# Patient Record
Sex: Female | Born: 1940 | Race: Black or African American | Hispanic: No | Marital: Married | Smoking: Former smoker
Health system: Southern US, Community
[De-identification: ages and names within clinical notes are randomized; demographics above are authoritative.]

## PROBLEM LIST (undated history)

## (undated) DIAGNOSIS — I471 Supraventricular tachycardia, unspecified: Secondary | ICD-10-CM

## (undated) DIAGNOSIS — T783XXA Angioneurotic edema, initial encounter: Secondary | ICD-10-CM

## (undated) DIAGNOSIS — I890 Lymphedema, not elsewhere classified: Secondary | ICD-10-CM

## (undated) DIAGNOSIS — R000 Tachycardia, unspecified: Secondary | ICD-10-CM

## (undated) DIAGNOSIS — J069 Acute upper respiratory infection, unspecified: Secondary | ICD-10-CM

## (undated) DIAGNOSIS — Z9889 Other specified postprocedural states: Secondary | ICD-10-CM

## (undated) DIAGNOSIS — J988 Other specified respiratory disorders: Secondary | ICD-10-CM

## (undated) DIAGNOSIS — N183 Chronic kidney disease, stage 3 unspecified: Secondary | ICD-10-CM

## (undated) DIAGNOSIS — M1990 Unspecified osteoarthritis, unspecified site: Secondary | ICD-10-CM

## (undated) DIAGNOSIS — M810 Age-related osteoporosis without current pathological fracture: Secondary | ICD-10-CM

## (undated) DIAGNOSIS — E559 Vitamin D deficiency, unspecified: Secondary | ICD-10-CM

## (undated) DIAGNOSIS — R42 Dizziness and giddiness: Secondary | ICD-10-CM

## (undated) DIAGNOSIS — E119 Type 2 diabetes mellitus without complications: Secondary | ICD-10-CM

## (undated) DIAGNOSIS — T4145XA Adverse effect of unspecified anesthetic, initial encounter: Secondary | ICD-10-CM

## (undated) DIAGNOSIS — M8580 Other specified disorders of bone density and structure, unspecified site: Secondary | ICD-10-CM

## (undated) DIAGNOSIS — L309 Dermatitis, unspecified: Secondary | ICD-10-CM

## (undated) DIAGNOSIS — K219 Gastro-esophageal reflux disease without esophagitis: Secondary | ICD-10-CM

## (undated) DIAGNOSIS — G629 Polyneuropathy, unspecified: Secondary | ICD-10-CM

## (undated) DIAGNOSIS — R202 Paresthesia of skin: Secondary | ICD-10-CM

## (undated) DIAGNOSIS — R200 Anesthesia of skin: Secondary | ICD-10-CM

## (undated) DIAGNOSIS — E7800 Pure hypercholesterolemia, unspecified: Secondary | ICD-10-CM

## (undated) DIAGNOSIS — I10 Essential (primary) hypertension: Secondary | ICD-10-CM

## (undated) DIAGNOSIS — R112 Nausea with vomiting, unspecified: Secondary | ICD-10-CM

## (undated) HISTORY — DX: Type 2 diabetes mellitus without complications: E11.9

## (undated) HISTORY — DX: Supraventricular tachycardia: I47.1

## (undated) HISTORY — DX: Age-related osteoporosis without current pathological fracture: M81.0

## (undated) HISTORY — DX: Chronic kidney disease, stage 3 (moderate): N18.3

## (undated) HISTORY — DX: Unspecified osteoarthritis, unspecified site: M19.90

## (undated) HISTORY — DX: Tachycardia, unspecified: R00.0

## (undated) HISTORY — DX: Polyneuropathy, unspecified: G62.9

## (undated) HISTORY — DX: Pure hypercholesterolemia, unspecified: E78.00

## (undated) HISTORY — DX: Acute upper respiratory infection, unspecified: J06.9

## (undated) HISTORY — DX: Lymphedema, not elsewhere classified: I89.0

## (undated) HISTORY — DX: Essential (primary) hypertension: I10

## (undated) HISTORY — DX: Chronic kidney disease, stage 3 unspecified: N18.30

## (undated) HISTORY — DX: Supraventricular tachycardia, unspecified: I47.10

## (undated) HISTORY — DX: Vitamin D deficiency, unspecified: E55.9

## (undated) HISTORY — DX: Other specified disorders of bone density and structure, unspecified site: M85.80

## (undated) HISTORY — DX: Angioneurotic edema, initial encounter: T78.3XXA

## (undated) HISTORY — PX: NASAL SINUS SURGERY: SHX719

---

## 2003-07-21 ENCOUNTER — Encounter: Admission: RE | Admit: 2003-07-21 | Discharge: 2003-10-19 | Payer: Self-pay | Admitting: Family Medicine

## 2003-10-27 ENCOUNTER — Encounter: Admission: RE | Admit: 2003-10-27 | Discharge: 2004-01-25 | Payer: Self-pay | Admitting: Family Medicine

## 2005-05-12 ENCOUNTER — Other Ambulatory Visit: Admission: RE | Admit: 2005-05-12 | Discharge: 2005-05-12 | Payer: Self-pay | Admitting: Family Medicine

## 2006-07-11 ENCOUNTER — Other Ambulatory Visit: Admission: RE | Admit: 2006-07-11 | Discharge: 2006-07-11 | Payer: Self-pay | Admitting: Family Medicine

## 2007-03-16 ENCOUNTER — Emergency Department (HOSPITAL_COMMUNITY): Admission: EM | Admit: 2007-03-16 | Discharge: 2007-03-16 | Payer: Self-pay | Admitting: Emergency Medicine

## 2007-08-11 ENCOUNTER — Emergency Department (HOSPITAL_COMMUNITY): Admission: EM | Admit: 2007-08-11 | Discharge: 2007-08-11 | Payer: Self-pay | Admitting: Emergency Medicine

## 2008-09-17 ENCOUNTER — Other Ambulatory Visit: Admission: RE | Admit: 2008-09-17 | Discharge: 2008-09-17 | Payer: Self-pay | Admitting: Family Medicine

## 2008-12-04 DIAGNOSIS — J988 Other specified respiratory disorders: Secondary | ICD-10-CM

## 2008-12-04 DIAGNOSIS — T8859XA Other complications of anesthesia, initial encounter: Secondary | ICD-10-CM

## 2008-12-04 HISTORY — DX: Other specified respiratory disorders: J98.8

## 2008-12-04 HISTORY — PX: JOINT REPLACEMENT: SHX530

## 2008-12-04 HISTORY — DX: Other complications of anesthesia, initial encounter: T88.59XA

## 2009-04-14 ENCOUNTER — Encounter: Admission: RE | Admit: 2009-04-14 | Discharge: 2009-04-14 | Payer: Self-pay | Admitting: Interventional Cardiology

## 2009-04-20 ENCOUNTER — Inpatient Hospital Stay (HOSPITAL_BASED_OUTPATIENT_CLINIC_OR_DEPARTMENT_OTHER): Admission: RE | Admit: 2009-04-20 | Discharge: 2009-04-20 | Payer: Self-pay | Admitting: Interventional Cardiology

## 2010-02-07 ENCOUNTER — Inpatient Hospital Stay (HOSPITAL_COMMUNITY): Admission: RE | Admit: 2010-02-07 | Discharge: 2010-02-12 | Payer: Self-pay | Admitting: Orthopedic Surgery

## 2010-12-04 HISTORY — PX: OTHER SURGICAL HISTORY: SHX169

## 2010-12-04 HISTORY — PX: CARDIAC CATHETERIZATION: SHX172

## 2011-02-27 LAB — CBC
HCT: 30 % — ABNORMAL LOW (ref 36.0–46.0)
HCT: 33.9 % — ABNORMAL LOW (ref 36.0–46.0)
HCT: 35 % — ABNORMAL LOW (ref 36.0–46.0)
HCT: 39.8 % (ref 36.0–46.0)
Hemoglobin: 11.6 g/dL — ABNORMAL LOW (ref 12.0–15.0)
Hemoglobin: 12 g/dL (ref 12.0–15.0)
MCHC: 33.7 g/dL (ref 30.0–36.0)
MCHC: 33.7 g/dL (ref 30.0–36.0)
MCHC: 34.3 g/dL (ref 30.0–36.0)
MCV: 84.8 fL (ref 78.0–100.0)
Platelets: 119 10*3/uL — ABNORMAL LOW (ref 150–400)
Platelets: 141 10*3/uL — ABNORMAL LOW (ref 150–400)
Platelets: 152 10*3/uL (ref 150–400)
RBC: 4 MIL/uL (ref 3.87–5.11)
RBC: 4.69 MIL/uL (ref 3.87–5.11)
RDW: 13.1 % (ref 11.5–15.5)
RDW: 13.2 % (ref 11.5–15.5)
RDW: 13.5 % (ref 11.5–15.5)
WBC: 15.3 10*3/uL — ABNORMAL HIGH (ref 4.0–10.5)

## 2011-02-27 LAB — URINALYSIS, MICROSCOPIC ONLY
Bilirubin Urine: NEGATIVE
Nitrite: NEGATIVE
Specific Gravity, Urine: 1.009 (ref 1.005–1.030)
Urobilinogen, UA: 0.2 mg/dL (ref 0.0–1.0)
pH: 5 (ref 5.0–8.0)

## 2011-02-27 LAB — BASIC METABOLIC PANEL
BUN: 21 mg/dL (ref 6–23)
BUN: 22 mg/dL (ref 6–23)
BUN: 24 mg/dL — ABNORMAL HIGH (ref 6–23)
BUN: 33 mg/dL — ABNORMAL HIGH (ref 6–23)
CO2: 25 mEq/L (ref 19–32)
CO2: 25 mEq/L (ref 19–32)
CO2: 26 mEq/L (ref 19–32)
CO2: 26 mEq/L (ref 19–32)
CO2: 26 mEq/L (ref 19–32)
Calcium: 8.4 mg/dL (ref 8.4–10.5)
Calcium: 8.6 mg/dL (ref 8.4–10.5)
Chloride: 101 mEq/L (ref 96–112)
Chloride: 105 mEq/L (ref 96–112)
Creatinine, Ser: 1.69 mg/dL — ABNORMAL HIGH (ref 0.4–1.2)
GFR calc Af Amer: 25 mL/min — ABNORMAL LOW (ref >60)
GFR calc Af Amer: 33 mL/min — ABNORMAL LOW (ref >60)
GFR calc Af Amer: 44 mL/min — ABNORMAL LOW (ref >60)
GFR calc Af Amer: 50 mL/min — ABNORMAL LOW (ref >60)
GFR calc non Af Amer: 17 mL/min — ABNORMAL LOW (ref >60)
GFR calc non Af Amer: 30 mL/min — ABNORMAL LOW (ref >60)
Glucose, Bld: 104 mg/dL — ABNORMAL HIGH (ref 70–99)
Glucose, Bld: 114 mg/dL — ABNORMAL HIGH (ref 70–99)
Glucose, Bld: 118 mg/dL — ABNORMAL HIGH (ref 70–99)
Glucose, Bld: 139 mg/dL — ABNORMAL HIGH (ref 70–99)
Potassium: 3.6 mEq/L (ref 3.5–5.1)
Potassium: 3.8 mEq/L (ref 3.5–5.1)
Potassium: 3.9 mEq/L (ref 3.5–5.1)
Sodium: 133 mEq/L — ABNORMAL LOW (ref 135–145)
Sodium: 137 mEq/L (ref 135–145)
Sodium: 138 mEq/L (ref 135–145)
Sodium: 140 mEq/L (ref 135–145)

## 2011-02-27 LAB — GLUCOSE, CAPILLARY
Glucose-Capillary: 102 mg/dL — ABNORMAL HIGH (ref 70–99)
Glucose-Capillary: 106 mg/dL — ABNORMAL HIGH (ref 70–99)
Glucose-Capillary: 107 mg/dL — ABNORMAL HIGH (ref 70–99)
Glucose-Capillary: 110 mg/dL — ABNORMAL HIGH (ref 70–99)
Glucose-Capillary: 119 mg/dL — ABNORMAL HIGH (ref 70–99)
Glucose-Capillary: 120 mg/dL — ABNORMAL HIGH (ref 70–99)
Glucose-Capillary: 121 mg/dL — ABNORMAL HIGH (ref 70–99)
Glucose-Capillary: 124 mg/dL — ABNORMAL HIGH (ref 70–99)
Glucose-Capillary: 125 mg/dL — ABNORMAL HIGH (ref 70–99)
Glucose-Capillary: 128 mg/dL — ABNORMAL HIGH (ref 70–99)
Glucose-Capillary: 130 mg/dL — ABNORMAL HIGH (ref 70–99)
Glucose-Capillary: 155 mg/dL — ABNORMAL HIGH (ref 70–99)
Glucose-Capillary: 161 mg/dL — ABNORMAL HIGH (ref 70–99)
Glucose-Capillary: 167 mg/dL — ABNORMAL HIGH (ref 70–99)
Glucose-Capillary: 197 mg/dL — ABNORMAL HIGH (ref 70–99)

## 2011-02-27 LAB — PROTIME-INR
INR: 1.56 — ABNORMAL HIGH (ref 0.00–1.49)
INR: 1.74 — ABNORMAL HIGH (ref 0.00–1.49)
Prothrombin Time: 13.5 seconds (ref 11.6–15.2)
Prothrombin Time: 14.2 seconds (ref 11.6–15.2)

## 2011-02-27 LAB — TYPE AND SCREEN
ABO/RH(D): B POS
Antibody Screen: NEGATIVE

## 2011-02-27 LAB — URINE MICROSCOPIC-ADD ON

## 2011-02-27 LAB — URINALYSIS, ROUTINE W REFLEX MICROSCOPIC: Glucose, UA: NEGATIVE mg/dL

## 2011-02-27 LAB — APTT: aPTT: 31 seconds (ref 24–37)

## 2011-02-27 LAB — DIFFERENTIAL
Eosinophils Absolute: 0.8 10*3/uL — ABNORMAL HIGH (ref 0.0–0.7)
Lymphocytes Relative: 1 % — ABNORMAL LOW (ref 12–46)
Monocytes Relative: 10 % (ref 3–12)

## 2011-03-14 LAB — POCT I-STAT GLUCOSE: Glucose, Bld: 99 mg/dL (ref 70–99)

## 2011-04-18 NOTE — Cardiovascular Report (Signed)
NAME:  Holly Fields, Holly Fields NO.:  [id number]   MEDICAL RECORD NO.:  [id number]          PATIENT TYPE:  OIB   LOCATION:  1962                         FACILITY:  [facility]   PHYSICIAN:  Lyn Records, M.D.   DATE OF BIRTH:  12-10-1940   DATE OF PROCEDURE:  04/20/2009  DATE OF DISCHARGE:  04/20/2009                            CARDIAC CATHETERIZATION   INDICATION FOR THE STUDY:  A 70 year old female, African American, with  diabetes, hypertension and hyperlipidemia who has had recent change in  exertional tolerance with easily inducible exertional dyspnea.  She has  a history of a stress Cardiolite that demonstrated probable anteroapical  ischemia.  We could not totally exclude the possibility of breast  attenuation.   PROCEDURES PERFORMED:  1. Left heart catheterization.  2. Selective coronary angiography.  3. Left ventriculography.   DESCRIPTION:  After informed consent, 1% Xylocaine was used for local  anesthesia.  A 4-French sheath was placed in the right femoral artery  using a modified Seldinger technique.  A 4-French A2 multipurpose  catheter was used for hemodynamic recordings, left ventriculography by  hand injections, and attempted coronary angiography but without success.  We then used a #4 4-French left Judkins and a #4 4-French right Judkins  catheter for coronary angiography respectively.  The patient tolerated  the procedure without complications.   RESULTS:  1. Hemodynamic data:      a.     Aortic pressure 111/60 mmHg.      b.     Left ventricular pressure 115/9 mmHg.  2. Left ventriculography:  The left ventricle is normal in size.      There is normal contractility.  The ejection fraction is 65%.  3. Coronary angiography.      a.     Left main coronary:  Normal.      b.     Left anterior descending coronary:  Normal.      c.     Circumflex coronary artery:  Large giving three obtuse       marginal branches, and normal.      d.     Right coronary:   Normal.   CONCLUSIONS:  1. Normal coronary arteries.  2. Normal left ventricular systolic function.  3. Normal left ventricular end-diastolic pressures.  4. No evidence of diastolic heart failure based upon LV hemodynamics.   RECOMMENDATIONS:  No further cardiac evaluation.  The patient's dyspnea  is probably deconditioning related.  There is no evidence of obstructive  coronary disease.  The abnormal Cardiolite study was likely due to soft  tissue artifact from breast attenuation.  Further evaluation to  determine the cause of dyspnea could include a pulmonary consultation.  No further cardiac evaluation is indicated.      Lyn Records, M.D.  Electronically Signed     HWS/MEDQ  D:  04/20/2009  T:  04/20/2009  Job:  [id number]   cc:   [name], M.D.

## 2011-08-11 ENCOUNTER — Ambulatory Visit (HOSPITAL_BASED_OUTPATIENT_CLINIC_OR_DEPARTMENT_OTHER)
Admission: RE | Admit: 2011-08-11 | Discharge: 2011-08-11 | Disposition: A | Discharge status: Home / Self Care | Payer: Medicare Other | Source: Ambulatory Visit | Attending: Orthopedic Surgery | Admitting: Orthopedic Surgery

## 2011-08-11 DIAGNOSIS — I10 Essential (primary) hypertension: Secondary | ICD-10-CM | POA: Insufficient documentation

## 2011-08-11 DIAGNOSIS — M23329 Other meniscus derangements, posterior horn of medial meniscus, unspecified knee: Secondary | ICD-10-CM | POA: Insufficient documentation

## 2011-08-11 DIAGNOSIS — K219 Gastro-esophageal reflux disease without esophagitis: Secondary | ICD-10-CM | POA: Insufficient documentation

## 2011-08-11 DIAGNOSIS — E119 Type 2 diabetes mellitus without complications: Secondary | ICD-10-CM | POA: Insufficient documentation

## 2011-08-11 DIAGNOSIS — Z96659 Presence of unspecified artificial knee joint: Secondary | ICD-10-CM | POA: Insufficient documentation

## 2011-08-11 DIAGNOSIS — M9420 Chondromalacia, unspecified site: Secondary | ICD-10-CM | POA: Insufficient documentation

## 2011-08-11 DIAGNOSIS — I890 Lymphedema, not elsewhere classified: Secondary | ICD-10-CM | POA: Insufficient documentation

## 2011-08-11 DIAGNOSIS — Z01812 Encounter for preprocedural laboratory examination: Secondary | ICD-10-CM | POA: Insufficient documentation

## 2011-08-11 DIAGNOSIS — M24669 Ankylosis, unspecified knee: Secondary | ICD-10-CM | POA: Insufficient documentation

## 2011-08-11 DIAGNOSIS — M2390 Unspecified internal derangement of unspecified knee: Secondary | ICD-10-CM | POA: Insufficient documentation

## 2011-08-11 DIAGNOSIS — M2469 Ankylosis, other specified joint: Secondary | ICD-10-CM | POA: Insufficient documentation

## 2011-08-11 LAB — POCT I-STAT, CHEM 8
BUN: 18 mg/dL (ref 6–23)
Chloride: 103 mEq/L (ref 96–112)
Creatinine, Ser: 1.3 mg/dL — ABNORMAL HIGH (ref 0.50–1.10)
Sodium: 141 mEq/L (ref 135–145)

## 2011-08-13 NOTE — Op Note (Signed)
NAME:  NGUYET, MERCER NO.:  [id number]  MEDICAL RECORD NO.:  [id number]  LOCATION:                                 FACILITY:  PHYSICIAN:  Ollen Gross, M.D.         DATE OF BIRTH:  DATE OF PROCEDURE:  08/11/2011 DATE OF DISCHARGE:                              OPERATIVE REPORT   PREOPERATIVE DIAGNOSES: 1. Left knee medial meniscal tear. 2. Right knee arthrofibrosis.  POSTOPERATIVE DIAGNOSES: 1. Left knee medial meniscal tear, plus chondral defect. 2. Right knee arthrofibrosis.  PROCEDURES: 1. Left knee arthroscopy with meniscal debridement and chondroplasty. 2. Right knee closed manipulation.  SURGEON:  Ollen Gross, M.D.  ASSISTANT:  None.  ANESTHESIA:  General.  ESTIMATED BLOOD LOSS:  Minimal.  DRAINS:  None.  COMPLICATIONS:  None.  CONDITION:  Stable to Recovery.  CLINICAL NOTE:  Holly Fields is a 70 year old female who has two major issues of the knees.  First and foremost is the significant left knee pain and mechanical symptoms.  This has been going on for several months now.  She has had cortisone injection x2 without benefit.  Exam and history suggested medial meniscal tear.  MRI scan confirmed that she has a tear.  She presents now for arthroscopy and debridement.  Secondary issue is stiffness in the right knee.  She had her right knee replaced by Dr. Turner Daniels approximately a year and half ago.  She has not achieved more than 90 to 95 degrees of flexion and has functional issues due to this.  Still it is safe to consider closed manipulation to help achieve more motion and she presents today for that procedure also.  PROCEDURE IN DETAIL:  After successful administration of general anesthetic, while the tourniquet was placed being placed on the left thigh, I did exam under anesthesia on the right knee showing range of motion about 5-90.  I then placed my chest up against the proximal tibia and flexed the knee with audible lysis of  adhesions.  I was able to easily flex to 125 degrees and then fully extend it.  Patella mobility was good.  My flex regained and again achieved 125 degrees.  The left lower extremity was then prepped and draped in the usual sterile fashion.  Standard superomedial and inferolateral incisions were made and inflow cannula passed superomedial, camera passed inferolateral.  Arthroscopic visualization proceeds.  Undersurface of the patella and trochlea looked normal.  Medial and lateral gutters were visualized with no loose bodies.  Flexion, valgus force was  applied to the knee and the medial compartment was entered.  There was evidence of a chondral defect on the medial femoral condyle as well as a large tear in the body and posterior horn of the medial meniscus.  Spinal needle was used to localize the inferomedial portal, small incision made and dilator placed.  The meniscus was debrided back to a stable base with baskets, and a 4.2-mm shaver and then sealed off with the ArthroCare device.  The shaver was then used to debride the unstable cartilage on the surface of the medial femoral condyle.  It was about a 1 x 1 cm area.  It was not full thickness.  It was stabilized.  There was no exposed bone.  The rest of the medial compartment looked normal. Intercondylar notch was visualized.  The ACL was attenuated, but not torn.  Lateral compartment was entered and it looked fairly normal. There was some mild chondromalacia, but no full-thickness defects and no unstable cartilage.  The joint was again inspected.  No other tears, defects or loose bodies were noted.  The arthroscopic equipment was then removed from the inferior portal, which I closed with interrupted 4-0 nylon.  20 mL of 0.25% Marcaine with epi was injected through the inflow cannula and that was removed and that portal closed with nylon.  A bulky sterile dressing was then applied and she was awakened and transported to Recovery in  stable condition.     Ollen Gross, M.D.     FA/MEDQ  D:  08/11/2011  T:  08/11/2011  Job:  [id number]  Electronically Signed by Ollen Gross M.D. on 08/13/2011 12:17:45 PM

## 2011-08-14 LAB — GLUCOSE, CAPILLARY
Glucose-Capillary: 134 mg/dL — ABNORMAL HIGH (ref 70–99)
Glucose-Capillary: 119 mg/dL — ABNORMAL HIGH (ref 70–99)

## 2011-12-13 DIAGNOSIS — E559 Vitamin D deficiency, unspecified: Secondary | ICD-10-CM | POA: Diagnosis not present

## 2011-12-13 DIAGNOSIS — I890 Lymphedema, not elsewhere classified: Secondary | ICD-10-CM | POA: Diagnosis not present

## 2011-12-13 DIAGNOSIS — M949 Disorder of cartilage, unspecified: Secondary | ICD-10-CM | POA: Diagnosis not present

## 2011-12-13 DIAGNOSIS — E782 Mixed hyperlipidemia: Secondary | ICD-10-CM | POA: Diagnosis not present

## 2011-12-13 DIAGNOSIS — E119 Type 2 diabetes mellitus without complications: Secondary | ICD-10-CM | POA: Diagnosis not present

## 2011-12-13 DIAGNOSIS — Z23 Encounter for immunization: Secondary | ICD-10-CM | POA: Diagnosis not present

## 2011-12-13 DIAGNOSIS — Z79899 Other long term (current) drug therapy: Secondary | ICD-10-CM | POA: Diagnosis not present

## 2011-12-13 DIAGNOSIS — M899 Disorder of bone, unspecified: Secondary | ICD-10-CM | POA: Diagnosis not present

## 2011-12-13 DIAGNOSIS — I10 Essential (primary) hypertension: Secondary | ICD-10-CM | POA: Diagnosis not present

## 2012-01-10 DIAGNOSIS — B9789 Other viral agents as the cause of diseases classified elsewhere: Secondary | ICD-10-CM | POA: Diagnosis not present

## 2012-01-11 DIAGNOSIS — M1710 Unilateral primary osteoarthritis, unspecified knee: Secondary | ICD-10-CM | POA: Diagnosis not present

## 2012-01-11 DIAGNOSIS — IMO0002 Reserved for concepts with insufficient information to code with codable children: Secondary | ICD-10-CM | POA: Diagnosis not present

## 2012-01-22 DIAGNOSIS — Z1231 Encounter for screening mammogram for malignant neoplasm of breast: Secondary | ICD-10-CM | POA: Diagnosis not present

## 2012-01-22 DIAGNOSIS — M949 Disorder of cartilage, unspecified: Secondary | ICD-10-CM | POA: Diagnosis not present

## 2012-01-22 DIAGNOSIS — M899 Disorder of bone, unspecified: Secondary | ICD-10-CM | POA: Diagnosis not present

## 2012-01-26 DIAGNOSIS — E119 Type 2 diabetes mellitus without complications: Secondary | ICD-10-CM | POA: Diagnosis not present

## 2012-02-05 DIAGNOSIS — M949 Disorder of cartilage, unspecified: Secondary | ICD-10-CM | POA: Diagnosis not present

## 2012-02-05 DIAGNOSIS — E119 Type 2 diabetes mellitus without complications: Secondary | ICD-10-CM | POA: Diagnosis not present

## 2012-02-05 DIAGNOSIS — M899 Disorder of bone, unspecified: Secondary | ICD-10-CM | POA: Diagnosis not present

## 2012-02-21 DIAGNOSIS — E119 Type 2 diabetes mellitus without complications: Secondary | ICD-10-CM | POA: Diagnosis not present

## 2012-03-08 DIAGNOSIS — E785 Hyperlipidemia, unspecified: Secondary | ICD-10-CM | POA: Diagnosis not present

## 2012-03-08 DIAGNOSIS — E669 Obesity, unspecified: Secondary | ICD-10-CM | POA: Diagnosis not present

## 2012-03-08 DIAGNOSIS — E119 Type 2 diabetes mellitus without complications: Secondary | ICD-10-CM | POA: Diagnosis not present

## 2012-03-08 DIAGNOSIS — N183 Chronic kidney disease, stage 3 unspecified: Secondary | ICD-10-CM | POA: Diagnosis not present

## 2012-03-20 DIAGNOSIS — E119 Type 2 diabetes mellitus without complications: Secondary | ICD-10-CM | POA: Diagnosis not present

## 2012-03-21 DIAGNOSIS — E119 Type 2 diabetes mellitus without complications: Secondary | ICD-10-CM | POA: Diagnosis not present

## 2012-03-25 DIAGNOSIS — IMO0002 Reserved for concepts with insufficient information to code with codable children: Secondary | ICD-10-CM | POA: Diagnosis not present

## 2012-03-25 DIAGNOSIS — M1710 Unilateral primary osteoarthritis, unspecified knee: Secondary | ICD-10-CM | POA: Diagnosis not present

## 2012-03-27 DIAGNOSIS — H04129 Dry eye syndrome of unspecified lacrimal gland: Secondary | ICD-10-CM | POA: Diagnosis not present

## 2012-04-01 DIAGNOSIS — IMO0002 Reserved for concepts with insufficient information to code with codable children: Secondary | ICD-10-CM | POA: Diagnosis not present

## 2012-04-01 DIAGNOSIS — M1710 Unilateral primary osteoarthritis, unspecified knee: Secondary | ICD-10-CM | POA: Diagnosis not present

## 2012-04-05 DIAGNOSIS — H00019 Hordeolum externum unspecified eye, unspecified eyelid: Secondary | ICD-10-CM | POA: Diagnosis not present

## 2012-04-10 DIAGNOSIS — M1710 Unilateral primary osteoarthritis, unspecified knee: Secondary | ICD-10-CM | POA: Diagnosis not present

## 2012-04-10 DIAGNOSIS — IMO0002 Reserved for concepts with insufficient information to code with codable children: Secondary | ICD-10-CM | POA: Diagnosis not present

## 2012-04-22 DIAGNOSIS — E1139 Type 2 diabetes mellitus with other diabetic ophthalmic complication: Secondary | ICD-10-CM | POA: Diagnosis not present

## 2012-05-23 DIAGNOSIS — I890 Lymphedema, not elsewhere classified: Secondary | ICD-10-CM | POA: Diagnosis not present

## 2012-05-23 DIAGNOSIS — J309 Allergic rhinitis, unspecified: Secondary | ICD-10-CM | POA: Diagnosis not present

## 2012-05-23 DIAGNOSIS — I10 Essential (primary) hypertension: Secondary | ICD-10-CM | POA: Diagnosis not present

## 2012-05-28 DIAGNOSIS — M1710 Unilateral primary osteoarthritis, unspecified knee: Secondary | ICD-10-CM | POA: Diagnosis not present

## 2012-05-28 DIAGNOSIS — IMO0002 Reserved for concepts with insufficient information to code with codable children: Secondary | ICD-10-CM | POA: Diagnosis not present

## 2012-06-07 DIAGNOSIS — I890 Lymphedema, not elsewhere classified: Secondary | ICD-10-CM | POA: Diagnosis not present

## 2012-06-10 DIAGNOSIS — E119 Type 2 diabetes mellitus without complications: Secondary | ICD-10-CM | POA: Diagnosis not present

## 2012-06-10 DIAGNOSIS — E785 Hyperlipidemia, unspecified: Secondary | ICD-10-CM | POA: Diagnosis not present

## 2012-06-12 DIAGNOSIS — N183 Chronic kidney disease, stage 3 unspecified: Secondary | ICD-10-CM | POA: Diagnosis not present

## 2012-06-12 DIAGNOSIS — E669 Obesity, unspecified: Secondary | ICD-10-CM | POA: Diagnosis not present

## 2012-06-12 DIAGNOSIS — E119 Type 2 diabetes mellitus without complications: Secondary | ICD-10-CM | POA: Diagnosis not present

## 2012-06-12 DIAGNOSIS — E785 Hyperlipidemia, unspecified: Secondary | ICD-10-CM | POA: Diagnosis not present

## 2012-07-19 DIAGNOSIS — G44209 Tension-type headache, unspecified, not intractable: Secondary | ICD-10-CM | POA: Diagnosis not present

## 2012-07-19 DIAGNOSIS — J309 Allergic rhinitis, unspecified: Secondary | ICD-10-CM | POA: Diagnosis not present

## 2012-08-01 ENCOUNTER — Other Ambulatory Visit (HOSPITAL_COMMUNITY)
Admission: RE | Admit: 2012-08-01 | Discharge: 2012-08-01 | Disposition: A | Discharge status: Home / Self Care | Payer: Medicare Other | Source: Ambulatory Visit | Attending: Family Medicine | Admitting: Family Medicine

## 2012-08-01 DIAGNOSIS — E119 Type 2 diabetes mellitus without complications: Secondary | ICD-10-CM | POA: Diagnosis not present

## 2012-08-01 DIAGNOSIS — Z01419 Encounter for gynecological examination (general) (routine) without abnormal findings: Secondary | ICD-10-CM | POA: Diagnosis not present

## 2012-08-01 DIAGNOSIS — Z0000 Encounter for general adult medical examination without abnormal findings: Secondary | ICD-10-CM | POA: Diagnosis not present

## 2012-08-01 DIAGNOSIS — Z124 Encounter for screening for malignant neoplasm of cervix: Secondary | ICD-10-CM | POA: Insufficient documentation

## 2012-08-01 DIAGNOSIS — I890 Lymphedema, not elsewhere classified: Secondary | ICD-10-CM | POA: Diagnosis not present

## 2012-08-26 DIAGNOSIS — M5137 Other intervertebral disc degeneration, lumbosacral region: Secondary | ICD-10-CM | POA: Diagnosis not present

## 2012-08-26 DIAGNOSIS — M999 Biomechanical lesion, unspecified: Secondary | ICD-10-CM | POA: Diagnosis not present

## 2012-09-04 DIAGNOSIS — M5137 Other intervertebral disc degeneration, lumbosacral region: Secondary | ICD-10-CM | POA: Diagnosis not present

## 2012-09-04 DIAGNOSIS — M999 Biomechanical lesion, unspecified: Secondary | ICD-10-CM | POA: Diagnosis not present

## 2012-09-05 DIAGNOSIS — M5137 Other intervertebral disc degeneration, lumbosacral region: Secondary | ICD-10-CM | POA: Diagnosis not present

## 2012-09-05 DIAGNOSIS — M999 Biomechanical lesion, unspecified: Secondary | ICD-10-CM | POA: Diagnosis not present

## 2012-09-09 DIAGNOSIS — M999 Biomechanical lesion, unspecified: Secondary | ICD-10-CM | POA: Diagnosis not present

## 2012-09-09 DIAGNOSIS — M5137 Other intervertebral disc degeneration, lumbosacral region: Secondary | ICD-10-CM | POA: Diagnosis not present

## 2012-09-10 DIAGNOSIS — M999 Biomechanical lesion, unspecified: Secondary | ICD-10-CM | POA: Diagnosis not present

## 2012-09-10 DIAGNOSIS — M5137 Other intervertebral disc degeneration, lumbosacral region: Secondary | ICD-10-CM | POA: Diagnosis not present

## 2012-09-12 DIAGNOSIS — M5137 Other intervertebral disc degeneration, lumbosacral region: Secondary | ICD-10-CM | POA: Diagnosis not present

## 2012-09-12 DIAGNOSIS — Z23 Encounter for immunization: Secondary | ICD-10-CM | POA: Diagnosis not present

## 2012-09-12 DIAGNOSIS — M999 Biomechanical lesion, unspecified: Secondary | ICD-10-CM | POA: Diagnosis not present

## 2012-09-16 DIAGNOSIS — M999 Biomechanical lesion, unspecified: Secondary | ICD-10-CM | POA: Diagnosis not present

## 2012-09-16 DIAGNOSIS — M5137 Other intervertebral disc degeneration, lumbosacral region: Secondary | ICD-10-CM | POA: Diagnosis not present

## 2012-09-17 DIAGNOSIS — M999 Biomechanical lesion, unspecified: Secondary | ICD-10-CM | POA: Diagnosis not present

## 2012-09-17 DIAGNOSIS — M5137 Other intervertebral disc degeneration, lumbosacral region: Secondary | ICD-10-CM | POA: Diagnosis not present

## 2012-09-19 DIAGNOSIS — M999 Biomechanical lesion, unspecified: Secondary | ICD-10-CM | POA: Diagnosis not present

## 2012-09-19 DIAGNOSIS — M5137 Other intervertebral disc degeneration, lumbosacral region: Secondary | ICD-10-CM | POA: Diagnosis not present

## 2012-09-23 DIAGNOSIS — M999 Biomechanical lesion, unspecified: Secondary | ICD-10-CM | POA: Diagnosis not present

## 2012-09-23 DIAGNOSIS — M5137 Other intervertebral disc degeneration, lumbosacral region: Secondary | ICD-10-CM | POA: Diagnosis not present

## 2012-09-24 DIAGNOSIS — M999 Biomechanical lesion, unspecified: Secondary | ICD-10-CM | POA: Diagnosis not present

## 2012-09-24 DIAGNOSIS — M5137 Other intervertebral disc degeneration, lumbosacral region: Secondary | ICD-10-CM | POA: Diagnosis not present

## 2012-09-26 DIAGNOSIS — M5137 Other intervertebral disc degeneration, lumbosacral region: Secondary | ICD-10-CM | POA: Diagnosis not present

## 2012-09-26 DIAGNOSIS — M999 Biomechanical lesion, unspecified: Secondary | ICD-10-CM | POA: Diagnosis not present

## 2012-10-07 DIAGNOSIS — M999 Biomechanical lesion, unspecified: Secondary | ICD-10-CM | POA: Diagnosis not present

## 2012-10-07 DIAGNOSIS — M5137 Other intervertebral disc degeneration, lumbosacral region: Secondary | ICD-10-CM | POA: Diagnosis not present

## 2012-10-08 DIAGNOSIS — M5137 Other intervertebral disc degeneration, lumbosacral region: Secondary | ICD-10-CM | POA: Diagnosis not present

## 2012-10-08 DIAGNOSIS — M999 Biomechanical lesion, unspecified: Secondary | ICD-10-CM | POA: Diagnosis not present

## 2012-10-10 DIAGNOSIS — M5137 Other intervertebral disc degeneration, lumbosacral region: Secondary | ICD-10-CM | POA: Diagnosis not present

## 2012-10-10 DIAGNOSIS — M999 Biomechanical lesion, unspecified: Secondary | ICD-10-CM | POA: Diagnosis not present

## 2012-10-14 DIAGNOSIS — M999 Biomechanical lesion, unspecified: Secondary | ICD-10-CM | POA: Diagnosis not present

## 2012-10-14 DIAGNOSIS — M5137 Other intervertebral disc degeneration, lumbosacral region: Secondary | ICD-10-CM | POA: Diagnosis not present

## 2012-10-16 DIAGNOSIS — M999 Biomechanical lesion, unspecified: Secondary | ICD-10-CM | POA: Diagnosis not present

## 2012-10-16 DIAGNOSIS — M5137 Other intervertebral disc degeneration, lumbosacral region: Secondary | ICD-10-CM | POA: Diagnosis not present

## 2012-10-21 DIAGNOSIS — M5137 Other intervertebral disc degeneration, lumbosacral region: Secondary | ICD-10-CM | POA: Diagnosis not present

## 2012-10-21 DIAGNOSIS — M999 Biomechanical lesion, unspecified: Secondary | ICD-10-CM | POA: Diagnosis not present

## 2012-10-23 DIAGNOSIS — M999 Biomechanical lesion, unspecified: Secondary | ICD-10-CM | POA: Diagnosis not present

## 2012-10-23 DIAGNOSIS — M5137 Other intervertebral disc degeneration, lumbosacral region: Secondary | ICD-10-CM | POA: Diagnosis not present

## 2012-10-28 DIAGNOSIS — M5137 Other intervertebral disc degeneration, lumbosacral region: Secondary | ICD-10-CM | POA: Diagnosis not present

## 2012-10-28 DIAGNOSIS — M999 Biomechanical lesion, unspecified: Secondary | ICD-10-CM | POA: Diagnosis not present

## 2012-12-17 DIAGNOSIS — E785 Hyperlipidemia, unspecified: Secondary | ICD-10-CM | POA: Diagnosis not present

## 2012-12-17 DIAGNOSIS — E559 Vitamin D deficiency, unspecified: Secondary | ICD-10-CM | POA: Diagnosis not present

## 2012-12-17 DIAGNOSIS — N183 Chronic kidney disease, stage 3 unspecified: Secondary | ICD-10-CM | POA: Diagnosis not present

## 2012-12-17 DIAGNOSIS — E119 Type 2 diabetes mellitus without complications: Secondary | ICD-10-CM | POA: Diagnosis not present

## 2012-12-19 DIAGNOSIS — E119 Type 2 diabetes mellitus without complications: Secondary | ICD-10-CM | POA: Diagnosis not present

## 2012-12-19 DIAGNOSIS — E669 Obesity, unspecified: Secondary | ICD-10-CM | POA: Diagnosis not present

## 2012-12-19 DIAGNOSIS — E785 Hyperlipidemia, unspecified: Secondary | ICD-10-CM | POA: Diagnosis not present

## 2012-12-19 DIAGNOSIS — N183 Chronic kidney disease, stage 3 unspecified: Secondary | ICD-10-CM | POA: Diagnosis not present

## 2013-01-13 DIAGNOSIS — Z23 Encounter for immunization: Secondary | ICD-10-CM | POA: Diagnosis not present

## 2013-01-22 DIAGNOSIS — R42 Dizziness and giddiness: Secondary | ICD-10-CM | POA: Diagnosis not present

## 2013-01-22 DIAGNOSIS — I10 Essential (primary) hypertension: Secondary | ICD-10-CM | POA: Diagnosis not present

## 2013-01-23 DIAGNOSIS — Z1231 Encounter for screening mammogram for malignant neoplasm of breast: Secondary | ICD-10-CM | POA: Diagnosis not present

## 2013-01-30 DIAGNOSIS — H6120 Impacted cerumen, unspecified ear: Secondary | ICD-10-CM | POA: Diagnosis not present

## 2013-01-30 DIAGNOSIS — R42 Dizziness and giddiness: Secondary | ICD-10-CM | POA: Diagnosis not present

## 2013-01-30 DIAGNOSIS — J301 Allergic rhinitis due to pollen: Secondary | ICD-10-CM | POA: Diagnosis not present

## 2013-01-30 DIAGNOSIS — J329 Chronic sinusitis, unspecified: Secondary | ICD-10-CM | POA: Diagnosis not present

## 2013-01-30 DIAGNOSIS — H8110 Benign paroxysmal vertigo, unspecified ear: Secondary | ICD-10-CM | POA: Diagnosis not present

## 2013-01-30 DIAGNOSIS — J310 Chronic rhinitis: Secondary | ICD-10-CM | POA: Diagnosis not present

## 2013-04-30 DIAGNOSIS — E119 Type 2 diabetes mellitus without complications: Secondary | ICD-10-CM | POA: Diagnosis not present

## 2013-05-07 DIAGNOSIS — M1710 Unilateral primary osteoarthritis, unspecified knee: Secondary | ICD-10-CM | POA: Diagnosis not present

## 2013-05-07 DIAGNOSIS — M5137 Other intervertebral disc degeneration, lumbosacral region: Secondary | ICD-10-CM | POA: Diagnosis not present

## 2013-06-03 DIAGNOSIS — M48061 Spinal stenosis, lumbar region without neurogenic claudication: Secondary | ICD-10-CM | POA: Diagnosis not present

## 2013-06-07 DIAGNOSIS — M48061 Spinal stenosis, lumbar region without neurogenic claudication: Secondary | ICD-10-CM | POA: Diagnosis not present

## 2013-06-16 DIAGNOSIS — M48061 Spinal stenosis, lumbar region without neurogenic claudication: Secondary | ICD-10-CM | POA: Diagnosis not present

## 2013-06-18 DIAGNOSIS — IMO0002 Reserved for concepts with insufficient information to code with codable children: Secondary | ICD-10-CM | POA: Diagnosis not present

## 2013-06-18 DIAGNOSIS — M1710 Unilateral primary osteoarthritis, unspecified knee: Secondary | ICD-10-CM | POA: Diagnosis not present

## 2013-06-19 DIAGNOSIS — E119 Type 2 diabetes mellitus without complications: Secondary | ICD-10-CM | POA: Diagnosis not present

## 2013-06-19 DIAGNOSIS — E785 Hyperlipidemia, unspecified: Secondary | ICD-10-CM | POA: Diagnosis not present

## 2013-06-19 DIAGNOSIS — E669 Obesity, unspecified: Secondary | ICD-10-CM | POA: Diagnosis not present

## 2013-06-19 DIAGNOSIS — N183 Chronic kidney disease, stage 3 unspecified: Secondary | ICD-10-CM | POA: Diagnosis not present

## 2013-06-23 DIAGNOSIS — IMO0002 Reserved for concepts with insufficient information to code with codable children: Secondary | ICD-10-CM | POA: Diagnosis not present

## 2013-06-23 DIAGNOSIS — M1710 Unilateral primary osteoarthritis, unspecified knee: Secondary | ICD-10-CM | POA: Diagnosis not present

## 2013-06-24 DIAGNOSIS — M545 Low back pain, unspecified: Secondary | ICD-10-CM | POA: Diagnosis not present

## 2013-06-25 DIAGNOSIS — IMO0002 Reserved for concepts with insufficient information to code with codable children: Secondary | ICD-10-CM | POA: Diagnosis not present

## 2013-06-25 DIAGNOSIS — M1710 Unilateral primary osteoarthritis, unspecified knee: Secondary | ICD-10-CM | POA: Diagnosis not present

## 2013-06-26 DIAGNOSIS — M1710 Unilateral primary osteoarthritis, unspecified knee: Secondary | ICD-10-CM | POA: Diagnosis not present

## 2013-06-26 DIAGNOSIS — IMO0002 Reserved for concepts with insufficient information to code with codable children: Secondary | ICD-10-CM | POA: Diagnosis not present

## 2013-06-30 DIAGNOSIS — M48061 Spinal stenosis, lumbar region without neurogenic claudication: Secondary | ICD-10-CM | POA: Diagnosis not present

## 2013-07-01 DIAGNOSIS — IMO0002 Reserved for concepts with insufficient information to code with codable children: Secondary | ICD-10-CM | POA: Diagnosis not present

## 2013-07-01 DIAGNOSIS — M1710 Unilateral primary osteoarthritis, unspecified knee: Secondary | ICD-10-CM | POA: Diagnosis not present

## 2013-07-09 DIAGNOSIS — IMO0002 Reserved for concepts with insufficient information to code with codable children: Secondary | ICD-10-CM | POA: Diagnosis not present

## 2013-07-09 DIAGNOSIS — M1710 Unilateral primary osteoarthritis, unspecified knee: Secondary | ICD-10-CM | POA: Diagnosis not present

## 2013-07-10 DIAGNOSIS — M1710 Unilateral primary osteoarthritis, unspecified knee: Secondary | ICD-10-CM | POA: Diagnosis not present

## 2013-07-10 DIAGNOSIS — IMO0002 Reserved for concepts with insufficient information to code with codable children: Secondary | ICD-10-CM | POA: Diagnosis not present

## 2013-07-14 DIAGNOSIS — M1710 Unilateral primary osteoarthritis, unspecified knee: Secondary | ICD-10-CM | POA: Diagnosis not present

## 2013-07-14 DIAGNOSIS — IMO0002 Reserved for concepts with insufficient information to code with codable children: Secondary | ICD-10-CM | POA: Diagnosis not present

## 2013-07-21 DIAGNOSIS — M1710 Unilateral primary osteoarthritis, unspecified knee: Secondary | ICD-10-CM | POA: Diagnosis not present

## 2013-07-21 DIAGNOSIS — IMO0002 Reserved for concepts with insufficient information to code with codable children: Secondary | ICD-10-CM | POA: Diagnosis not present

## 2013-07-23 DIAGNOSIS — M1710 Unilateral primary osteoarthritis, unspecified knee: Secondary | ICD-10-CM | POA: Diagnosis not present

## 2013-07-23 DIAGNOSIS — IMO0002 Reserved for concepts with insufficient information to code with codable children: Secondary | ICD-10-CM | POA: Diagnosis not present

## 2013-07-28 DIAGNOSIS — IMO0002 Reserved for concepts with insufficient information to code with codable children: Secondary | ICD-10-CM | POA: Diagnosis not present

## 2013-07-28 DIAGNOSIS — M1710 Unilateral primary osteoarthritis, unspecified knee: Secondary | ICD-10-CM | POA: Diagnosis not present

## 2013-08-05 DIAGNOSIS — I890 Lymphedema, not elsewhere classified: Secondary | ICD-10-CM | POA: Diagnosis not present

## 2013-08-05 DIAGNOSIS — L989 Disorder of the skin and subcutaneous tissue, unspecified: Secondary | ICD-10-CM | POA: Diagnosis not present

## 2013-08-05 DIAGNOSIS — E782 Mixed hyperlipidemia: Secondary | ICD-10-CM | POA: Diagnosis not present

## 2013-08-05 DIAGNOSIS — Z23 Encounter for immunization: Secondary | ICD-10-CM | POA: Diagnosis not present

## 2013-08-05 DIAGNOSIS — Z79899 Other long term (current) drug therapy: Secondary | ICD-10-CM | POA: Diagnosis not present

## 2013-08-05 DIAGNOSIS — Z0000 Encounter for general adult medical examination without abnormal findings: Secondary | ICD-10-CM | POA: Diagnosis not present

## 2013-08-05 DIAGNOSIS — E559 Vitamin D deficiency, unspecified: Secondary | ICD-10-CM | POA: Diagnosis not present

## 2013-08-12 DIAGNOSIS — IMO0002 Reserved for concepts with insufficient information to code with codable children: Secondary | ICD-10-CM | POA: Diagnosis not present

## 2013-08-12 DIAGNOSIS — M1710 Unilateral primary osteoarthritis, unspecified knee: Secondary | ICD-10-CM | POA: Diagnosis not present

## 2013-08-20 DIAGNOSIS — M48061 Spinal stenosis, lumbar region without neurogenic claudication: Secondary | ICD-10-CM | POA: Diagnosis not present

## 2013-08-29 DIAGNOSIS — I10 Essential (primary) hypertension: Secondary | ICD-10-CM | POA: Diagnosis not present

## 2013-08-29 DIAGNOSIS — E782 Mixed hyperlipidemia: Secondary | ICD-10-CM | POA: Diagnosis not present

## 2013-08-29 DIAGNOSIS — Z01810 Encounter for preprocedural cardiovascular examination: Secondary | ICD-10-CM | POA: Diagnosis not present

## 2013-08-29 DIAGNOSIS — N183 Chronic kidney disease, stage 3 unspecified: Secondary | ICD-10-CM | POA: Diagnosis not present

## 2013-08-29 DIAGNOSIS — E119 Type 2 diabetes mellitus without complications: Secondary | ICD-10-CM | POA: Diagnosis not present

## 2013-09-05 DIAGNOSIS — L738 Other specified follicular disorders: Secondary | ICD-10-CM | POA: Diagnosis not present

## 2013-09-18 ENCOUNTER — Encounter: Payer: Self-pay | Admitting: *Deleted

## 2013-09-18 ENCOUNTER — Encounter: Payer: Self-pay | Admitting: Interventional Cardiology

## 2013-09-22 ENCOUNTER — Ambulatory Visit (INDEPENDENT_AMBULATORY_CARE_PROVIDER_SITE_OTHER): Payer: Medicare Other | Admitting: Interventional Cardiology

## 2013-09-22 ENCOUNTER — Encounter: Payer: Self-pay | Admitting: Interventional Cardiology

## 2013-09-22 VITALS — BP 120/74 | HR 68 | Ht 61.0 in | Wt 190.0 lb

## 2013-09-22 DIAGNOSIS — Z01810 Encounter for preprocedural cardiovascular examination: Secondary | ICD-10-CM | POA: Diagnosis not present

## 2013-09-22 DIAGNOSIS — I10 Essential (primary) hypertension: Secondary | ICD-10-CM | POA: Diagnosis not present

## 2013-09-22 DIAGNOSIS — R198 Other specified symptoms and signs involving the digestive system and abdomen: Secondary | ICD-10-CM | POA: Diagnosis not present

## 2013-09-22 DIAGNOSIS — IMO0001 Reserved for inherently not codable concepts without codable children: Secondary | ICD-10-CM | POA: Diagnosis not present

## 2013-09-22 DIAGNOSIS — E785 Hyperlipidemia, unspecified: Secondary | ICD-10-CM

## 2013-09-22 DIAGNOSIS — R141 Gas pain: Secondary | ICD-10-CM | POA: Diagnosis not present

## 2013-09-22 NOTE — Progress Notes (Signed)
Patient ID: Holly Fields, female   DOB: [birth year], 72 y.o.   MRN: [id number]    [address] [place], [place]  [address] Phone: [phone] Fax:  [phone]  Date:  09/22/2013   ID:  Holly Fields May 16, 1941, MRN [id number]  PCP:  No primary provider on file. Dr. Shelle Iron   ASSESSMENT:  1. Pre-operative CV evaluation  2. Hyperension  3. Lymphemdema  4. Chronic Kidney disease  5. Diabetes mellitus  PLAN:  1. Cleared for upcoming orthopedic/spine surgery with Dr. Shelle Iron  2. No cardiac evaluation is indicated   SUBJECTIVE: Holly Fields is a 72 y.o. female who is to undergo lumbar spine surgery by Dr. Shelle Iron. She is referred for cardiac clearance by Dr. Zachery Dauer, her primary care physician. [name] has no history of heart disease. She underwent coronary angiography in 2008. At that time she was having exertional dyspnea and a myocardial perfusion study demonstrated an anterior defect. Coronary angiography was performed revealing normal coronary arteries. The perfusion defect was felt to represent an attenuation artifact.  She has no current cardiac complaints. Exertional tolerance is quite limited due to bilateral knee and lumbar spine ailments. She specifically denies orthopnea, PND, chest discomfort, palpitations, syncope, lower extremity swelling, DVT, pulmonary emboli, and other significant cardiopulmonary problems.   Wt Readings from Last 3 Encounters:  09/22/13 190 lb (86.183 kg)     Past Medical History  Diagnosis Date  . Vitamin D deficiency   . Hypercholesteremia   . HTN (hypertension)   . PVD (peripheral vascular disease)   . Lymphedema of leg   . Osteopenia   . Edema   . Dyspnea   . Diabetes   . 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 Lowell Guitar)    Current Outpatient Prescriptions  Medication Sig Dispense Refill  . aspirin 81 MG tablet Take 81 mg by mouth daily.      [name] Kitchen atorvastatin (LIPITOR)  10 MG tablet Take 10 mg by mouth daily.      . Cholecalciferol (VITAMIN D3 PO) Take 4,000 Units by mouth 2 (two) times daily.      . fluticasone (FLONASE) 50 MCG/ACT nasal spray Place 2 sprays into the nose daily.      . hydrochlorothiazide (HYDRODIURIL) 25 MG tablet Take 25 mg by mouth daily.      [name] Kitchen JANUVIA 50 MG tablet       . KRILL OIL PO Take 500 mg by mouth daily.       . lansoprazole (PREVACID) 15 MG capsule Take 15 mg by mouth daily.      [name] Kitchen lisinopril (PRINIVIL,ZESTRIL) 5 MG tablet Take 5 mg by mouth daily.       No current facility-administered medications for this visit.    Allergies:    Allergies  Allergen Reactions  . Adenosine      bp plummets  . Augmentin [Amoxicillin-Pot Clavulanate]   . Codeine   . Doxycycline     rosacea  . Metformin And Related     diarrhea  . Tramadol     Social History:  The patient  reports that she has quit smoking. She does not have any smokeless tobacco history on file.   ROS:  Please see the history of present illness.   She denies transient neurological symptoms, wheezing, hemoptysis, hematemesis, weight loss, anorexia, thyroid disease, unexplained weight loss.   All other systems reviewed and negative.   OBJECTIVE:  VS:  BP 120/74  Pulse 68  Ht 5\' 1"  (1.549 m)  Wt 190 lb (86.183 kg)  BMI 35.92 kg/m2  SpO2 97% Well nourished, well developed, in no acute distress, obese. HEENT: normal Neck: JVD none. Carotid bruit none  Cardiac:  normal S1, S2; RRR; no murmur Lungs:  clear to auscultation bilaterally, no wheezing, rhonchi or rales Abd: soft, nontender, no hepatomegaly Ext: Edema bilateral 2-3+, related to lymphedema. Pulses bilateral trace pedal Skin: warm and dry Neuro:  CNs 2-12 intact, no focal abnormalities noted  EKG:  Normal       Signed, Darci Needle III, MD 09/22/2013 2:28 PM

## 2013-09-22 NOTE — Patient Instructions (Signed)
Your physician recommends that you continue on your current medications as directed. Please refer to the Current Medication list given to you today.  Your physician recommends that you follow-up as needed.  Patient is cleared for surgery from a Cardiac perspective.

## 2013-09-25 ENCOUNTER — Telehealth: Payer: Self-pay | Admitting: Interventional Cardiology

## 2013-09-25 NOTE — Telephone Encounter (Signed)
New Problem:  Pt states she needs a tangible document stating she is cleared for surgery faxed to [facility] with Dr. Rupert Stacks attention.   Fax # is below  [phone]

## 2013-09-25 NOTE — Telephone Encounter (Signed)
Routed Dr.Smith office note for 09/22/13 that documents that patient is cleared for surgery from a cardiac standpoint

## 2013-09-30 ENCOUNTER — Other Ambulatory Visit: Payer: Self-pay | Admitting: Orthopedic Surgery

## 2013-10-03 DIAGNOSIS — R198 Other specified symptoms and signs involving the digestive system and abdomen: Secondary | ICD-10-CM | POA: Diagnosis not present

## 2013-10-06 ENCOUNTER — Other Ambulatory Visit: Payer: Self-pay | Admitting: Orthopedic Surgery

## 2013-10-06 NOTE — H&P (Signed)
Holly Fields is an 71 y.o. female.   Chief Complaint: back and leg pain HPI: The patient is a 71 year old female being followed for their back symptoms, ongoing since May 2014. Symptoms reported today include: pain (bilateral legs especially at night). The patient states that they are doing poorly. Current treatment includes: activity modification and NSAIDs. The following medication has been used for pain control: antiinflammatory medication (advil or aleve). The patient reports their current pain level to be mild and 6-7 / 10 (right now but gets worse qhs). The patient has reported improvement of their symptoms with: activity modification, Cortisone injections (esi 06/24/2013, short term relief 90%) and physical therapy (goc).  She has pain with standing, better with sitting, although not completely relieved.  Past Medical History  Diagnosis Date  . Vitamin D deficiency   . Hypercholesteremia   . HTN (hypertension)   . PVD (peripheral vascular disease)   . Lymphedema of leg   . Osteopenia   . Edema   . Dyspnea   . Diabetes   . 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 Powell)    No past surgical history on file.  Family History  Problem Relation Age of Onset  . Arrhythmia Father   . Arrhythmia Mother   . Heart attack Father   . Heart failure Father   . Hyperlipidemia Mother   . Hypertension Mother    Social History:  reports that she has quit smoking. She does not have any smokeless tobacco history on file. Her alcohol and drug histories are not on file.  Allergies:  Allergies  Allergen Reactions  . Adenosine      bp plummets  . Augmentin [Amoxicillin-Pot Clavulanate]   . Codeine   . Doxycycline     rosacea  . Metformin And Related     diarrhea  . Tramadol      (Not in a hospital admission)  No results found for this or any previous visit (from the past 48 hour(s)). No results found.  Review of Systems   Constitutional: Negative.   HENT: Negative.   Eyes: Negative.   Respiratory: Negative.   Cardiovascular: Negative.   Gastrointestinal: Negative.   Genitourinary: Negative.   Musculoskeletal: Positive for back pain.  Skin: Negative.   Neurological: Negative.   Endo/Heme/Allergies: Negative.   Psychiatric/Behavioral: Negative.     There were no vitals taken for this visit. Physical Exam  Constitutional: She is oriented to person, place, and time. She appears well-developed and well-nourished.  HENT:  Head: Normocephalic and atraumatic.  Eyes: Conjunctivae and EOM are normal. Pupils are equal, round, and reactive to light.  Neck: Normal range of motion. Neck supple.  Cardiovascular: Normal rate and regular rhythm.   Respiratory: Effort normal and breath sounds normal.  GI: Soft. Bowel sounds are normal.  Musculoskeletal:  Straight leg raise produces buttock, thigh and calf pain. Femoral stretch is positive as well. Quadriceps weakness is negative bilaterally as well as hip flexor weakness.  Lumbar spine exam reveals no evidence of soft tissue swelling, no evidence of soft tissue swelling or deformity or skin ecchymosis. On palpation there is no tenderness of the lumbar spine. No flank pain with percussion. The abdomen is soft and nontender. Nontender over the trochanters. No cellulitis or lymphadenopathy.  Motor is 5/5 including EHL, tibialis anterior, plantarflexion, quadriceps and hamstrings. The patient is normoreflexic. There is no Babinski or clonus. Sensory exam is intact to light touch.   The patient has good distal pulses. No DVT. No pain and normal range of motion without instability of the hips, knees and ankles.  Neurological: She is alert and oriented to person, place, and time. She has normal reflexes.  Skin: Skin is warm and dry.  Psychiatric: She has a normal mood and affect.    MRI indicates severe stenosis at 3-4. They report a slight listhesis. Central disc  protrusion. Moderate stenosis on the right at 4-5, mild to moderate on the left. Foraminal cyst on the left. Degenerative changes at 2-3.  Three view radiographs, mild degenerative changes. No instability in flexion and extension.  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 The main problem is severe stenosis at L3-4, refractory.  Given that she is refractory to conservative treatment, neurogenic claudication and severe limitation of her ambulatory capacity and motor weakness we discussed lumbar decompression at L2-3 and L3-4. We discussed specifically the possibility of a CSF leak, ongoing symptoms related to her multi-level disk degeneration but the majority of her stenosis is noted. I do not feel there is any instability noted. We discussed the perioperative course in detail. She will undergo a preoperative clearance.  I had an extensive discussion of the risks and benefits of the lumbar decompression with the patient including bleeding, infection, damage to neurovascular structures, epidural fibrosis, CSF leak requiring repair. We also discussed increase in pain, adjacent segment disease, recurrent disc herniation, need for future surgery including repeat decompression and/or fusion. We also discussed risks of postoperative hematoma, paralysis, anesthetic complications including DVT, PE, death, cardiopulmonary dysfunction. In addition, the perioperative and postoperative courses were discussed in detail including the rehabilitative time and return to functional activity and work. I provided the patient with an illustrated handout and utilized the appropriate surgical models.  Plan microlumbar decompression L2-3, L3-4  Fields, Holly M. for Dr. Beane 10/06/2013, 8:32 AM    

## 2013-10-14 ENCOUNTER — Encounter (HOSPITAL_COMMUNITY): Payer: Self-pay | Admitting: Pharmacy Technician

## 2013-10-15 ENCOUNTER — Other Ambulatory Visit (HOSPITAL_COMMUNITY): Payer: Self-pay | Admitting: Specialist

## 2013-10-15 NOTE — Progress Notes (Addendum)
ekg 09-22-13 epic cardiac clearance note dr Katrinka Blazing 09-22-13 epic

## 2013-10-15 NOTE — Patient Instructions (Addendum)
[address] [place]HANTE CASSELL  10/15/2013   Your procedure is scheduled on: Wednesday November 19th  Report to [facility] at 630  AM.  Call this number if you have problems the morning of surgery [phone]   Remember:   Do not eat food or drink liquids :After Midnight.     Take these medicines the morning of surgery with A SIP OF WATER: atorvastatin, allegra if needed, lanspprazole                                SEE [facility] PREPARING FOR SURGERY SHEET             You may not have any metal on your body including hair pins and piercings  Do not wear jewelry, make-up.  Do not wear lotions, powders, or perfumes. You may wear deodorant.   Men may shave face and neck.  Do not bring valuables to the hospital. [facility] IS NOT RESPONSIBLE FOR VALUEABLES.  Contacts, dentures or bridgework may not be worn into surgery.  Leave suitcase in the car. After surgery it may be brought to your room.  For patients admitted to the hospital, checkout time is 11:00 AM the day of discharge.    Please read over the following fact sheets that you were given: [facility] information, incentive spirometer sheet.  Call Cain Sieve RN pre op nurse if needed [phone]    FAILURE TO FOLLOW THESE INSTRUCTIONS MAY RESULT IN THE CANCELLATION OF YOUR SURGERY.  PATIENT SIGNATURE___________________________________________  NURSE SIGNATURE_____________________________________________

## 2013-10-16 ENCOUNTER — Ambulatory Visit (HOSPITAL_COMMUNITY)
Admission: RE | Admit: 2013-10-16 | Discharge: 2013-10-16 | Disposition: A | Discharge status: Home / Self Care | Payer: Medicare Other | Source: Ambulatory Visit | Attending: Orthopedic Surgery | Admitting: Orthopedic Surgery

## 2013-10-16 ENCOUNTER — Ambulatory Visit (HOSPITAL_COMMUNITY)
Admission: RE | Admit: 2013-10-16 | Discharge: 2013-10-16 | Disposition: A | Discharge status: Home / Self Care | Payer: Medicare Other | Source: Ambulatory Visit | Attending: Specialist | Admitting: Specialist

## 2013-10-16 ENCOUNTER — Encounter (HOSPITAL_COMMUNITY): Payer: Self-pay

## 2013-10-16 ENCOUNTER — Encounter (HOSPITAL_COMMUNITY)
Admission: RE | Admit: 2013-10-16 | Discharge: 2013-10-16 | Disposition: A | Discharge status: Home / Self Care | Payer: Medicare Other | Source: Ambulatory Visit | Attending: Specialist | Admitting: Specialist

## 2013-10-16 DIAGNOSIS — Z01818 Encounter for other preprocedural examination: Secondary | ICD-10-CM | POA: Diagnosis not present

## 2013-10-16 DIAGNOSIS — M47817 Spondylosis without myelopathy or radiculopathy, lumbosacral region: Secondary | ICD-10-CM | POA: Insufficient documentation

## 2013-10-16 DIAGNOSIS — Z01812 Encounter for preprocedural laboratory examination: Secondary | ICD-10-CM | POA: Diagnosis not present

## 2013-10-16 HISTORY — DX: Other specified postprocedural states: Z98.890

## 2013-10-16 HISTORY — DX: Dizziness and giddiness: R42

## 2013-10-16 HISTORY — DX: Dermatitis, unspecified: L30.9

## 2013-10-16 HISTORY — DX: Nausea with vomiting, unspecified: R11.2

## 2013-10-16 HISTORY — DX: Adverse effect of unspecified anesthetic, initial encounter: T41.45XA

## 2013-10-16 LAB — CBC
HCT: 42.1 % (ref 36.0–46.0)
Hemoglobin: 13.7 g/dL (ref 12.0–15.0)
MCH: 27.1 pg (ref 26.0–34.0)
MCHC: 32.5 g/dL (ref 30.0–36.0)
MCV: 83.2 fL (ref 78.0–100.0)
RBC: 5.06 MIL/uL (ref 3.87–5.11)
RDW: 13.1 % (ref 11.5–15.5)
WBC: 7.8 10*3/uL (ref 4.0–10.5)

## 2013-10-16 LAB — BASIC METABOLIC PANEL
BUN: 20 mg/dL (ref 6–23)
CO2: 29 mEq/L (ref 19–32)
Chloride: 102 mEq/L (ref 96–112)
Creatinine, Ser: 1.14 mg/dL — ABNORMAL HIGH (ref 0.50–1.10)
GFR calc Af Amer: 55 mL/min — ABNORMAL LOW (ref >90)
GFR calc non Af Amer: 48 mL/min — ABNORMAL LOW (ref >90)
Glucose, Bld: 100 mg/dL — ABNORMAL HIGH (ref 70–99)

## 2013-10-16 NOTE — Progress Notes (Addendum)
02-07-2010 anesthesia record from dr Turner Daniels surgery placed on pt chart per patient request for anesthesia to review day of surgery. anesthesai and pacu record from 08-11-2011 surgery dr Lequita Halt placed on patient chart per patient request for anesthesia to review day of surgery.

## 2013-10-22 ENCOUNTER — Ambulatory Visit (HOSPITAL_COMMUNITY): Payer: Medicare Other | Admitting: Anesthesiology

## 2013-10-22 ENCOUNTER — Encounter (HOSPITAL_COMMUNITY): Payer: Medicare Other | Admitting: Anesthesiology

## 2013-10-22 ENCOUNTER — Inpatient Hospital Stay (HOSPITAL_COMMUNITY)
Admission: RE | Admit: 2013-10-22 | Discharge: 2013-10-25 | DRG: 520 | Disposition: A | Discharge status: Home / Self Care | Payer: Medicare Other | Source: Ambulatory Visit | Attending: Specialist | Admitting: Specialist

## 2013-10-22 ENCOUNTER — Ambulatory Visit (HOSPITAL_COMMUNITY): Payer: Medicare Other

## 2013-10-22 ENCOUNTER — Encounter (HOSPITAL_COMMUNITY)
Admission: RE | Disposition: A | Discharge status: Home / Self Care | Payer: Self-pay | Source: Ambulatory Visit | Attending: Specialist

## 2013-10-22 ENCOUNTER — Encounter (HOSPITAL_COMMUNITY): Payer: Self-pay

## 2013-10-22 DIAGNOSIS — I739 Peripheral vascular disease, unspecified: Secondary | ICD-10-CM | POA: Diagnosis present

## 2013-10-22 DIAGNOSIS — E559 Vitamin D deficiency, unspecified: Secondary | ICD-10-CM | POA: Diagnosis present

## 2013-10-22 DIAGNOSIS — M48062 Spinal stenosis, lumbar region with neurogenic claudication: Principal | ICD-10-CM | POA: Diagnosis present

## 2013-10-22 DIAGNOSIS — E119 Type 2 diabetes mellitus without complications: Secondary | ICD-10-CM | POA: Diagnosis present

## 2013-10-22 DIAGNOSIS — I129 Hypertensive chronic kidney disease with stage 1 through stage 4 chronic kidney disease, or unspecified chronic kidney disease: Secondary | ICD-10-CM | POA: Diagnosis present

## 2013-10-22 DIAGNOSIS — Z87891 Personal history of nicotine dependence: Secondary | ICD-10-CM | POA: Diagnosis not present

## 2013-10-22 DIAGNOSIS — N189 Chronic kidney disease, unspecified: Secondary | ICD-10-CM | POA: Diagnosis present

## 2013-10-22 DIAGNOSIS — IMO0002 Reserved for concepts with insufficient information to code with codable children: Secondary | ICD-10-CM | POA: Diagnosis present

## 2013-10-22 DIAGNOSIS — M5126 Other intervertebral disc displacement, lumbar region: Secondary | ICD-10-CM | POA: Diagnosis present

## 2013-10-22 DIAGNOSIS — E7800 Pure hypercholesterolemia, unspecified: Secondary | ICD-10-CM | POA: Diagnosis not present

## 2013-10-22 DIAGNOSIS — M519 Unspecified thoracic, thoracolumbar and lumbosacral intervertebral disc disorder: Secondary | ICD-10-CM | POA: Diagnosis not present

## 2013-10-22 DIAGNOSIS — M48061 Spinal stenosis, lumbar region without neurogenic claudication: Secondary | ICD-10-CM

## 2013-10-22 DIAGNOSIS — M79609 Pain in unspecified limb: Secondary | ICD-10-CM | POA: Diagnosis not present

## 2013-10-22 DIAGNOSIS — G589 Mononeuropathy, unspecified: Secondary | ICD-10-CM | POA: Diagnosis present

## 2013-10-22 DIAGNOSIS — M899 Disorder of bone, unspecified: Secondary | ICD-10-CM | POA: Diagnosis present

## 2013-10-22 HISTORY — PX: LUMBAR LAMINECTOMY/DECOMPRESSION MICRODISCECTOMY: SHX5026

## 2013-10-22 HISTORY — DX: Other specified respiratory disorders: J98.8

## 2013-10-22 LAB — GLUCOSE, CAPILLARY
Glucose-Capillary: 106 mg/dL — ABNORMAL HIGH (ref 70–99)
Glucose-Capillary: 146 mg/dL — ABNORMAL HIGH (ref 70–99)
Glucose-Capillary: 126 mg/dL — ABNORMAL HIGH (ref 70–99)
Glucose-Capillary: 141 mg/dL — ABNORMAL HIGH (ref 70–99)

## 2013-10-22 SURGERY — LUMBAR LAMINECTOMY/DECOMPRESSION MICRODISCECTOMY 2 LEVELS
Anesthesia: General | Site: Back | Wound class: Clean

## 2013-10-22 MED ORDER — DOCUSATE SODIUM 100 MG PO CAPS
100.0000 mg | ORAL_CAPSULE | Freq: Two times a day (BID) | ORAL | Status: DC
  Administered 2013-10-22 – 2013-10-25 (×6): 100 mg via ORAL

## 2013-10-22 MED ORDER — PROPOFOL 10 MG/ML IV BOLUS
INTRAVENOUS | Status: DC | PRN
  Administered 2013-10-22: 170 mg via INTRAVENOUS

## 2013-10-22 MED ORDER — INSULIN ASPART 100 UNIT/ML ~~LOC~~ SOLN: 0.0000 [IU] | Freq: Every day | SUBCUTANEOUS | Status: DC

## 2013-10-22 MED ORDER — GLYCOPYRROLATE 0.2 MG/ML IJ SOLN
INTRAMUSCULAR | Status: DC | PRN
  Administered 2013-10-22: 0.6 mg via INTRAVENOUS

## 2013-10-22 MED ORDER — SUCCINYLCHOLINE CHLORIDE 20 MG/ML IJ SOLN
INTRAMUSCULAR | Status: DC | PRN
  Administered 2013-10-22: 100 mg via INTRAVENOUS

## 2013-10-22 MED ORDER — SODIUM CHLORIDE 0.9 % IR SOLN
Status: DC | PRN
  Administered 2013-10-22: 09:00:00

## 2013-10-22 MED ORDER — CEFAZOLIN SODIUM-DEXTROSE 2-3 GM-% IV SOLR
INTRAVENOUS | Status: AC
  Filled 2013-10-22: qty 50

## 2013-10-22 MED ORDER — GLYCOPYRROLATE 0.2 MG/ML IJ SOLN
INTRAMUSCULAR | Status: AC
  Filled 2013-10-22: qty 2

## 2013-10-22 MED ORDER — HYDROCODONE-ACETAMINOPHEN 5-325 MG PO TABS: 1.0000 | ORAL_TABLET | Freq: Four times a day (QID) | ORAL | Status: DC | PRN

## 2013-10-22 MED ORDER — DEXAMETHASONE SODIUM PHOSPHATE 10 MG/ML IJ SOLN
INTRAMUSCULAR | Status: AC
  Filled 2013-10-22: qty 1

## 2013-10-22 MED ORDER — LISINOPRIL 5 MG PO TABS
5.0000 mg | ORAL_TABLET | Freq: Every morning | ORAL | Status: DC
  Administered 2013-10-23 – 2013-10-25 (×3): 5 mg via ORAL
  Filled 2013-10-22 (×3): qty 1

## 2013-10-22 MED ORDER — ONDANSETRON HCL 4 MG/2ML IJ SOLN
INTRAMUSCULAR | Status: DC | PRN
  Administered 2013-10-22: 4 mg via INTRAVENOUS

## 2013-10-22 MED ORDER — NEOSTIGMINE METHYLSULFATE 1 MG/ML IJ SOLN
INTRAMUSCULAR | Status: DC | PRN
  Administered 2013-10-22: 5 mg via INTRAVENOUS

## 2013-10-22 MED ORDER — HYDROMORPHONE HCL PF 1 MG/ML IJ SOLN: 0.2500 mg | INTRAMUSCULAR | Status: DC | PRN

## 2013-10-22 MED ORDER — HYDROCHLOROTHIAZIDE 25 MG PO TABS
25.0000 mg | ORAL_TABLET | Freq: Every morning | ORAL | Status: DC
  Administered 2013-10-23 – 2013-10-25 (×3): 25 mg via ORAL
  Filled 2013-10-22 (×3): qty 1

## 2013-10-22 MED ORDER — ONDANSETRON HCL 4 MG/2ML IJ SOLN
INTRAMUSCULAR | Status: AC
  Filled 2013-10-22: qty 2

## 2013-10-22 MED ORDER — HYDROCODONE-ACETAMINOPHEN 5-325 MG PO TABS
1.0000 | ORAL_TABLET | ORAL | Status: DC | PRN
  Administered 2013-10-22: 1 via ORAL
  Administered 2013-10-22 – 2013-10-23 (×3): 2 via ORAL
  Administered 2013-10-23 – 2013-10-24 (×2): 1 via ORAL
  Administered 2013-10-24 (×2): 2 via ORAL
  Administered 2013-10-25 (×2): 1 via ORAL
  Filled 2013-10-22: qty 2
  Filled 2013-10-22: qty 1
  Filled 2013-10-22 (×4): qty 2
  Filled 2013-10-22 (×2): qty 1
  Filled 2013-10-22 (×2): qty 2
  Filled 2013-10-22: qty 1

## 2013-10-22 MED ORDER — FLUTICASONE PROPIONATE 50 MCG/ACT NA SUSP
2.0000 | NASAL | Status: DC | PRN
  Filled 2013-10-22: qty 16

## 2013-10-22 MED ORDER — THROMBIN 5000 UNITS EX SOLR
OROMUCOSAL | Status: DC | PRN
  Administered 2013-10-22: 10:00:00 via TOPICAL

## 2013-10-22 MED ORDER — CEFAZOLIN SODIUM-DEXTROSE 2-3 GM-% IV SOLR
2.0000 g | Freq: Three times a day (TID) | INTRAVENOUS | Status: AC
  Administered 2013-10-22 (×2): 2 g via INTRAVENOUS
  Filled 2013-10-22 (×2): qty 50

## 2013-10-22 MED ORDER — METOCLOPRAMIDE HCL 5 MG/ML IJ SOLN
INTRAMUSCULAR | Status: AC
  Filled 2013-10-22: qty 2

## 2013-10-22 MED ORDER — MORPHINE SULFATE 2 MG/ML IJ SOLN: 1.0000 mg | INTRAMUSCULAR | Status: DC | PRN

## 2013-10-22 MED ORDER — ONDANSETRON HCL 4 MG/2ML IJ SOLN
4.0000 mg | INTRAMUSCULAR | Status: DC | PRN
  Administered 2013-10-24: 4 mg via INTRAVENOUS
  Filled 2013-10-22: qty 2

## 2013-10-22 MED ORDER — FENTANYL CITRATE 0.05 MG/ML IJ SOLN
INTRAMUSCULAR | Status: DC | PRN
  Administered 2013-10-22: 50 ug via INTRAVENOUS
  Administered 2013-10-22: 100 ug via INTRAVENOUS
  Administered 2013-10-22 (×4): 50 ug via INTRAVENOUS

## 2013-10-22 MED ORDER — BUPIVACAINE-EPINEPHRINE (PF) 0.5% -1:200000 IJ SOLN
INTRAMUSCULAR | Status: DC | PRN
  Administered 2013-10-22: 20 mL

## 2013-10-22 MED ORDER — GLYCOPYRROLATE 0.2 MG/ML IJ SOLN
INTRAMUSCULAR | Status: AC
  Filled 2013-10-22: qty 1

## 2013-10-22 MED ORDER — SODIUM CHLORIDE 0.45 % IV SOLN
INTRAVENOUS | Status: AC
  Administered 2013-10-22: 16:00:00 via INTRAVENOUS

## 2013-10-22 MED ORDER — ACETAMINOPHEN 325 MG PO TABS: 650.0000 mg | ORAL_TABLET | ORAL | Status: DC | PRN

## 2013-10-22 MED ORDER — INSULIN ASPART 100 UNIT/ML ~~LOC~~ SOLN
0.0000 [IU] | Freq: Three times a day (TID) | SUBCUTANEOUS | Status: DC
  Administered 2013-10-22 – 2013-10-23 (×3): 2 [IU] via SUBCUTANEOUS

## 2013-10-22 MED ORDER — BISACODYL 5 MG PO TBEC: 5.0000 mg | DELAYED_RELEASE_TABLET | Freq: Every day | ORAL | Status: DC | PRN

## 2013-10-22 MED ORDER — LACTATED RINGERS IV SOLN
INTRAVENOUS | Status: DC
  Administered 2013-10-22 (×2): via INTRAVENOUS

## 2013-10-22 MED ORDER — METOCLOPRAMIDE HCL 5 MG/ML IJ SOLN
INTRAMUSCULAR | Status: DC | PRN
  Administered 2013-10-22: 10 mg via INTRAVENOUS

## 2013-10-22 MED ORDER — DEXTROSE 5 % IV SOLN
500.0000 mg | Freq: Four times a day (QID) | INTRAVENOUS | Status: DC | PRN
  Administered 2013-10-22: 500 mg via INTRAVENOUS
  Filled 2013-10-22: qty 5

## 2013-10-22 MED ORDER — PANTOPRAZOLE SODIUM 20 MG PO TBEC
20.0000 mg | DELAYED_RELEASE_TABLET | Freq: Every day | ORAL | Status: DC
  Administered 2013-10-24 – 2013-10-25 (×2): 20 mg via ORAL
  Filled 2013-10-22 (×4): qty 1

## 2013-10-22 MED ORDER — METHOCARBAMOL 500 MG PO TABS
500.0000 mg | ORAL_TABLET | Freq: Four times a day (QID) | ORAL | Status: DC | PRN
  Administered 2013-10-22 – 2013-10-24 (×3): 500 mg via ORAL
  Filled 2013-10-22 (×3): qty 1

## 2013-10-22 MED ORDER — PHENOL 1.4 % MT LIQD: 1.0000 | OROMUCOSAL | Status: DC | PRN

## 2013-10-22 MED ORDER — MENTHOL 3 MG MT LOZG
1.0000 | LOZENGE | OROMUCOSAL | Status: DC | PRN
Start: 2013-10-22 — End: 2013-10-25
  Filled 2013-10-22: qty 9

## 2013-10-22 MED ORDER — THROMBIN 5000 UNITS EX SOLR
CUTANEOUS | Status: AC
  Filled 2013-10-22: qty 10000

## 2013-10-22 MED ORDER — BUPIVACAINE-EPINEPHRINE PF 0.5-1:200000 % IJ SOLN
INTRAMUSCULAR | Status: AC
  Filled 2013-10-22: qty 30

## 2013-10-22 MED ORDER — PROMETHAZINE HCL 25 MG/ML IJ SOLN
INTRAMUSCULAR | Status: AC
  Filled 2013-10-22: qty 1

## 2013-10-22 MED ORDER — LINAGLIPTIN 5 MG PO TABS
5.0000 mg | ORAL_TABLET | Freq: Every day | ORAL | Status: DC
  Administered 2013-10-23 – 2013-10-25 (×3): 5 mg via ORAL
  Filled 2013-10-22 (×3): qty 1

## 2013-10-22 MED ORDER — LORATADINE 10 MG PO TABS
10.0000 mg | ORAL_TABLET | Freq: Every day | ORAL | Status: DC
  Administered 2013-10-23 – 2013-10-25 (×3): 10 mg via ORAL
  Filled 2013-10-22 (×3): qty 1

## 2013-10-22 MED ORDER — SODIUM CHLORIDE 0.9 % IJ SOLN: 3.0000 mL | INTRAMUSCULAR | Status: DC | PRN

## 2013-10-22 MED ORDER — DEXAMETHASONE SODIUM PHOSPHATE 10 MG/ML IJ SOLN
INTRAMUSCULAR | Status: DC | PRN
  Administered 2013-10-22: 10 mg via INTRAVENOUS

## 2013-10-22 MED ORDER — SENNOSIDES-DOCUSATE SODIUM 8.6-50 MG PO TABS: 1.0000 | ORAL_TABLET | Freq: Every evening | ORAL | Status: DC | PRN

## 2013-10-22 MED ORDER — PROMETHAZINE HCL 25 MG/ML IJ SOLN
6.2500 mg | INTRAMUSCULAR | Status: DC | PRN
  Administered 2013-10-22: 6.25 mg via INTRAVENOUS

## 2013-10-22 MED ORDER — METHOCARBAMOL 500 MG PO TABS: 500.0000 mg | ORAL_TABLET | Freq: Four times a day (QID) | ORAL | Status: DC | PRN

## 2013-10-22 MED ORDER — LIDOCAINE HCL (CARDIAC) 20 MG/ML IV SOLN
INTRAVENOUS | Status: DC | PRN
  Administered 2013-10-22: 100 mg via INTRAVENOUS

## 2013-10-22 MED ORDER — CEFAZOLIN SODIUM-DEXTROSE 2-3 GM-% IV SOLR
2.0000 g | INTRAVENOUS | Status: DC
  Administered 2013-10-22: 2 g via INTRAVENOUS

## 2013-10-22 MED ORDER — SODIUM CHLORIDE 0.9 % IJ SOLN
3.0000 mL | Freq: Two times a day (BID) | INTRAMUSCULAR | Status: DC
  Administered 2013-10-23 – 2013-10-24 (×3): 3 mL via INTRAVENOUS

## 2013-10-22 MED ORDER — ROCURONIUM BROMIDE 100 MG/10ML IV SOLN
INTRAVENOUS | Status: DC | PRN
  Administered 2013-10-22: 10 mg via INTRAVENOUS
  Administered 2013-10-22: 40 mg via INTRAVENOUS

## 2013-10-22 MED ORDER — NEOSTIGMINE METHYLSULFATE 1 MG/ML IJ SOLN
INTRAMUSCULAR | Status: AC
  Filled 2013-10-22: qty 10

## 2013-10-22 MED ORDER — FENTANYL CITRATE 0.05 MG/ML IJ SOLN
INTRAMUSCULAR | Status: AC
  Filled 2013-10-22: qty 2

## 2013-10-22 MED ORDER — ZOLPIDEM TARTRATE 5 MG PO TABS: 5.0000 mg | ORAL_TABLET | Freq: Every evening | ORAL | Status: DC | PRN

## 2013-10-22 MED ORDER — VANCOMYCIN HCL IN DEXTROSE 1-5 GM/200ML-% IV SOLN: 1000.0000 mg | INTRAVENOUS | Status: DC

## 2013-10-22 MED ORDER — ALUM & MAG HYDROXIDE-SIMETH 200-200-20 MG/5ML PO SUSP: 30.0000 mL | Freq: Four times a day (QID) | ORAL | Status: DC | PRN

## 2013-10-22 MED ORDER — SODIUM CHLORIDE 0.9 % IV SOLN: 250.0000 mL | INTRAVENOUS | Status: DC

## 2013-10-22 MED ORDER — ACETAMINOPHEN 650 MG RE SUPP: 650.0000 mg | RECTAL | Status: DC | PRN

## 2013-10-22 SURGICAL SUPPLY — 47 items
BAG SPEC THK2 15X12 ZIP CLS (MISCELLANEOUS) ×1
BAG ZIPLOCK 12X15 (MISCELLANEOUS) ×2 IMPLANT
CLEANER TIP ELECTROSURG 2X2 (MISCELLANEOUS) ×2 IMPLANT
CLOTH 2% CHLOROHEXIDINE 3PK (PERSONAL CARE ITEMS) ×2 IMPLANT
DECANTER SPIKE VIAL GLASS SM (MISCELLANEOUS) ×2 IMPLANT
DRAPE MICROSCOPE LEICA (MISCELLANEOUS) ×2 IMPLANT
DRAPE POUCH INSTRU U-SHP 10X18 (DRAPES) ×2 IMPLANT
DRAPE SURG 17X11 SM STRL (DRAPES) ×2 IMPLANT
DRAPE UTILITY XL STRL (DRAPES) ×2 IMPLANT
DRSG AQUACEL AG ADV 3.5X 6 (GAUZE/BANDAGES/DRESSINGS) ×1 IMPLANT
DURAPREP 26ML APPLICATOR (WOUND CARE) ×2 IMPLANT
DURASEAL SPINE SEALANT 3ML (MISCELLANEOUS) ×1 IMPLANT
ELECT BLADE TIP CTD 4 INCH (ELECTRODE) ×1 IMPLANT
ELECT REM PT RETURN 9FT ADLT (ELECTROSURGICAL) ×2
ELECTRODE REM PT RTRN 9FT ADLT (ELECTROSURGICAL) ×1 IMPLANT
GLOVE BIOGEL PI IND STRL 7.5 (GLOVE) ×1 IMPLANT
GLOVE BIOGEL PI INDICATOR 7.5 (GLOVE) ×1
GLOVE SURG SS PI 7.5 STRL IVOR (GLOVE) ×2 IMPLANT
GLOVE SURG SS PI 8.0 STRL IVOR (GLOVE) ×4 IMPLANT
GOWN STRL REIN XL XLG (GOWN DISPOSABLE) ×4 IMPLANT
IV CATH 14GX2 1/4 (CATHETERS) ×1 IMPLANT
KIT BASIN OR (CUSTOM PROCEDURE TRAY) ×2 IMPLANT
KIT POSITIONING SURG ANDREWS (MISCELLANEOUS) ×2 IMPLANT
MANIFOLD NEPTUNE II (INSTRUMENTS) ×2 IMPLANT
NDL SPNL 18GX3.5 QUINCKE PK (NEEDLE) ×3 IMPLANT
NEEDLE SPNL 18GX3.5 QUINCKE PK (NEEDLE) ×4 IMPLANT
PATTIES SURGICAL .5 X.5 (GAUZE/BANDAGES/DRESSINGS) IMPLANT
PATTIES SURGICAL .75X.75 (GAUZE/BANDAGES/DRESSINGS) IMPLANT
PATTIES SURGICAL 1X1 (DISPOSABLE) IMPLANT
SPONGE LAP 4X18 X RAY DECT (DISPOSABLE) ×1 IMPLANT
SPONGE SURGIFOAM ABS GEL 100 (HEMOSTASIS) ×2 IMPLANT
STAPLER VISISTAT (STAPLE) ×1 IMPLANT
STRIP CLOSURE SKIN 1/2X4 (GAUZE/BANDAGES/DRESSINGS) ×2 IMPLANT
SUT NURALON 4 0 TR CR/8 (SUTURE) ×1 IMPLANT
SUT PROLENE 3 0 PS 2 (SUTURE) ×2 IMPLANT
SUT VIC AB 1 CT1 27 (SUTURE) ×4
SUT VIC AB 1 CT1 27XBRD ANTBC (SUTURE) IMPLANT
SUT VIC AB 1-0 CT2 27 (SUTURE) IMPLANT
SUT VIC AB 2-0 CT1 27 (SUTURE) ×2
SUT VIC AB 2-0 CT1 TAPERPNT 27 (SUTURE) IMPLANT
SUT VIC AB 2-0 CT2 27 (SUTURE) IMPLANT
SYRINGE 10CC LL (SYRINGE) ×1 IMPLANT
TOWEL OR 17X26 10 PK STRL BLUE (TOWEL DISPOSABLE) ×2 IMPLANT
TOWEL OR NON WOVEN STRL DISP B (DISPOSABLE) ×3 IMPLANT
TRAY FOLEY CATH 14FRSI W/METER (CATHETERS) ×1 IMPLANT
TRAY LAMINECTOMY (CUSTOM PROCEDURE TRAY) ×2 IMPLANT
YANKAUER SUCT BULB TIP NO VENT (SUCTIONS) ×2 IMPLANT

## 2013-10-22 NOTE — Anesthesia Preprocedure Evaluation (Addendum)
Anesthesia Evaluation  Patient identified by MRN, date of birth, ID band Patient awake    Reviewed: Allergy & Precautions, H&P , NPO status , Patient's Chart, lab work & pertinent test results  Airway Mallampati: II TM Distance: <3 FB Neck ROM: Full    Dental no notable dental hx.    Pulmonary neg pulmonary ROS, former smoker,  breath sounds clear to auscultation  Pulmonary exam normal       Cardiovascular hypertension, Pt. on medications Rhythm:Regular Rate:Normal     Neuro/Psych negative neurological ROS  negative psych ROS   GI/Hepatic negative GI ROS, Neg liver ROS,   Endo/Other  diabetes, Oral Hypoglycemic Agents  Renal/GU negative Renal ROS  negative genitourinary   Musculoskeletal negative musculoskeletal ROS (+)   Abdominal   Peds negative pediatric ROS (+)  Hematology negative hematology ROS (+)   Anesthesia Other Findings   Reproductive/Obstetrics negative OB ROS                          Anesthesia Physical Anesthesia Plan  ASA: II  Anesthesia Plan: General   Post-op Pain Management:    Induction: Intravenous  Airway Management Planned: Oral ETT  Additional Equipment:   Intra-op Plan:   Post-operative Plan: Extubation in OR  Informed Consent: I have reviewed the patients History and Physical, chart, labs and discussed the procedure including the risks, benefits and alternatives for the proposed anesthesia with the patient or authorized representative who has indicated his/her understanding and acceptance.   Dental advisory given  Plan Discussed with: CRNA and Surgeon  Anesthesia Plan Comments:         Anesthesia Quick Evaluation  

## 2013-10-22 NOTE — H&P (View-Only) (Signed)
Holly Fields is an 72 y.o. female.   Chief Complaint: back and leg pain HPI: The patient is a 72 year old female being followed for their back symptoms, ongoing since May 2014. Symptoms reported today include: pain (bilateral legs especially at night). The patient states that they are doing poorly. Current treatment includes: activity modification and NSAIDs. The following medication has been used for pain control: antiinflammatory medication (advil or aleve). The patient reports their current pain level to be mild and 6-7 / 10 (right now but gets worse qhs). The patient has reported improvement of their symptoms with: activity modification, Cortisone injections (esi 06/24/2013, short term relief 90%) and physical therapy (goc).  She has pain with standing, better with sitting, although not completely relieved.  Past Medical History  Diagnosis Date  . Vitamin D deficiency   . Hypercholesteremia   . HTN (hypertension)   . PVD (peripheral vascular disease)   . Lymphedema of leg   . Osteopenia   . Edema   . Dyspnea   . Diabetes   . 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 Lowell Guitar)    No past surgical history on file.  Family History  Problem Relation Age of Onset  . Arrhythmia Father   . Arrhythmia Mother   . Heart attack Father   . Heart failure Father   . Hyperlipidemia Mother   . Hypertension Mother    Social History:  reports that she has quit smoking. She does not have any smokeless tobacco history on file. Her alcohol and drug histories are not on file.  Allergies:  Allergies  Allergen Reactions  . Adenosine      bp plummets  . Augmentin [Amoxicillin-Pot Clavulanate]   . Codeine   . Doxycycline     rosacea  . Metformin And Related     diarrhea  . Tramadol      (Not in a hospital admission)  No results found for this or any previous visit (from the past 48 hour(s)). No results found.  Review of Systems   Constitutional: Negative.   HENT: Negative.   Eyes: Negative.   Respiratory: Negative.   Cardiovascular: Negative.   Gastrointestinal: Negative.   Genitourinary: Negative.   Musculoskeletal: Positive for back pain.  Skin: Negative.   Neurological: Negative.   Endo/Heme/Allergies: Negative.   Psychiatric/Behavioral: Negative.     There were no vitals taken for this visit. Physical Exam  Constitutional: She is oriented to person, place, and time. She appears well-developed and well-nourished.  HENT:  Head: Normocephalic and atraumatic.  Eyes: Conjunctivae and EOM are normal. Pupils are equal, round, and reactive to light.  Neck: Normal range of motion. Neck supple.  Cardiovascular: Normal rate and regular rhythm.   Respiratory: Effort normal and breath sounds normal.  GI: Soft. Bowel sounds are normal.  Musculoskeletal:  Straight leg raise produces buttock, thigh and calf pain. Femoral stretch is positive as well. Quadriceps weakness is negative bilaterally as well as hip flexor weakness.  Lumbar spine exam reveals no evidence of soft tissue swelling, no evidence of soft tissue swelling or deformity or skin ecchymosis. On palpation there is no tenderness of the lumbar spine. No flank pain with percussion. The abdomen is soft and nontender. Nontender over the trochanters. No cellulitis or lymphadenopathy.  Motor is 5/5 including EHL, tibialis anterior, plantarflexion, quadriceps and hamstrings. The patient is normoreflexic. There is no Babinski or clonus. Sensory exam is intact to light touch.  The patient has good distal pulses. No DVT. No pain and normal range of motion without instability of the hips, knees and ankles.  Neurological: She is alert and oriented to person, place, and time. She has normal reflexes.  Skin: Skin is warm and dry.  Psychiatric: She has a normal mood and affect.    MRI indicates severe stenosis at 3-4. They report a slight listhesis. Central disc  protrusion. Moderate stenosis on the right at 4-5, mild to moderate on the left. Foraminal cyst on the left. Degenerative changes at 2-3.  Three view radiographs, mild degenerative changes. No instability in flexion and extension.  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 The main problem is severe stenosis at L3-4, refractory.  Given that she is refractory to conservative treatment, neurogenic claudication and severe limitation of her ambulatory capacity and motor weakness we discussed lumbar decompression at L2-3 and L3-4. We discussed specifically the possibility of a CSF leak, ongoing symptoms related to her multi-level disk degeneration but the majority of her stenosis is noted. I do not feel there is any instability noted. We discussed the perioperative course in detail. She will undergo a preoperative clearance.  I had an extensive discussion of the risks and benefits of the lumbar decompression with the patient including bleeding, infection, damage to neurovascular structures, epidural fibrosis, CSF leak requiring repair. We also discussed increase in pain, adjacent segment disease, recurrent disc herniation, need for future surgery including repeat decompression and/or fusion. We also discussed risks of postoperative hematoma, paralysis, anesthetic complications including DVT, PE, death, cardiopulmonary dysfunction. In addition, the perioperative and postoperative courses were discussed in detail including the rehabilitative time and return to functional activity and work. I provided the patient with an illustrated handout and utilized the appropriate surgical models.  Plan microlumbar decompression L2-3, L3-4  Jakwon Gayton M. for Dr. Shelle Iron 10/06/2013, 8:32 AM

## 2013-10-22 NOTE — Brief Op Note (Signed)
10/22/2013  11:14 AM  PATIENT:  Holly Fields  72 y.o. female  PRE-OPERATIVE DIAGNOSIS:  SPINAL STENOSIS L2-3/L3-4  POST-OPERATIVE DIAGNOSIS:  SPINAL STENOSIS L2-3/L3-4  PROCEDURE:  Procedure(s): MICROLUMBAR DECOMPRESSION LUMBAR TWO TO THREE, LUMBAR THREE TO FOUR and faramenotomy two,threeand four ,five bilateral (N/A)  SURGEON:  Surgeon(s) and Role:    * Javier Docker, MD - Primary  PHYSICIAN ASSISTANT:   ASSISTANTS: Bissell   ANESTHESIA:   general  EBL:  Total I/O In: 1000 [I.V.:1000] Out: 550 [Urine:200; Blood:350]  BLOOD ADMINISTERED:none  DRAINS: none   LOCAL MEDICATIONS USED:  MARCAINE     SPECIMEN:  No Specimen  DISPOSITION OF SPECIMEN:  N/A  COUNTS:  YES  TOURNIQUET:  * No tourniquets in log *  DICTATION: .Other Dictation: Dictation Number [id number]  PLAN OF CARE: Admit for overnight observation  PATIENT DISPOSITION:  PACU - hemodynamically stable.   Delay start of Pharmacological VTE agent (>24hrs) due to surgical blood loss or risk of bleeding: yes

## 2013-10-22 NOTE — Interval H&P Note (Signed)
History and Physical Interval Note:  10/22/2013 8:29 AM  Holly Fields  has presented today for surgery, with the diagnosis of STENOSIS L2-3/L3-4  The various methods of treatment have been discussed with the patient and family. After consideration of risks, benefits and other options for treatment, the patient has consented to  Procedure(s): LUMBAR LAMINECTOMY/DECOMPRESSION MICRODISCECTOMY L2-3/L3-4 (N/A) as a surgical intervention .  The patient's history has been reviewed, patient examined, no change in status, stable for surgery.  I have reviewed the patient's chart and labs.  Questions were answered to the patient's satisfaction.     [name]

## 2013-10-22 NOTE — Plan of Care (Signed)
Problem: Consults Goal: Diagnosis - Spinal Surgery Lumbar Laminectomy (Complex)     

## 2013-10-22 NOTE — Progress Notes (Signed)
Utilization review completed.  

## 2013-10-22 NOTE — Anesthesia Postprocedure Evaluation (Signed)
  Anesthesia Post-op Note  Patient: Holly Fields  Procedure(s) Performed: Procedure(s) (LRB): MICROLUMBAR DECOMPRESSION LUMBAR TWO TO THREE, LUMBAR THREE TO FOUR and faramenotomy two,threeand four ,five bilateral (N/A)  Patient Location: PACU  Anesthesia Type: General  Level of Consciousness: awake and alert   Airway and Oxygen Therapy: Patient Spontanous Breathing  Post-op Pain: mild  Post-op Assessment: Post-op Vital signs reviewed, Patient's Cardiovascular Status Stable, Respiratory Function Stable, Patent Airway and No signs of Nausea or vomiting  Last Vitals:  Filed Vitals:   10/22/13 1300  BP: 137/58  Pulse:   Temp:   Resp:     Post-op Vital Signs: stable   Complications: No apparent anesthesia complications

## 2013-10-22 NOTE — Transfer of Care (Signed)
Immediate Anesthesia Transfer of Care Note  Patient: Holly Fields  Procedure(s) Performed: Procedure(s): MICROLUMBAR DECOMPRESSION LUMBAR TWO TO THREE, LUMBAR THREE TO FOUR and faramenotomy two,threeand four ,five bilateral (N/A)  Patient Location: PACU  Anesthesia Type:General  Level of Consciousness: sedated  Airway & Oxygen Therapy: Patient Spontanous Breathing and Patient connected to face mask oxygen  Post-op Assessment: Report given to PACU RN and Post -op Vital signs reviewed and stable  Post vital signs: Reviewed and stable  Complications: No apparent anesthesia complications

## 2013-10-23 ENCOUNTER — Encounter (HOSPITAL_COMMUNITY): Payer: Self-pay | Admitting: Specialist

## 2013-10-23 LAB — BASIC METABOLIC PANEL
BUN: 20 mg/dL (ref 6–23)
Calcium: 9.1 mg/dL (ref 8.4–10.5)
Chloride: 107 mEq/L (ref 96–112)
GFR calc Af Amer: 52 mL/min — ABNORMAL LOW (ref >90)
GFR calc non Af Amer: 45 mL/min — ABNORMAL LOW (ref >90)
Potassium: 4 mEq/L (ref 3.5–5.1)
Sodium: 139 mEq/L (ref 135–145)

## 2013-10-23 LAB — GLUCOSE, CAPILLARY
Glucose-Capillary: 124 mg/dL — ABNORMAL HIGH (ref 70–99)
Glucose-Capillary: 119 mg/dL — ABNORMAL HIGH (ref 70–99)

## 2013-10-23 LAB — CBC
MCH: 27.5 pg (ref 26.0–34.0)
MCHC: 33.5 g/dL (ref 30.0–36.0)
Platelets: 158 10*3/uL (ref 150–400)
RBC: 3.96 MIL/uL (ref 3.87–5.11)
RDW: 13 % (ref 11.5–15.5)
WBC: 19.4 10*3/uL — ABNORMAL HIGH (ref 4.0–10.5)

## 2013-10-23 NOTE — Progress Notes (Signed)
Subjective: 1 Day Post-Op Procedure(s) (LRB): MICROLUMBAR DECOMPRESSION LUMBAR TWO TO THREE, LUMBAR THREE TO FOUR and faramenotomy two,threeand four ,five bilateral (N/A) Patient reports pain as mild.  Reports midline lower back pain, incisional. Leg pain immediately improved post-op. Has been OOB to bathroom, voiding without difficulty. Awaiting PT when she was seen in AM rounds with Dr. Shelle Iron.  Objective: Vital signs in last 24 hours: Temp:  [97.9 F (36.6 C)-98.8 F (37.1 C)] 98.2 F (36.8 C) (11/20 1046) Pulse Rate:  [62-107] 70 (11/20 1046) Resp:  [16-20] 16 (11/20 1046) BP: (100-157)/(58-78) 108/69 mmHg (11/20 1046) SpO2:  [99 %-100 %] 99 % (11/20 1046) Weight:  [86.093 kg (189 lb 12.8 oz)] 86.093 kg (189 lb 12.8 oz) (11/19 1500)  Intake/Output from previous day: 11/19 0701 - 11/20 0700 In: 3466.7 [P.O.:480; I.V.:2986.7] Out: 2250 [Urine:1900; Blood:350] Intake/Output this shift: Total I/O In: 240 [P.O.:240] Out: -    Recent Labs  10/23/13 0410  HGB 10.9*    Recent Labs  10/23/13 0410  WBC 19.4*  RBC 3.96  HCT 32.5*  PLT 158    Recent Labs  10/23/13 0410  NA 139  K 4.0  CL 107  CO2 24  BUN 20  CREATININE 1.20*  GLUCOSE 128*  CALCIUM 9.1   No results found for this basename: LABPT, INR,  in the last 72 hours  Neurologically intact ABD soft Neurovascular intact Sensation intact distally Intact pulses distally Dorsiflexion/Plantar flexion intact Incision: dressing C/D/I and no drainage No cellulitis present Compartment soft no calf pain or sign of DVT  Assessment/Plan: 1 Day Post-Op Procedure(s) (LRB): MICROLUMBAR DECOMPRESSION LUMBAR TWO TO THREE, LUMBAR THREE TO FOUR and faramenotomy two,threeand four ,five bilateral (N/A) Advance diet Up with therapy D/C IV fluids Possible D/C later today vs tomorrow pending PT tolerance and pain level Discussed D/C instructions, Lspine precautions  Holly Fields M. 10/23/2013, 12:44 PM

## 2013-10-23 NOTE — Progress Notes (Signed)
Physical Therapy Treatment Patient Details Name: Holly Fields MRN: [id number] DOB: May 28, 1941 Today's Date: 10/23/2013 Time: 4098-1191 PT Time Calculation (min): 19 min  PT Assessment / Plan / Recommendation  History of Present Illness Pt was admitted for L2-3; L3-4 lamis due to HNP and perineural cyst.  Also, bil foraminotomies performed at those levels   PT Comments   Pt tolerated ambulation and steps. Pt will benefit from improving in mobility prior to DC. Plans to DC tomorrow.  Follow Up Recommendations        Does the patient have the potential to tolerate intense rehabilitation     Barriers to Discharge        Equipment Recommendations  None recommended by PT    Recommendations for Other Services    Frequency Min 5X/week   Progress towards PT Goals Progress towards PT goals: Progressing toward goals  Plan Current plan remains appropriate    Precautions / Restrictions Precautions Precautions: Back Precaution Booklet Issued: Yes (comment) Precaution Comments: reviewed back precautions   Pertinent Vitals/Pain 9- RN notified.Back    Mobilit Bed Mobility Bed Mobility:  Rolling Right: 4: Min guard Right Sidelying to Sit: 4: Min guard Sit to Sidelying Right: 4: Min assist Details for Bed Mobility Assistance: placed RW beside bed to act as a rail to push up with. cues for rolling and precautions Transfers Transfers: Stand to Sit Sit to Stand: 4: Min assist;From bed;With upper extremity assist;From toilet;4: Min guard Stand to Sit: To toilet;To chair/3-in-1;With upper extremity assist;4: Min assist Details for Transfer Assistance: cues for precautions and hand placement, more assist from lower toilet. Ambulation/Gait Ambulation/Gait Assistance: 4: Min assist Ambulation Distance (Feet): 100 Feet Assistive device: Rolling walker Ambulation/Gait Assistance Details: ambulates slowly Gait Pattern: Step-through pattern;Decreased stride length Gait velocity: decr Stairs:  Yes Stairs Assistance: 4: Min assist Stairs Assistance Details (indicate cue type and reason): spouse present to assist. Stair Management Technique: Two rails Number of Stairs: 2    Exercises     PT Diagnosis: Difficulty walking;Acute pain  PT Problem List: Decreased strength;Decreased activity tolerance;Decreased mobility;Decreased safety awareness;Decreased knowledge of use of DME;Pain PT Treatment Interventions: DME instruction;Gait training;Stair training;Functional mobility training;Therapeutic activities;Therapeutic exercise;Patient/family education   PT Goals (current goals can now be found in the care plan section) Acute Rehab PT Goals Patient Stated Goal: decreased pain; regain independence PT Goal Formulation: With patient Time For Goal Achievement: 10/25/13 Potential to Achieve Goals: Good  Visit Information  Last PT Received On: 10/23/13 Assistance Needed: +1 History of Present Illness: Pt was admitted for L2-3; L3-4 lamis due to HNP and perineural cyst.  Also, bil foraminotomies performed at those levels    Subjective Data  Patient Stated Goal: decreased pain; regain independence   Cognition  Cognition Arousal/Alertness: Awake/alert Behavior During Therapy: WFL for tasks assessed/performed Overall Cognitive Status: Within Functional Limits for tasks assessed    Balance     End of Session PT - End of Session Activity Tolerance: Patient tolerated treatment well Patient left: with call bell/phone within reach;in chair Nurse Communication: Mobility status   GP     Rada Hay 10/23/2013, 3:06 PM

## 2013-10-23 NOTE — Care Management Note (Signed)
    Page 1 of 1   10/23/2013     5:10:40 PM   CARE MANAGEMENT NOTE 10/23/2013  Patient:  Holly Fields, Holly Fields   Account Number:  [id number]  Date Initiated:  10/23/2013  Documentation initiated by:  Colleen Can  Subjective/Objective Assessment:   dx HNP, lumbar stenosis; microlumbar decompression L2-3, L3-4 with central laminectomies     Action/Plan:   CM spoke with patient. Plans are for pt to return to her home in [place] where spouse will be caregiver. She already has DME.  There are no HH needs assessed at this time.   Anticipated DC Date:  10/24/2013   Anticipated DC Plan:  HOME/SELF CARE  In-house referral  Clinical Social Worker      DC Planning Services  CM consult      Choice offered to / List presented to:             Status of service:  Completed, signed off Medicare Important Message given?  NA - LOS <3 / Initial given by admissions (If response is "NO", the following Medicare IM given date fields will be blank) Date Medicare IM given:   Date Additional Medicare IM given:    Discharge Disposition:    Per UR Regulation:    If discussed at Long Length of Stay Meetings, dates discussed:    Comments:

## 2013-10-23 NOTE — Evaluation (Addendum)
Physical Therapy Evaluation Patient Details Name: Holly Fields MRN: [id number] DOB: 07-28-1941 Today's Date: 10/23/2013 Time: 6962-9528 PT Time Calculation (min): 20 min  PT Assessment / Plan / Recommendation History of Present Illness  Pt was admitted for L2-3; L3-4 lamis due to HNP and perineural cyst.  Also, bil foraminotomies performed at those levels  Clinical Impression  Pt tolerated ambulation with RW. Will see in PM for gait and steps. Pt will benefit fromPT to address Problems.    PT Assessment  Patient needs continued PT services    Follow Up Recommendations       Does the patient have the potential to tolerate intense rehabilitation      Barriers to Discharge        Equipment Recommendations  None recommended by PT    Recommendations for Other Services     Frequency Min 5X/week    Precautions / Restrictions Precautions Precautions: Back Precaution Booklet Issued: Yes (comment) Precaution Comments: reviewed back precautions   Pertinent Vitals/Pain 4 back after meds.      Mobility  Bed Mobility Bed Mobility: Sit to Sidelying Right Rolling Right: 4: Min guard Right Sidelying to Sit: 4: Min guard  Details for Bed Mobility Assistance: placed RW beside bed to act as a rail to push up with. cues for rolling and precautions Transfers Transfers: Stand to Sit Sit to Stand: 4: Min assist;From bed;With upper extremity assist;From toilet;4: Min guard Stand to Sit: To toilet;To chair/3-in-1;With upper extremity assist;4: Min assist Details for Transfer Assistance: cues for precautions and hand placement, more assist from lower toilet. Ambulation/Gait Ambulation/Gait Assistance: 4: Min assist Ambulation Distance (Feet): 100 Feet Assistive device: Rolling walker Ambulation/Gait Assistance Details: ambulates slowly Gait Pattern: Step-through pattern;Decreased stride length Gait velocity: decr    Exercises     PT Diagnosis: Difficulty walking;Acute pain  PT  Problem List: Decreased strength;Decreased activity tolerance;Decreased mobility;Decreased safety awareness;Decreased knowledge of use of DME;Pain PT Treatment Interventions: DME instruction;Gait training;Stair training;Functional mobility training;Therapeutic activities;Therapeutic exercise;Patient/family education     PT Goals(Current goals can be found in the care plan section) Acute Rehab PT Goals Patient Stated Goal: decreased pain; regain independence PT Goal Formulation: With patient Time For Goal Achievement: 10/25/13 Potential to Achieve Goals: Good  Visit Information  Last PT Received On: 10/23/13 Assistance Needed: +1 History of Present Illness: Pt was admitted for L2-3; L3-4 lamis due to HNP and perineural cyst.  Also, bil foraminotomies performed at those levels       Prior Functioning  Home Living Family/patient expects to be discharged to:: Private residence Living Arrangements: Spouse/significant other Type of Home: House Home Access: Stairs to enter [employer] of Steps: 4-5 Entrance Stairs-Rails: Can reach both Home Layout: Two level;Able to live on main level with bedroom/bathroom Home Equipment: Dan Humphreys - 2 wheels;Cane - single point;Bedside commode Additional Comments: window next to toilet to push from    Cognition  Cognition Arousal/Alertness: Awake/alert Behavior During Therapy: [facility] for tasks assessed/performed Overall Cognitive Status: Within Functional Limits for tasks assessed    Extremity/Trunk Assessment Lower Extremity Assessment Lower Extremity Assessment: Overall WFL for tasks assessed   Balance    End of Session PT - End of Session Activity Tolerance: Patient tolerated treatment well Patient left: in bed;with call bell/phone within reach Nurse Communication: Mobility status  GP     Rada Hay 10/23/2013, 3:01 PM

## 2013-10-23 NOTE — Op Note (Signed)
NAMEMarland Fields  STEPHANNY, TSUTSUI NO.:  [id number]  MEDICAL RECORD NO.:  [id number]  LOCATION:  1603                         FACILITY:  [facility]  PHYSICIAN:  Jene Every, M.D.    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  PROCEDURE PERFORMED:  Microlumbar decompression at L2-3 and L3-4 with central laminectomies of L2-L3 and bilateral foraminotomies of L2, L3, and L4.  ANESTHESIA:  General.  ASSISTANT:  Lanna Poche, PA.  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  She had a central disk and perineural cysts off into the left L3-4.  Severe facet arthrosis, minimal listhesis, no instability on flexion and extension.  She had anterolateral flap pain with weakness in the quadriceps.  Hip flexures indicated for decompression.  Risk and benefits were discussed including bleeding, infection, damage to neurovascular structures, DVT, PE, anesthetic complications, etc.  TECHNIQUE:  With the patient in supine position, after induction of adequate general anesthesia, 2 g Kefzol, placed prone on the [place] frame.  All bony prominences were well padded.  Lumbar region was prepped and draped in usual sterile fashion.  Two 18-gauge spinal needle was utilized to localize the L2-3 and L3-4 interspace confirmed with x- ray.  Incision was made from spinous process above L2 to below L4. Subcutaneous tissue were dissected.  Electrocautery was utilized to achieve hemostasis.  Dorsolumbar fascia was identified and divided in line with skin  incision.  Paraspinous muscles were infiltrated with 0.25% Marcaine with epinephrine.  Confirmatory radiograph was obtained. Leksell rongeur was utilized to remove the spinous process of L2 and L3. Attention was turned first towards L2 centrally with a 2 mm Kerrison, and performed the central laminectomy removing the central neural arch. We used the operating microscope.  We began the decompression of the lateral recesses bilaterally from the either side of the operating room table to the medial border of the pedicle.  Severe stenosis was noted at L2-3 and at L3-4 bilaterally.  I performed foraminotomies of L2 and L3 bilaterally.  There was severe stenosis also noted at L3-4 after we removed the neural arch of 2 and removed the neural arch of 3 protecting the thecal sac with Neuro Patties and Woodson retractor at all times. We removed the central lamina of L3 and ligamentum flavum from L3-4 and decompressed the lateral recesses at L3-4 to the medial border of the pedicle.  Performed foraminotomies of L3 and 4 bilaterally.  Severe stenosis was noted bilaterally.  Performed particularly foraminotomy of L4 and L3 on the left.  At L3-4 particularly care was taken to evaluate the foramen of L3 as there was a noted perineural cyst.  Some hypertrophy of the nerve of the shoulder of the nerve root, however, no definitive cystic structure was  seen to the medial border of the pedicle.  I performed foraminotomies of L3 and L4 as this was really stenotic bilaterally.  Ligamentum flavum and facet hypertrophy.  We used bone wax and thrombin-soaked Gelfoam throughout the case to control bleeding.  We checked the disk bilaterally at L3-4 and L2-3, hardened disk bilaterally without a specific disk herniation.  Bipolar electrocautery was also utilized to achieve hemostasis.  We obtained a confirmatory radiograph with Woodson retractors in the foramen of L2 and L4 to confirm the operative level and checked  both foramen at L2, L3, and L4 bilaterally with a Woodson retractor, and they were widely patent.  Good restoration of the thecal sac was noted.  No tension on the nerve roots was noted either.  Again, we achieved strict hemostasis. Copiously irrigated the wounds.  Inspection revealed no CSF leaks or active bleeding.  Placed thrombin-soaked Gelfoam in laminotomy defect. Bone wax on the cancellous surfaces.  Removed the [facility] retractor. Copiously irrigated the paraspinous musculature.  No active bleeding. Therefore, repaired the dorsal lumbar fascia with #1 Vicryl interrupted figure-of-eight sutures, subcu with 2-0, skin were reapproximated with staples.  Wound was dressed sterilely.  Placed supine on hospital bed, extubated without difficulty, and transported to recovery in satisfactory condition.  The patient tolerated the procedure well.  No complications.  Estimated blood loss 350 mL.     Jene Every, M.D.     Cordelia Pen  D:  10/22/2013  T:  10/23/2013  Job:  [id number]

## 2013-10-23 NOTE — Evaluation (Signed)
Occupational Therapy Evaluation Patient Details Name: Holly Fields MRN: [id number] DOB: [birth year] Today's Date: 10/23/2013 Time: 4098-1191 OT Time Calculation (min): 40 min  OT Assessment / Plan / Recommendation History of present illness Pt was admitted for L2-3; L3-4 lamis due to HNP and perineural cyst.  Also, bil foraminotomies performed at those levels   Clinical Impression  Pt was admitted for the above back surgery.  She will benefit from skilled OT to continue to reinforce back precautions with adls and bathroom transfers.      OT Assessment  Patient needs continued OT Services    Follow Up Recommendations  No OT follow up    Barriers to Discharge      Equipment Recommendations  None recommended by OT    Recommendations for Other Services    Frequency  Min 2X/week    Precautions / Restrictions Precautions Precautions: Back Precaution Booklet Issued: Yes (comment) Precaution Comments: reviewed back precautions Restrictions Weight Bearing Restrictions: No   Pertinent Vitals/Pain 5/10 back.  Repositioned.  Was premedicated    ADL  Grooming: Min guard Where Assessed - Grooming: Supported standing Upper Body Bathing: Set up Where Assessed - Upper Body Bathing: Unsupported sitting Lower Body Bathing: Minimal assistance (with reacher) Where Assessed - Lower Body Bathing: Supported sit to stand Upper Body Dressing: Set up Where Assessed - Upper Body Dressing: Unsupported sitting Lower Body Dressing: Maximal assistance (reacher for pants) Where Assessed - Lower Body Dressing: Supported sit to Pharmacist, hospital: Minimal Dentist Method: Sit to Barista: Comfort height toilet;Grab bars (3:1 next to commode for second arm rail) Toileting - Clothing Manipulation and Hygiene: Minimal assistance Where Assessed - Engineer, mining and Hygiene: Sit to stand from 3-in-1 or toilet Equipment Used: Reacher;Rolling  walker Transfers/Ambulation Related to ADLs: ambulated to bathroom with min A; pt slightly unsteady but no LOB.  Has more difficulty with RLE--able to advance independently but didn't step up completely to L ADL Comments: husband will help with adls.  Pt wears compression stockings for lymphedema.  Educated on precautions for adls and alternative methods explained/demonstrated.  Discussed placing 3:1 over her high toilet as she only has window to push from (and may be too high).  Also discussed using this in shower    OT Diagnosis: Generalized weakness  OT Problem List: Decreased strength;Decreased activity tolerance;Decreased knowledge of use of DME or AE;Decreased knowledge of precautions;Pain OT Treatment Interventions: Self-care/ADL training;DME and/or AE instruction;Patient/family education   OT Goals(Current goals can be found in the care plan section) Acute Rehab OT Goals Patient Stated Goal: decreased pain; regain independence OT Goal Formulation: With patient Time For Goal Achievement: 10/30/13 Potential to Achieve Goals: Good ADL Goals Pt Will Perform Lower Body Dressing: with supervision;with adaptive equipment;sit to/from stand (pants only) Pt Will Transfer to Toilet: with supervision;ambulating;bedside commode Pt Will Perform Tub/Shower Transfer: with min assist;3 in 1;ambulating  Visit Information  Last OT Received On: 10/23/13 History of Present Illness: Pt was admitted for L2-3; L3-4 lamis due to HNP and perineural cyst.  Also, bil foraminotomies performed at those levels       Prior Functioning     Home Living Family/patient expects to be discharged to:: Private residence Living Arrangements: Spouse/significant other Available Help at Discharge:  (mother 65 lives with her; can run errands) Type of Home: House Home Access: Stairs to enter Secretary/administrator of Steps: 4-5 Entrance Stairs-Rails: Can reach both Home Layout: Two level;Able to live on main level  with  bedroom/bathroom Home Equipment: Walker - 2 wheels;Cane - single point;Bedside commode Additional Comments: window next to toilet to push from Prior Function Level of Independence: Independent with assistive device(s) (wears compression stockings for lymphedema) Communication Communication: No difficulties         Vision/Perception     Cognition  Cognition Arousal/Alertness: Awake/alert Behavior During Therapy: WFL for tasks assessed/performed Overall Cognitive Status: Within Functional Limits for tasks assessed    Extremity/Trunk Assessment Upper Extremity Assessment Upper Extremity Assessment: Overall WFL for tasks assessed     Mobility Bed Mobility Bed Mobility: Rolling Right;Right Sidelying to Sit Rolling Right: 4: Min assist;With rail Right Sidelying to Sit: 4: Min assist;HOB flat Details for Bed Mobility Assistance: cues for precautions/technique.  assist for trunk to roll and sit up Transfers Transfers: Stand to Sit;Sit to Stand Sit to Stand: 4: Min assist;From bed;From chair/3-in-1;With upper extremity assist Details for Transfer Assistance: cues for precautions and hand placement     Exercise     Balance     End of Session OT - End of Session Activity Tolerance: Patient tolerated treatment well Patient left: in chair;with call bell/phone within reach  GO     [facility] 10/23/2013, 9:44 AM Marica Otter, OTR/L [phone] 10/23/2013

## 2013-10-24 LAB — GLUCOSE, CAPILLARY
Glucose-Capillary: 81 mg/dL (ref 70–99)
Glucose-Capillary: 96 mg/dL (ref 70–99)
Glucose-Capillary: 109 mg/dL — ABNORMAL HIGH (ref 70–99)

## 2013-10-24 MED ORDER — ASPIRIN 81 MG PO TABS: 81.0000 mg | ORAL_TABLET | Freq: Every day | ORAL | Status: DC

## 2013-10-24 NOTE — Progress Notes (Signed)
Subjective: 2 Days Post-Op Procedure(s) (LRB): MICROLUMBAR DECOMPRESSION LUMBAR TWO TO THREE, LUMBAR THREE TO FOUR and faramenotomy two,threeand four ,five bilateral (N/A) Patient reports pain as mild.  C/o mild pain lower back, incisional, well controlled, improved since yesterday. No leg pain. Tolerated PT well yesterday. No other c/o. Feels ready for D/C home today.  Objective: Vital signs in last 24 hours: Temp:  [98.2 F (36.8 C)-98.6 F (37 C)] 98.6 F (37 C) (11/21 0515) Pulse Rate:  [62-86] 73 (11/21 0759) Resp:  [16-18] 18 (11/21 0759) BP: (105-121)/(65-73) 121/73 mmHg (11/21 0759) SpO2:  [97 %-100 %] 97 % (11/21 0759)  Intake/Output from previous day: 11/20 0701 - 11/21 0700 In: 480 [P.O.:480] Out: 1025 [Urine:1025] Intake/Output this shift:     Recent Labs  10/23/13 0410  HGB 10.9*    Recent Labs  10/23/13 0410  WBC 19.4*  RBC 3.96  HCT 32.5*  PLT 158    Recent Labs  10/23/13 0410  NA 139  K 4.0  CL 107  CO2 24  BUN 20  CREATININE 1.20*  GLUCOSE 128*  CALCIUM 9.1   No results found for this basename: LABPT, INR,  in the last 72 hours  Neurologically intact ABD soft Neurovascular intact Sensation intact distally Intact pulses distally Dorsiflexion/Plantar flexion intact Incision: dressing C/D/I and no drainage No cellulitis present Compartment soft no calf pain or sign of DVT  Assessment/Plan: 2 Days Post-Op Procedure(s) (LRB): MICROLUMBAR DECOMPRESSION LUMBAR TWO TO THREE, LUMBAR THREE TO FOUR and faramenotomy two,threeand four ,five bilateral (N/A) Advance diet Up with therapy Again discussed D/C instructions, Lspine precautions Plan for D/C home today after at least AM session of PT, possibly PM session if required Discussed with Dr. Shelle Iron  After rounding on the patient, I received a phone call at 0800 from nursing staff that she was found seated in her chair, pale, brief episode unresponsiveness, possible syncopal episode, just  after transferring from bed to chair. She had not received pain medication since 0100. Pt now responsive, AAOx3, with vitals WNL, no c/o. Not hypotensive. Discussed possible need for bolus, EKG, further workup if any ongoing complaints or abnormalities in vitals.  BISSELL, JACLYN M. 10/24/2013, 8:16 AM

## 2013-10-24 NOTE — Progress Notes (Signed)
Occupational Therapy Treatment Patient Details Name: AVRI PAIVA MRN: [id number] DOB: 1941/09/10 Today's Date: 10/24/2013 Time: 7829-5621 OT Time Calculation (min): 13 min  OT Assessment / Plan / Recommendation  History of present illness Pt was admitted for L2-3; L3-4 lamis due to HNP and perineural cyst.  Also, bil foraminotomies performed at those levels   OT comments  Pt tolerated session well; when I returned after breakfast, pt was not feeling well and reported feeling like she was blacking out.  Nursing aware.  BP 126/57  Follow Up Recommendations  No OT follow up    Barriers to Discharge       Equipment Recommendations  None recommended by OT    Recommendations for Other Services    Frequency Min 2X/week   Progress towards OT Goals Progress towards OT goals: Progressing toward goals  Plan      Precautions / Restrictions Precautions Precautions: Back Restrictions Weight Bearing Restrictions: No   Pertinent Vitals/Pain 5/10 with movement:  Back.  Requested pain meds    ADL  Toilet Transfer: Supervision/safety Toilet Transfer Method: Sit to stand Toilet Transfer Equipment: Comfort height toilet;Grab bars Tub/Shower Transfer: Insurance risk surveyor Method: Science writer: Walk in shower Transfers/Ambulation Related to ADLs: min a for bed mobility.  supervision, cues for sit to stand ADL Comments: breakfast came and pt wanted to eat:  did not address LB dressing.  returned at 8:40 and pt reported she felt like she was going to black out:  nursing aware.  BP 126/57    OT Diagnosis:    OT Problem List:   OT Treatment Interventions:     OT Goals(current goals can now be found in the care plan section)    Visit Information  Last OT Received On: 10/24/13 Assistance Needed: +1 History of Present Illness: Pt was admitted for L2-3; L3-4 lamis due to HNP and perineural cyst.  Also, bil foraminotomies performed at those levels     Subjective Data      Prior Functioning       Cognition  Cognition Arousal/Alertness: Awake/alert Behavior During Therapy: WFL for tasks assessed/performed Overall Cognitive Status: Within Functional Limits for tasks assessed    Mobility  Bed Mobility Rolling Right: 4: Min assist Sit to Sidelying Right: 4: Min assist Details for Bed Mobility Assistance: no rail Transfers Sit to Stand: 5: Supervision;From bed;From chair/3-in-1;With upper extremity assist Details for Transfer Assistance: cues for back precautions    Exercises      Balance     End of Session OT - End of Session Activity Tolerance: Patient tolerated treatment well Patient left: in chair;with call bell/phone within reach  GO     [facility] 10/24/2013, 8:46 AM Marica Otter, OTR/L [phone] 10/24/2013

## 2013-10-24 NOTE — Progress Notes (Signed)
Physical Therapy Treatment Patient Details Name: Holly Fields MRN: [id number] DOB: 27-Oct-1941 Today's Date: 10/24/2013 Time: 1884-1660 PT Time Calculation (min): 43 min  PT Assessment / Plan / Recommendation  History of Present Illness Pt was admitted for L2-3; L3-4 lamis due to HNP and perineural cyst.  Also, bil foraminotomies performed at those levels   PT Comments   Spouse present during session for [facility] Education.  Assisted pt OOB to amb in hallway, practice steps then position in recliner.  Pt and spouse re educated on back percautions and lifting restrictions.  Also given a back book.     Follow Up Recommendations  No PT follow up     Does the patient have the potential to tolerate intense rehabilitation     Barriers to Discharge        Equipment Recommendations  None recommended by PT    Recommendations for Other Services    Frequency Min 5X/week   Progress towards PT Goals Progress towards PT goals: Progressing toward goals  Plan      Precautions / Restrictions Precautions Precautions: Back Precaution Comments: reviewed back precautions and given back book Restrictions Weight Bearing Restrictions: No    Pertinent Vitals/Pain C/o 6/10 back pain ICE applied Pre medicated    Mobility  Bed Mobility Bed Mobility: Rolling Right;Right Sidelying to Sit Rolling Right: 4: Min guard Right Sidelying to Sit: 4: Min guard Details for Bed Mobility Assistance: spouse assisted pt with proper "log roll" tech only needing <25% VC's on proper tech from therapist.  Transfers Transfers: Sit to Stand;Stand to Sit Sit to Stand: 5: Supervision;From bed Stand to Sit: 5: Supervision;To chair/3-in-1 Details for Transfer Assistance: 25% VC's on proper tech and hand placement plus increased time Ambulation/Gait Ambulation/Gait Assistance: 5: Supervision Ambulation Distance (Feet): 115 Feet Assistive device: Rolling walker Ambulation/Gait Assistance Details: with spouse and  increased time with <25% VC's on safety with turns and proper upright posture/walker to self distance Gait Pattern: Step-through pattern;Decreased stride length Gait velocity: decreased Stairs: Yes Stairs Assistance: 4: Min guard Stairs Assistance Details (indicate cue type and reason): with spouse performed stairs only needing <25% VC's on safety with sequencing desending Stair Management Technique: Two rails Number of Stairs: 2     PT Goals (current goals can now be found in the care plan section)    Visit Information  Last PT Received On: 10/24/13 Assistance Needed: +1 History of Present Illness: Pt was admitted for L2-3; L3-4 lamis due to HNP and perineural cyst.  Also, bil foraminotomies performed at those levels    Subjective Data      Cognition       Balance     End of Session PT - End of Session Equipment Utilized During Treatment: Gait belt Activity Tolerance: Patient tolerated treatment well;Patient limited by pain Patient left: in chair;with call bell/phone within reach;with family/visitor present Nurse Communication:  (Pt ready for D/C to home)   Felecia Shelling  PTA [facility]  Acute  Rehab Pager      [phone]

## 2013-10-24 NOTE — Progress Notes (Signed)
CSW consulted for SNF placement. Pt plans to return home with [facility] services following hospital d/c. RNCM will assist with d/c planning.  Cori Razor LCSW [phone]

## 2013-10-24 NOTE — Progress Notes (Signed)
Jaclyn Bissel PA notified pt found unresponsive, became alert gradually w/ stimulation; pt well oriented, SBP 120's , HR 70's, O2 sat 97%, BG 96; pt reports feeling weak and "woozy".  RN continuing to monitor.

## 2013-10-24 NOTE — Plan of Care (Signed)
Problem: Consults Goal: Diagnosis - Spinal Surgery Outcome: Completed/Met Date Met:  10/24/13 Lumbar Laminectomy (Complex)     

## 2013-10-25 LAB — GLUCOSE, CAPILLARY: Glucose-Capillary: 92 mg/dL (ref 70–99)

## 2013-10-25 NOTE — Progress Notes (Signed)
Occupational Therapy Treatment Patient Details Name: Holly Fields MRN: [id number] DOB: 1941/01/29 Today's Date: 10/25/2013 Time: 1478-2956 OT Time Calculation (min): 27 min  OT Assessment / Plan / Recommendation     OT comments  Pt making progress and states husband will help her  With ADL activity. Pt does have a reacher at home. Education provided regarding ADL activity and back precautions.                       Precautions / Restrictions Restrictions Weight Bearing Restrictions: No       ADL  Upper Body Dressing: Set up Where Assessed - Upper Body Dressing: Unsupported sit to stand;Unsupported sitting Lower Body Dressing: Minimal assistance Where Assessed - Lower Body Dressing: Unsupported sit to stand Toilet Transfer: Supervision/safety Toilet Transfer Method: Sit to stand Where Assessed - Toileting Clothing Manipulation and Hygiene: Standing Equipment Used: Reacher;Rolling walker      OT Goals(current goals can now be found in the care plan section)    Visit Information  Last OT Received On: 10/25/13 Assistance Needed: +1             Mobility  Transfers Transfers: Sit to Stand;Stand to Sit Sit to Stand: 4: Min guard;From chair/3-in-1;With upper extremity assist;From toilet Stand to Sit: 4: Min guard;With upper extremity assist;To chair/3-in-1;To toilet          End of Session OT - End of Session Activity Tolerance: Patient tolerated treatment well Patient left: in chair;with call bell/phone within reach  GO     [facility] [place] 10/25/2013, 11:17 AM

## 2013-10-25 NOTE — Progress Notes (Signed)
Subjective: 3 Days Post-Op Procedure(s) (LRB): MICROLUMBAR DECOMPRESSION LUMBAR TWO TO THREE, LUMBAR THREE TO FOUR and faramenotomy two,threeand four ,five bilateral (N/A) Patient reports pain as 2 on 0-10 scale.  Ready for DC  Objective: Vital signs in last 24 hours: Temp:  [98.7 F (37.1 C)-99.3 F (37.4 C)] 98.7 F (37.1 C) (11/22 1610) Pulse Rate:  [86-95] 86 (11/22 0638) Resp:  [16-18] 18 (11/22 9604) BP: (99-114)/(60-74) 114/74 mmHg (11/22 0638) SpO2:  [97 %-99 %] 97 % (11/22 5409)  Intake/Output from previous day: 11/21 0701 - 11/22 0700 In: 483 [P.O.:480; I.V.:3] Out: 802 [Urine:802] Intake/Output this shift:     Recent Labs  10/23/13 0410  HGB 10.9*    Recent Labs  10/23/13 0410  WBC 19.4*  RBC 3.96  HCT 32.5*  PLT 158    Recent Labs  10/23/13 0410  NA 139  K 4.0  CL 107  CO2 24  BUN 20  CREATININE 1.20*  GLUCOSE 128*  CALCIUM 9.1   No results found for this basename: LABPT, INR,  in the last 72 hours  Neurologically intact  Assessment/Plan: 3 Days Post-Op Procedure(s) (LRB): MICROLUMBAR DECOMPRESSION LUMBAR TWO TO THREE, LUMBAR THREE TO FOUR and faramenotomy two,threeand four ,five bilateral (N/A) Discharge home with home health  Holly Fields A 10/25/2013, 9:48 AM

## 2013-10-25 NOTE — Progress Notes (Signed)
Discharged from floor via w/c, spouse with pt. No changes in assessment. Holly Fields   

## 2013-10-25 NOTE — Discharge Summary (Signed)
Physician Discharge Summary   Patient ID: Holly Fields MRN: [id number] DOB/AGE: January 17, 1941 72 y.o.  Admit date: 10/22/2013 Discharge date: 10/25/2013  Primary Diagnosis:   SPINAL STENOSIS L2-3/L3-4  Admission Diagnoses:  Past Medical History  Diagnosis Date  . Vitamin D deficiency   . Hypercholesteremia   . HTN (hypertension)   . Lymphedema of leg     right more than left  . Osteopenia   . Edema   . Diabetes   . CKD (chronic kidney disease)      saw dr Lowell Guitar 2 years ago, now released from nephrology  . Vertigo     at times, cannot turn on left side quickly or sleep on left side  . Eczema     at times  . Complication of anesthesia 2010    "sluggish after anesthesia and had vertigo", slow to awaken after knee artroscopy 2012  . PONV (postoperative nausea and vomiting)     pt has n/v and vertigo after anesthesia  . Narrowing of airway 2010   Discharge Diagnoses:   Principal Problem:   Spinal stenosis of lumbar region  Procedure:  Procedure(s) (LRB): MICROLUMBAR DECOMPRESSION LUMBAR TWO TO THREE, LUMBAR THREE TO FOUR and faramenotomy two,threeand four ,five bilateral (N/A)   Consults: None  HPI:  see H&P    Laboratory Data: Hospital Outpatient Visit on 10/16/2013  Component Date Value Range Status  . Sodium 10/16/2013 139  135 - 145 mEq/L Final  . Potassium 10/16/2013 4.3  3.5 - 5.1 mEq/L Final   Comment: MODERATE HEMOLYSIS                          HEMOLYSIS AT THIS LEVEL MAY AFFECT RESULT  . Chloride 10/16/2013 102  96 - 112 mEq/L Final  . CO2 10/16/2013 29  19 - 32 mEq/L Final  . Glucose, Bld 10/16/2013 100* 70 - 99 mg/dL Final  . BUN 40/98/1191 20  6 - 23 mg/dL Final  . Creatinine, Ser 10/16/2013 1.14* 0.50 - 1.10 mg/dL Final  . Calcium 47/82/9562 9.8  8.4 - 10.5 mg/dL Final  . GFR calc non Af Amer 10/16/2013 48* >90 mL/min Final  . GFR calc Af Amer 10/16/2013 55* >90 mL/min Final   Comment: (NOTE)                          The eGFR has been calculated  using the CKD EPI equation.                          This calculation has not been validated in all clinical situations.                          eGFR's persistently <90 mL/min signify possible Chronic Kidney                          Disease.  . WBC 10/16/2013 7.8  4.0 - 10.5 K/uL Final  . RBC 10/16/2013 5.06  3.87 - 5.11 MIL/uL Final  . Hemoglobin 10/16/2013 13.7  12.0 - 15.0 g/dL Final  . HCT 13/07/6577 42.1  36.0 - 46.0 % Final  . MCV 10/16/2013 83.2  78.0 - 100.0 fL Final  . MCH 10/16/2013 27.1  26.0 - 34.0 pg Final  . MCHC 10/16/2013 32.5  30.0 - 36.0 g/dL Final  .  RDW 10/16/2013 13.1  11.5 - 15.5 % Final  . Platelets 10/16/2013 206  150 - 400 K/uL Final  . MRSA, PCR 10/16/2013 NEGATIVE  NEGATIVE Final  . Staphylococcus aureus 10/16/2013 NEGATIVE  NEGATIVE Final   Comment:                                 The Xpert SA Assay (FDA                          approved for NASAL specimens                          in patients over 28 years of age),                          is one component of                          a comprehensive surveillance                          program.  Test performance has                          been validated by [employer] for patients greater                          than or equal to 57 year old.                          It is not intended                          to diagnose infection nor to                          guide or monitor treatment.    Recent Labs  10/23/13 0410  HGB 10.9*    Recent Labs  10/23/13 0410  WBC 19.4*  RBC 3.96  HCT 32.5*  PLT 158    Recent Labs  10/23/13 0410  NA 139  K 4.0  CL 107  CO2 24  BUN 20  CREATININE 1.20*  GLUCOSE 128*  CALCIUM 9.1   No results found for this basename: LABPT, INR,  in the last 72 hours  X-Rays:Dg Chest 2 View  10/16/2013   CLINICAL DATA:  Preop evaluation for lumbar surgery.  EXAM: CHEST  2 VIEW  COMPARISON:  06/15/2009  FINDINGS: Cardiac shadow is  stable. The lungs are well aerated bilaterally without focal infiltrate or sizable effusion. No acute bony abnormality is seen.  IMPRESSION: No acute abnormality noted.   Electronically Signed   By: Alcide Clever M.D.   On: 10/16/2013 11:50   Dg Lumbar Spine 2-3 Views  10/16/2013   CLINICAL DATA:  Preoperative evaluation for lumbar spine surgery  EXAM: LUMBAR SPINE - 2-3 VIEW  COMPARISON:  None.  FINDINGS: Five lumbar type vertebral bodies are well visualized. Osteophytic changes  are seen. No acute compression deformity is noted. No overlying soft tissue abnormality is seen.  IMPRESSION: Mild degenerative change without acute abnormality.   Electronically Signed   By: Alcide Clever M.D.   On: 10/16/2013 11:54   Dg Spine Portable 1 View  10/22/2013   CLINICAL DATA:  Spinal localization.  EXAM: PORTABLE SPINE - 1 VIEW  COMPARISON:  10/22/2013.  FINDINGS: Tissue spreaders are once again present. A hemostat is appreciated marking the posterior elements of L2. A 2nd hemostat is appreciated marking posterior elements L4.  IMPRESSION: Spine localization as described above.   Electronically Signed   By: Salome Holmes M.D.   On: 10/22/2013 10:59   Dg Spine Portable 1 View  10/22/2013   CLINICAL DATA:  Spinal localization  EXAM: PORTABLE SPINE - 1 VIEW  COMPARISON:  Same day  FINDINGS: Tissue spreaders are present. One hemostatic marks the lamina of L2 at the level of the L2-3 disc space. The lower hemostatic marked see lamina of L3, at the L3-4 disc space.  IMPRESSION: L2-3 and L3-4 disc levels localized.   Electronically Signed   By: Paulina Fusi M.D.   On: 10/22/2013 09:28   Dg Spine Portable 1 View  10/22/2013   CLINICAL DATA:  Operative localization radiographs. Lumbar spine surgery.  EXAM: PORTABLE SPINE - 1 VIEW  COMPARISON:  10/16/2013  FINDINGS: Single image Re processed a several times shows 2 needles posteriorly, the upper needle directed towards the interspinous space of L2-3 in the lower needle  directed towards the interspinous space of L3-4.  IMPRESSION: L2-3 and L3-4 interspinous spaces localized.   Electronically Signed   By: Paulina Fusi M.D.   On: 10/22/2013 09:23    EKG: Orders placed in visit on 09/22/13  . EKG 12-LEAD     Hospital Course: Patient was admitted to [facility] and taken to the OR and underwent the above state procedure without complications.  Patient tolerated the procedure well and was later transferred to the recovery room and then to the orthopaedic floor for postoperative care.  They were given PO and IV analgesics for pain control following their surgery.  They were given 24 hours of postoperative antibiotics.   PT was consulted postop to assist with mobility and transfers.  The patient was allowed to be WBAT with therapy and was taught back precautions. Discharge planning was consulted to help with postop disposition and equipment needs.  Patient had a fair night on the evening of surgery and started to get up OOB with therapy on day one. Patient was seen in rounds and was ready to go home on day three. Discharge held on day 2 given adverse reaction to pain medications.  They were given discharge instructions and dressing directions.  They were instructed on when to follow up in the office with Dr. Shelle Iron.  Discharge Medications: Prior to Admission medications   Medication Sig Start Date End Date Taking? Authorizing Provider  atorvastatin (LIPITOR) 10 MG tablet Take 10 mg by mouth every morning.    Yes Historical Provider, MD  Cholecalciferol (VITAMIN D3 SUPER STRENGTH) 2000 UNITS TABS Take 2 tablets by mouth daily.   Yes Historical Provider, MD  fexofenadine (ALLEGRA) 180 MG tablet Take 90 mg by mouth daily. Takes 1/2 tablet   Yes Historical Provider, MD  fluticasone (FLONASE) 50 MCG/ACT nasal spray Place 2 sprays into the nose as needed.    Yes Historical Provider, MD  hydrochlorothiazide (HYDRODIURIL) 25 MG tablet Take 25 mg by  mouth every morning.     Yes Historical Provider, MD  JANUVIA 50 MG tablet Take 50 mg by mouth every morning.  08/30/13  Yes Historical Provider, MD  KRILL OIL PO Take 500 mg by mouth daily.    Yes Historical Provider, MD  lansoprazole (PREVACID) 15 MG capsule Take 15 mg by mouth daily.   Yes Historical Provider, MD  lisinopril (PRINIVIL,ZESTRIL) 5 MG tablet Take 5 mg by mouth every morning.    Yes Historical Provider, MD  aspirin 81 MG tablet Take 1 tablet (81 mg total) by mouth daily. 10/24/13   Dayna Barker. Bissell, PA-C  HYDROcodone-acetaminophen (NORCO) 5-325 MG per tablet Take 1-2 tablets by mouth every 6 (six) hours as needed for moderate pain. 10/22/13   Dayna Barker. Bissell, PA-C  methocarbamol (ROBAXIN) 500 MG tablet Take 1 tablet (500 mg total) by mouth every 6 (six) hours as needed for muscle spasms. 10/22/13   Dayna Barker. Christene Lye, PA-C    Diet: Diabetic diet Activity:WBAT Follow-up:in 10-14 days Disposition - Home Discharged Condition: good      Medication List         aspirin 81 MG tablet  Take 1 tablet (81 mg total) by mouth daily.     atorvastatin 10 MG tablet  Commonly known as:  LIPITOR  Take 10 mg by mouth every morning.     fexofenadine 180 MG tablet  Commonly known as:  ALLEGRA  Take 90 mg by mouth daily. Takes 1/2 tablet     fluticasone 50 MCG/ACT nasal spray  Commonly known as:  FLONASE  Place 2 sprays into the nose as needed.     hydrochlorothiazide 25 MG tablet  Commonly known as:  HYDRODIURIL  Take 25 mg by mouth every morning.     HYDROcodone-acetaminophen 5-325 MG per tablet  Commonly known as:  NORCO  Take 1-2 tablets by mouth every 6 (six) hours as needed for moderate pain.     JANUVIA 50 MG tablet  Generic drug:  sitaGLIPtin  Take 50 mg by mouth every morning.     KRILL OIL PO  Take 500 mg by mouth daily.     lansoprazole 15 MG capsule  Commonly known as:  PREVACID  Take 15 mg by mouth daily.     lisinopril 5 MG tablet  Commonly known as:  PRINIVIL,ZESTRIL  Take  5 mg by mouth every morning.     methocarbamol 500 MG tablet  Commonly known as:  ROBAXIN  Take 1 tablet (500 mg total) by mouth every 6 (six) hours as needed for muscle spasms.     VITAMIN D3 SUPER STRENGTH 2000 UNITS Tabs  Generic drug:  Cholecalciferol  Take 2 tablets by mouth daily.           Follow-up Information   Follow up with BEANE,JEFFREY C, MD In 2 weeks.   Specialty:  Orthopedic Surgery   Contact information:   [address] [place] [address] [phone]       Signed: Dorothy Spark. 10/25/2013, 10:03 PM

## 2013-11-12 DIAGNOSIS — L538 Other specified erythematous conditions: Secondary | ICD-10-CM | POA: Diagnosis not present

## 2013-11-17 DIAGNOSIS — M545 Low back pain, unspecified: Secondary | ICD-10-CM | POA: Diagnosis not present

## 2013-11-20 DIAGNOSIS — M545 Low back pain, unspecified: Secondary | ICD-10-CM | POA: Diagnosis not present

## 2013-11-25 DIAGNOSIS — M545 Low back pain, unspecified: Secondary | ICD-10-CM | POA: Diagnosis not present

## 2013-12-08 DIAGNOSIS — M545 Low back pain, unspecified: Secondary | ICD-10-CM | POA: Diagnosis not present

## 2013-12-11 DIAGNOSIS — M545 Low back pain, unspecified: Secondary | ICD-10-CM | POA: Diagnosis not present

## 2013-12-15 DIAGNOSIS — M545 Low back pain, unspecified: Secondary | ICD-10-CM | POA: Diagnosis not present

## 2013-12-18 DIAGNOSIS — M545 Low back pain, unspecified: Secondary | ICD-10-CM | POA: Diagnosis not present

## 2013-12-23 DIAGNOSIS — E119 Type 2 diabetes mellitus without complications: Secondary | ICD-10-CM | POA: Diagnosis not present

## 2013-12-23 DIAGNOSIS — E785 Hyperlipidemia, unspecified: Secondary | ICD-10-CM | POA: Diagnosis not present

## 2013-12-25 DIAGNOSIS — E119 Type 2 diabetes mellitus without complications: Secondary | ICD-10-CM | POA: Diagnosis not present

## 2013-12-25 DIAGNOSIS — E669 Obesity, unspecified: Secondary | ICD-10-CM | POA: Diagnosis not present

## 2013-12-25 DIAGNOSIS — N183 Chronic kidney disease, stage 3 unspecified: Secondary | ICD-10-CM | POA: Diagnosis not present

## 2013-12-25 DIAGNOSIS — E785 Hyperlipidemia, unspecified: Secondary | ICD-10-CM | POA: Diagnosis not present

## 2014-01-26 DIAGNOSIS — Z1231 Encounter for screening mammogram for malignant neoplasm of breast: Secondary | ICD-10-CM | POA: Diagnosis not present

## 2014-03-31 DIAGNOSIS — K2100 Gastro-esophageal reflux disease with esophagitis, without bleeding: Secondary | ICD-10-CM | POA: Diagnosis not present

## 2014-03-31 DIAGNOSIS — R143 Flatulence: Secondary | ICD-10-CM | POA: Diagnosis not present

## 2014-03-31 DIAGNOSIS — R141 Gas pain: Secondary | ICD-10-CM | POA: Diagnosis not present

## 2014-03-31 DIAGNOSIS — R198 Other specified symptoms and signs involving the digestive system and abdomen: Secondary | ICD-10-CM | POA: Diagnosis not present

## 2014-04-09 DIAGNOSIS — E119 Type 2 diabetes mellitus without complications: Secondary | ICD-10-CM | POA: Diagnosis not present

## 2014-05-04 DIAGNOSIS — R141 Gas pain: Secondary | ICD-10-CM | POA: Diagnosis not present

## 2014-05-04 DIAGNOSIS — R198 Other specified symptoms and signs involving the digestive system and abdomen: Secondary | ICD-10-CM | POA: Diagnosis not present

## 2014-06-04 DIAGNOSIS — K5900 Constipation, unspecified: Secondary | ICD-10-CM | POA: Diagnosis not present

## 2014-06-04 DIAGNOSIS — R198 Other specified symptoms and signs involving the digestive system and abdomen: Secondary | ICD-10-CM | POA: Diagnosis not present

## 2014-06-04 DIAGNOSIS — K5730 Diverticulosis of large intestine without perforation or abscess without bleeding: Secondary | ICD-10-CM | POA: Diagnosis not present

## 2014-06-17 ENCOUNTER — Other Ambulatory Visit (HOSPITAL_COMMUNITY): Payer: Self-pay | Admitting: Orthopedic Surgery

## 2014-06-17 DIAGNOSIS — M1710 Unilateral primary osteoarthritis, unspecified knee: Secondary | ICD-10-CM | POA: Diagnosis not present

## 2014-06-17 DIAGNOSIS — M2341 Loose body in knee, right knee: Secondary | ICD-10-CM

## 2014-06-17 DIAGNOSIS — Z96659 Presence of unspecified artificial knee joint: Secondary | ICD-10-CM | POA: Diagnosis not present

## 2014-06-17 DIAGNOSIS — T8489XA Other specified complication of internal orthopedic prosthetic devices, implants and grafts, initial encounter: Secondary | ICD-10-CM | POA: Diagnosis not present

## 2014-06-23 DIAGNOSIS — Z6835 Body mass index (BMI) 35.0-35.9, adult: Secondary | ICD-10-CM | POA: Diagnosis not present

## 2014-06-23 DIAGNOSIS — E669 Obesity, unspecified: Secondary | ICD-10-CM | POA: Diagnosis not present

## 2014-06-23 DIAGNOSIS — N183 Chronic kidney disease, stage 3 unspecified: Secondary | ICD-10-CM | POA: Diagnosis not present

## 2014-06-23 DIAGNOSIS — E119 Type 2 diabetes mellitus without complications: Secondary | ICD-10-CM | POA: Diagnosis not present

## 2014-07-01 ENCOUNTER — Encounter (HOSPITAL_COMMUNITY)
Admission: RE | Admit: 2014-07-01 | Discharge: 2014-07-01 | Disposition: A | Discharge status: Home / Self Care | Payer: Medicare Other | Source: Ambulatory Visit | Attending: Orthopedic Surgery | Admitting: Orthopedic Surgery

## 2014-07-01 DIAGNOSIS — M2341 Loose body in knee, right knee: Secondary | ICD-10-CM

## 2014-07-01 DIAGNOSIS — M2340 Loose body in knee, unspecified knee: Secondary | ICD-10-CM | POA: Diagnosis not present

## 2014-07-01 DIAGNOSIS — Z96659 Presence of unspecified artificial knee joint: Secondary | ICD-10-CM | POA: Diagnosis not present

## 2014-07-01 DIAGNOSIS — M25569 Pain in unspecified knee: Secondary | ICD-10-CM | POA: Diagnosis not present

## 2014-07-01 MED ORDER — TECHNETIUM TC 99M MEDRONATE IV KIT
24.8000 | PACK | Freq: Once | INTRAVENOUS | Status: AC | PRN
  Administered 2014-07-01: 24.8 via INTRAVENOUS

## 2014-07-08 DIAGNOSIS — M1710 Unilateral primary osteoarthritis, unspecified knee: Secondary | ICD-10-CM | POA: Diagnosis not present

## 2014-07-10 DIAGNOSIS — Z96659 Presence of unspecified artificial knee joint: Secondary | ICD-10-CM | POA: Diagnosis not present

## 2014-07-15 DIAGNOSIS — M1710 Unilateral primary osteoarthritis, unspecified knee: Secondary | ICD-10-CM | POA: Diagnosis not present

## 2014-07-24 DIAGNOSIS — M1710 Unilateral primary osteoarthritis, unspecified knee: Secondary | ICD-10-CM | POA: Diagnosis not present

## 2014-08-06 DIAGNOSIS — Z1331 Encounter for screening for depression: Secondary | ICD-10-CM | POA: Diagnosis not present

## 2014-08-06 DIAGNOSIS — E559 Vitamin D deficiency, unspecified: Secondary | ICD-10-CM | POA: Diagnosis not present

## 2014-08-06 DIAGNOSIS — I10 Essential (primary) hypertension: Secondary | ICD-10-CM | POA: Diagnosis not present

## 2014-08-06 DIAGNOSIS — I890 Lymphedema, not elsewhere classified: Secondary | ICD-10-CM | POA: Diagnosis not present

## 2014-08-06 DIAGNOSIS — Z0000 Encounter for general adult medical examination without abnormal findings: Secondary | ICD-10-CM | POA: Diagnosis not present

## 2014-08-06 DIAGNOSIS — Z23 Encounter for immunization: Secondary | ICD-10-CM | POA: Diagnosis not present

## 2014-08-06 DIAGNOSIS — E782 Mixed hyperlipidemia: Secondary | ICD-10-CM | POA: Diagnosis not present

## 2014-08-06 DIAGNOSIS — N183 Chronic kidney disease, stage 3 unspecified: Secondary | ICD-10-CM | POA: Diagnosis not present

## 2014-08-18 DIAGNOSIS — M949 Disorder of cartilage, unspecified: Secondary | ICD-10-CM | POA: Diagnosis not present

## 2014-08-18 DIAGNOSIS — M899 Disorder of bone, unspecified: Secondary | ICD-10-CM | POA: Diagnosis not present

## 2014-11-04 DIAGNOSIS — M1712 Unilateral primary osteoarthritis, left knee: Secondary | ICD-10-CM | POA: Diagnosis not present

## 2014-11-05 DIAGNOSIS — E559 Vitamin D deficiency, unspecified: Secondary | ICD-10-CM | POA: Diagnosis not present

## 2014-12-08 ENCOUNTER — Telehealth: Payer: Self-pay

## 2014-12-08 NOTE — Telephone Encounter (Signed)
[facility]iac [facility] fax # [phone] attn: Imelda PillowWendy Caton

## 2014-12-10 DIAGNOSIS — M1712 Unilateral primary osteoarthritis, left knee: Secondary | ICD-10-CM | POA: Diagnosis not present

## 2015-01-05 ENCOUNTER — Encounter: Payer: Self-pay | Admitting: Interventional Cardiology

## 2015-01-05 ENCOUNTER — Ambulatory Visit (INDEPENDENT_AMBULATORY_CARE_PROVIDER_SITE_OTHER): Payer: Medicare Other | Admitting: Interventional Cardiology

## 2015-01-05 VITALS — BP 140/70 | HR 108 | Wt 167.1 lb

## 2015-01-05 DIAGNOSIS — I471 Supraventricular tachycardia: Secondary | ICD-10-CM | POA: Diagnosis not present

## 2015-01-05 DIAGNOSIS — R634 Abnormal weight loss: Secondary | ICD-10-CM | POA: Diagnosis not present

## 2015-01-05 DIAGNOSIS — Z01810 Encounter for preprocedural cardiovascular examination: Secondary | ICD-10-CM | POA: Insufficient documentation

## 2015-01-05 DIAGNOSIS — R000 Tachycardia, unspecified: Secondary | ICD-10-CM

## 2015-01-05 LAB — TSH: TSH: 1.62 u[IU]/mL (ref 0.35–4.50)

## 2015-01-05 NOTE — Patient Instructions (Addendum)
Your physician recommends that you have lab today--TSH.  Dr Katrinka BlazingSmith has cleared you for knee surgery. He will send a note to Dr Lequita HaltAluisio.   Your physician recommends that you schedule a follow-up appointment as needed with Dr Katrinka BlazingSmith.

## 2015-01-05 NOTE — Progress Notes (Signed)
Patient ID: Holly Fields, female   DOB: 12/11/1940, 74 y.o.   MRN: [id number]    Cardiology Office Note   Date:  01/05/2015   ID:  Holly Fields, DOB 06/21/1941, MRN [id number]  PCP:  Holly AlkenBARNES,ELIZABETH STEWART, MD  Cardiologist:   Holly NoeSMITH III,Michaeline Eckersley W, MD   No chief complaint on file.     History of Present Illness: Holly Fields is a 74 y.o. female who presents for preoperative cardiovascular exam. The patient has no cardiovascular history. She does have essential hypertension, she is diabetic, and there is a history of stage II kidney disease. She has no orthopnea, PND, or chest pain. She has upcoming left total knee replacement by Holly Fields.    Past Medical History  Diagnosis Date  . Vitamin D deficiency   . Hypercholesteremia   . HTN (hypertension)   . Lymphedema of leg     right more than left  . Osteopenia   . Edema   . Diabetes   . CKD (chronic kidney disease)      saw dr Lowell Guitarpowell 2 years ago, now released from nephrology  . Vertigo     at times, cannot turn on left side quickly or sleep on left side  . Eczema     at times  . Complication of anesthesia 2010    "sluggish after anesthesia and had vertigo", slow to awaken after knee artroscopy 2012  . PONV (postoperative nausea and vomiting)     pt has n/v and vertigo after anesthesia  . Narrowing of airway 2010    Past Surgical History  Procedure Laterality Date  . Nasal sinus surgery  yrs ago     x 2  . Arthroscopic knee surgery Left 2012  . Joint replacement Right 2010  . Lumbar laminectomy/decompression microdiscectomy N/A 10/22/2013    Procedure: MICROLUMBAR DECOMPRESSION LUMBAR TWO TO THREE, LUMBAR THREE TO FOUR and faramenotomy two,threeand four ,five bilateral;  Surgeon: Holly DockerJeffrey C Beane, MD;  Location: [facility];  Service: Orthopedics;  Laterality: N/A;     Current Outpatient Prescriptions  Medication Sig Dispense Refill  . aspirin 81 MG tablet Take 1 tablet (81 mg total) by mouth daily. 30 tablet   .  atorvastatin (LIPITOR) 10 MG tablet Take 10 mg by mouth every morning.     . Cholecalciferol (VITAMIN D3 SUPER STRENGTH) 2000 UNITS TABS Take 2 tablets by mouth daily.    . fexofenadine (ALLEGRA) 180 MG tablet Take 90 mg by mouth as needed for allergies. Takes 1/2 tablet    . fluticasone (FLONASE) 50 MCG/ACT nasal spray Place 2 sprays into the nose as needed for allergies.     . hydrochlorothiazide (HYDRODIURIL) 25 MG tablet Take 25 mg by mouth every morning.     [name] Kitchen. JANUVIA 50 MG tablet Take 50 mg by mouth every morning.     [name] Kitchen. KRILL OIL PO Take 500 mg by mouth daily.     . lansoprazole (PREVACID) 15 MG capsule Take 15 mg by mouth daily.    [name] Kitchen. lisinopril (PRINIVIL,ZESTRIL) 5 MG tablet Take 5 mg by mouth every morning.      No current facility-administered medications for this visit.    Allergies:   Adenosine; Augmentin; Codeine; Doxycycline; Metformin and related; Oxycodone; and Tramadol    Social History:  The patient  reports that she quit smoking about 38 years ago. Her smoking use included Cigarettes. She has a 3.75 pack-year smoking history. She has never used smokeless tobacco. She reports that she  drinks alcohol. She reports that she does not use illicit drugs.   Family History:  The patient's family history includes Arrhythmia in her father and mother; Heart attack in her father; Heart failure in her father; Hyperlipidemia in her mother; Hypertension in her mother.    ROS:  Please see the history of present illness.   Otherwise, review of systems are positive for stiff right knee, lymphedema right lower extremity following knee replacement. .   All other systems are reviewed and negative.    PHYSICAL EXAM: VS:  BP 140/70 mmHg  Pulse 108  Wt 167 lb 1.9 oz (75.805 kg)  SpO2 97% , BMI Body mass index is 31.07 kg/(m^2). GEN: Well nourished, well developed, in no acute distress HEENT: normal Neck: no JVD, carotid bruits, or masses Cardiac: RRR; no murmurs, rubs, or gallops,no edema    Respiratory:  clear to auscultation bilaterally, normal work of breathing GI: soft, nontender, nondistended, + BS MS: no deformity or atrophy Skin: warm and dry, no rash Neuro:  Strength and sensation are intact Psych: euthymic mood, full affect   EKG:  EKG is ordered today. The ekg ordered today demonssinus tachycardia 108 bpm, otherwise unremarkable. Recent Labs: No results found for requested labs within last 365 days.    Lipid Panel No results found for: CHOL, TRIG, HDL, CHOLHDL, VLDL, LDLCALC, LDLDIRECT    Wt Readings from Last 3 Encounters:  01/05/15 167 lb 1.9 oz (75.805 kg)  10/22/13 189 lb 12.8 oz (86.093 kg)  09/22/13 190 lb (86.183 kg)      Other studies Reviewed: Additional studies/ records that were reviewed today include: . Review of the above records demonstrate:  ASSESSMENT AND PLAN:  1.  Preoperative cardiovascular exam, unremarkable with the exception of asymptomatic sinus tachycardia. At this point the patient is cleared for upcoming left knee replacement surgery by Dr. Lequita Halt. I will check a thyroid stimulating hormone to rule out the possibility of hyperthyroidism given the weight loss and sinus tachycardia. No specific cardiovascular evaluation is necessary. 2. Sinus tachycardia 3. 20 pound weight loss over the past year, which patient states is intentional. Rule out hyperthyroidism given the sinus tachycardia.   Current medicines are reviewed at length with the patient today.  The patient does not have concerns regarding medicines.  The following changes have been made:  no change  Labs/ tests ordered today include:   Orders Placed This Encounter  Procedures  . TSH  . EKG 12-Lead     Disposition:   FU with  Holly Fields as needed. Signed, Holly Noe, MD  01/05/2015 10:31 AM    [facility] [address] [place], [place], [place]  [address] Phone: [phone]; Fax: [phone]

## 2015-01-06 ENCOUNTER — Telehealth: Payer: Self-pay | Admitting: Interventional Cardiology

## 2015-01-06 NOTE — Telephone Encounter (Signed)
pt aware of lab results. Thyroid is normal

## 2015-01-06 NOTE — Telephone Encounter (Signed)
New Message  Pt returning Pam's phone call concerning lab results.  Pt stated, a vm can be left on listed #. Please call back and discuss.

## 2015-01-06 NOTE — Telephone Encounter (Signed)
-----   Message from Lesleigh NoeHenry W Smith III, MD sent at 01/05/2015  1:27 PM EST ----- Thyroid is normal

## 2015-01-14 ENCOUNTER — Other Ambulatory Visit: Payer: Self-pay | Admitting: Gastroenterology

## 2015-01-14 DIAGNOSIS — K219 Gastro-esophageal reflux disease without esophagitis: Secondary | ICD-10-CM | POA: Diagnosis not present

## 2015-01-14 DIAGNOSIS — R140 Abdominal distension (gaseous): Secondary | ICD-10-CM | POA: Diagnosis not present

## 2015-01-14 DIAGNOSIS — R109 Unspecified abdominal pain: Secondary | ICD-10-CM | POA: Diagnosis not present

## 2015-01-14 DIAGNOSIS — R1084 Generalized abdominal pain: Secondary | ICD-10-CM

## 2015-01-21 ENCOUNTER — Ambulatory Visit
Admission: RE | Admit: 2015-01-21 | Discharge: 2015-01-21 | Disposition: A | Discharge status: Home / Self Care | Payer: Medicare Other | Source: Ambulatory Visit | Attending: Gastroenterology | Admitting: Gastroenterology

## 2015-01-21 DIAGNOSIS — R1084 Generalized abdominal pain: Secondary | ICD-10-CM

## 2015-01-21 DIAGNOSIS — R140 Abdominal distension (gaseous): Secondary | ICD-10-CM | POA: Diagnosis not present

## 2015-01-28 DIAGNOSIS — M1712 Unilateral primary osteoarthritis, left knee: Secondary | ICD-10-CM | POA: Diagnosis not present

## 2015-01-29 DIAGNOSIS — Z1231 Encounter for screening mammogram for malignant neoplasm of breast: Secondary | ICD-10-CM | POA: Diagnosis not present

## 2015-02-09 ENCOUNTER — Ambulatory Visit: Payer: Self-pay | Admitting: Orthopedic Surgery

## 2015-02-09 NOTE — Progress Notes (Signed)
Preoperative surgical orders have been place into the Epic hospital system for Holly Fields on 02/09/2015, 10:06 AM  by Patrica DuelPERKINS, Jailon Schaible for surgery on 03-01-2015.  Preop Total Knee orders including Experal, IV Tylenol, and IV Decadron as long as there are no contraindications to the above medications. Avel Peacerew Jirah Rider, PA-C

## 2015-02-16 ENCOUNTER — Ambulatory Visit: Payer: Self-pay | Admitting: Orthopedic Surgery

## 2015-02-17 ENCOUNTER — Other Ambulatory Visit (HOSPITAL_COMMUNITY)
Admission: RE | Admit: 2015-02-17 | Discharge: 2015-02-17 | Disposition: A | Discharge status: Home / Self Care | Payer: Medicare Other | Source: Ambulatory Visit | Attending: Family Medicine | Admitting: Family Medicine

## 2015-02-17 DIAGNOSIS — B9562 Methicillin resistant Staphylococcus aureus infection as the cause of diseases classified elsewhere: Secondary | ICD-10-CM | POA: Diagnosis not present

## 2015-02-17 NOTE — Patient Instructions (Addendum)
Holly Fields  02/17/2015   Your procedure is scheduled on: 02/28/14   Report to [facility]  Entrance and follow signs to               Short Stay Center at 11:45 AM.   Call this number if you have problems the morning of surgery [phone]   Remember:  Do not eat food  :After Midnight.                      MAY HAVE CLEAR LIQUIDS UNTIL 8:45 AM    CLEAR LIQUID DIET   Foods Allowed                                                                     Foods Excluded  Coffee and tea, regular and decaf                             liquids that you cannot  Plain Jell-O in any flavor                                             see through such as: Fruit ices (not with fruit pulp)                                     milk, soups, orange juice  Iced Popsicles                                    All solid food Carbonated beverages, regular and diet                                    Cranberry, grape and apple juices Sports drinks like Gatorade Lightly seasoned clear broth or consume(fat free) Sugar, honey syrup   _____________________________________________________________________   Take these medicines the morning of surgery with A SIP OF WATER: PANTOPRAZOLE / MAY USE EYE DROPS IF NEEDED                               You may not have any metal on your body including hair pins and              piercings  Do not wear jewelry, make-up, lotions, powders or perfumes.             Do not wear nail polish.  Do not shave  48 hours prior to surgery.              Men may shave face and neck.   Do not bring valuables to the hospital. [facility] IS NOT             RESPONSIBLE   FOR VALUABLES.  Contacts, dentures or bridgework may not be worn into surgery.  Leave suitcase in the car. After surgery it may be brought to your room.     Patients discharged the day of surgery will not be allowed to drive home.  Name and phone number of your driver:  Special  Instructions: N/A              Please read over the following fact sheets you were given: _____________________________________________________________________                                                     [place]  Before surgery, you can play an important role.  Because skin is not sterile, your skin needs to be as free of germs as possible.  You can reduce the number of germs on your skin by washing with CHG (chlorahexidine gluconate) soap before surgery.  CHG is an antiseptic cleaner which kills germs and bonds with the skin to continue killing germs even after washing. Please DO NOT use if you have an allergy to CHG or antibacterial soaps.  If your skin becomes reddened/irritated stop using the CHG and inform your nurse when you arrive at Short Stay. Do not shave (including legs and underarms) for at least 48 hours prior to the first CHG shower.  You may shave your face. Please follow these instructions carefully:   1.  Shower with CHG Soap the night before surgery and the  morning of Surgery.   2.  If you choose to wash your hair, wash your hair first as usual with your  normal  Shampoo.   3.  After you shampoo, rinse your hair and body thoroughly to remove the  shampoo.                                         4.  Use CHG as you would any other liquid soap.  You can apply chg directly  to the skin and wash . Gently wash with scrungie or clean wascloth    5.  Apply the CHG Soap to your body ONLY FROM THE NECK DOWN.   Do not use on open                           Wound or open sores. Avoid contact with eyes, ears mouth and genitals (private parts).                        Genitals (private parts) with your normal soap.              6.  Wash thoroughly, paying special attention to the area where your surgery  will be performed.   7.  Thoroughly rinse your body with warm water from the neck down.   8.  DO NOT shower/wash with your normal soap after  using and rinsing off  the CHG Soap .                9.  Pat yourself dry with a clean towel.             10.  Wear clean pajamas.  11.  Place clean sheets on your bed the night of your first shower and do not  sleep with pets.  Day of Surgery : Do not apply any lotions/deodorants the morning of surgery.  Please wear clean clothes to the hospital/surgery center.  FAILURE TO FOLLOW THESE INSTRUCTIONS MAY RESULT IN THE CANCELLATION OF YOUR SURGERY    PATIENT SIGNATURE_________________________________  ______________________________________________________________________     Holly Fields  An incentive spirometer is a tool that can help keep your lungs clear and active. This tool measures how well you are filling your lungs with each breath. Taking long deep breaths may help reverse or decrease the chance of developing breathing (pulmonary) problems (especially infection) following:  A long period of time when you are unable to move or be active. BEFORE THE PROCEDURE   If the spirometer includes an indicator to show your best effort, your nurse or respiratory therapist will set it to a desired goal.  If possible, sit up straight or lean slightly forward. Try not to slouch.  Hold the incentive spirometer in an upright position. INSTRUCTIONS FOR USE   Sit on the edge of your bed if possible, or sit up as far as you can in bed or on a chair.  Hold the incentive spirometer in an upright position.  Breathe out normally.  Place the mouthpiece in your mouth and seal your lips tightly around it.  Breathe in slowly and as deeply as possible, raising the piston or the ball toward the top of the column.  Hold your breath for 3-5 seconds or for as long as possible. Allow the piston or ball to fall to the bottom of the column.  Remove the mouthpiece from your mouth and breathe out normally.  Rest for a few seconds and repeat Steps 1 through 7 at least 10 times  every 1-2 hours when you are awake. Take your time and take a few normal breaths between deep breaths.  The spirometer may include an indicator to show your best effort. Use the indicator as a goal to work toward during each repetition.  After each set of 10 deep breaths, practice coughing to be sure your lungs are clear. If you have an incision (the cut made at the time of surgery), support your incision when coughing by placing a pillow or rolled up towels firmly against it. Once you are able to get out of bed, walk around indoors and cough well. You may stop using the incentive spirometer when instructed by your caregiver.  RISKS AND COMPLICATIONS  Take your time so you do not get dizzy or light-headed.  If you are in pain, you may need to take or ask for pain medication before doing incentive spirometry. It is harder to take a deep breath if you are having pain. AFTER USE  Rest and breathe slowly and easily.  It can be helpful to keep track of a log of your progress. Your caregiver can provide you with a simple table to help with this. If you are using the spirometer at home, follow these instructions: [place] IF:   You are having difficultly using the spirometer.  You have trouble using the spirometer as often as instructed.  Your pain medication is not giving enough relief while using the spirometer.  You develop fever of 100.5 F (38.1 C) or higher. SEEK IMMEDIATE MEDICAL CARE IF:   You cough up bloody sputum that had not been present before.  You develop fever of 102 F (  38.9 C) or greater.  You develop worsening pain at or near the incision site. MAKE SURE YOU:   Understand these instructions.  Will watch your condition.  Will get help right away if you are not doing well or get worse. Document Released: 04/02/2007 Document Revised: 02/12/2012 Document Reviewed: 06/03/2007 ExitCare Patient Information 2014 ExitCare,  [place].   ________________________________________________________________________  WHAT IS A BLOOD TRANSFUSION? Blood Transfusion Information  A transfusion is the replacement of blood or some of its parts. Blood is made up of multiple cells which provide different functions.  Red blood cells carry oxygen and are used for blood loss replacement.  White blood cells fight against infection.  Platelets control bleeding.  Plasma helps clot blood.  Other blood products are available for specialized needs, such as hemophilia or other clotting disorders. BEFORE THE TRANSFUSION  Who gives blood for transfusions?   Healthy volunteers who are fully evaluated to make sure their blood is safe. This is blood bank blood. Transfusion therapy is the safest it has ever been in the practice of medicine. Before blood is taken from a donor, a complete history is taken to make sure that person has no history of diseases nor engages in risky social behavior (examples are intravenous drug use or sexual activity with multiple partners). The donor's travel history is screened to minimize risk of transmitting infections, such as malaria. The donated blood is tested for signs of infectious diseases, such as HIV and hepatitis. The blood is then tested to be sure it is compatible with you in order to minimize the chance of a transfusion reaction. If you or a relative donates blood, this is often done in anticipation of surgery and is not appropriate for emergency situations. It takes many days to process the donated blood. RISKS AND COMPLICATIONS Although transfusion therapy is very safe and saves many lives, the main dangers of transfusion include:   Getting an infectious disease.  Developing a transfusion reaction. This is an allergic reaction to something in the blood you were given. Every precaution is taken to prevent this. The decision to have a blood transfusion has been considered carefully by your caregiver  before blood is given. Blood is not given unless the benefits outweigh the risks. AFTER THE TRANSFUSION  Right after receiving a blood transfusion, you will usually feel much better and more energetic. This is especially true if your red blood cells have gotten low (anemic). The transfusion raises the level of the red blood cells which carry oxygen, and this usually causes an energy increase.  The nurse administering the transfusion will monitor you carefully for complications. HOME CARE INSTRUCTIONS  No special instructions are needed after a transfusion. You may find your energy is better. Speak with your caregiver about any limitations on activity for underlying diseases you may have. SEEK MEDICAL CARE IF:   Your condition is not improving after your transfusion.  You develop redness or irritation at the intravenous (IV) site. SEEK IMMEDIATE MEDICAL CARE IF:  Any of the following symptoms occur over the next 12 hours:  Shaking chills.  You have a temperature by mouth above 102 F (38.9 C), not controlled by medicine.  Chest, back, or muscle pain.  People around you feel you are not acting correctly or are confused.  Shortness of breath or difficulty breathing.  Dizziness and fainting.  You get a rash or develop hives.  You have a decrease in urine output.  Your urine turns a dark color or changes to  pink, red, or brown. Any of the following symptoms occur over the next 10 days:  You have a temperature by mouth above 102 F (38.9 C), not controlled by medicine.  Shortness of breath.  Weakness after normal activity.  The white part of the eye turns yellow (jaundice).  You have a decrease in the amount of urine or are urinating less often.  Your urine turns a dark color or changes to pink, red, or brown. Document Released: 11/17/2000 Document Revised: 02/12/2012 Document Reviewed: 07/06/2008 [facility] Patient Information 2014 [place],  [place].  _______________________________________________________________________

## 2015-02-18 ENCOUNTER — Encounter (HOSPITAL_COMMUNITY)
Admission: RE | Admit: 2015-02-18 | Discharge: 2015-02-18 | Disposition: A | Discharge status: Home / Self Care | Payer: Medicare Other | Source: Ambulatory Visit | Attending: Orthopedic Surgery | Admitting: Orthopedic Surgery

## 2015-02-18 ENCOUNTER — Encounter (INDEPENDENT_AMBULATORY_CARE_PROVIDER_SITE_OTHER): Payer: Self-pay

## 2015-02-18 ENCOUNTER — Encounter (HOSPITAL_COMMUNITY): Payer: Self-pay

## 2015-02-18 DIAGNOSIS — Z01812 Encounter for preprocedural laboratory examination: Secondary | ICD-10-CM | POA: Diagnosis not present

## 2015-02-18 HISTORY — DX: Anesthesia of skin: R20.2

## 2015-02-18 HISTORY — DX: Unspecified osteoarthritis, unspecified site: M19.90

## 2015-02-18 HISTORY — DX: Gastro-esophageal reflux disease without esophagitis: K21.9

## 2015-02-18 HISTORY — DX: Paresthesia of skin: R20.0

## 2015-02-18 LAB — COMPREHENSIVE METABOLIC PANEL
ALBUMIN: 4.1 g/dL (ref 3.5–5.2)
ALT: 16 U/L (ref 0–35)
ANION GAP: 10 (ref 5–15)
AST: 23 U/L (ref 0–37)
Alkaline Phosphatase: 64 U/L (ref 39–117)
BUN: 18 mg/dL (ref 6–23)
CO2: 27 mmol/L (ref 19–32)
Calcium: 9.4 mg/dL (ref 8.4–10.5)
Chloride: 102 mmol/L (ref 96–112)
Creatinine, Ser: 1.55 mg/dL — ABNORMAL HIGH (ref 0.50–1.10)
GFR, EST AFRICAN AMERICAN: 38 mL/min — AB (ref >90)
GFR, EST NON AFRICAN AMERICAN: 33 mL/min — AB (ref >90)
Glucose, Bld: 88 mg/dL (ref 70–99)
POTASSIUM: 4.2 mmol/L (ref 3.5–5.1)
Sodium: 139 mmol/L (ref 135–145)
Total Bilirubin: 0.9 mg/dL (ref 0.3–1.2)
Total Protein: 7.3 g/dL (ref 6.0–8.3)

## 2015-02-18 LAB — CBC
HCT: 41.1 % (ref 36.0–46.0)
HEMOGLOBIN: 13 g/dL (ref 12.0–15.0)
MCH: 26.8 pg (ref 26.0–34.0)
MCHC: 31.6 g/dL (ref 30.0–36.0)
MCV: 84.7 fL (ref 78.0–100.0)
Platelets: 215 10*3/uL (ref 150–400)
RBC: 4.85 MIL/uL (ref 3.87–5.11)
RDW: 13.5 % (ref 11.5–15.5)
WBC: 9.4 10*3/uL (ref 4.0–10.5)

## 2015-02-18 LAB — URINALYSIS, ROUTINE W REFLEX MICROSCOPIC
Bilirubin Urine: NEGATIVE
Glucose, UA: NEGATIVE mg/dL
Hgb urine dipstick: NEGATIVE
KETONES UR: NEGATIVE mg/dL
NITRITE: NEGATIVE
Protein, ur: NEGATIVE mg/dL
Specific Gravity, Urine: 1.009 (ref 1.005–1.030)
UROBILINOGEN UA: 0.2 mg/dL (ref 0.0–1.0)
pH: 7 (ref 5.0–8.0)

## 2015-02-18 LAB — URINE MICROSCOPIC-ADD ON

## 2015-02-18 LAB — APTT: aPTT: 35 seconds (ref 24–37)

## 2015-02-18 LAB — SURGICAL PCR SCREEN
MRSA, PCR: NEGATIVE
Staphylococcus aureus: NEGATIVE

## 2015-02-18 LAB — PROTIME-INR
INR: 1.05 (ref 0.00–1.49)
PROTHROMBIN TIME: 13.8 s (ref 11.6–15.2)

## 2015-02-19 NOTE — Progress Notes (Signed)
CMET faxed to Dr. Lequita HaltAluisio

## 2015-02-28 ENCOUNTER — Ambulatory Visit: Payer: Self-pay | Admitting: Orthopedic Surgery

## 2015-02-28 NOTE — H&P (Signed)
Sterling J. Donaway DOB: 04/01/1941 Married / Language: English / Race: Black or African American Female Date of Admission:  03/01/2015 CC:  Left Knee Pain History of Present Illness The patient is a 74 year old female who comes in for a preoperative History and Physical. The patient is scheduled for a left total knee arthroplasty to be performed by Dr. Frank V. Aluisio, MD at [facility] on 03-01-2015. The patient is a 74 year old female who presents for follow up of their knee. The patient is being followed for their left knee pain and osteoarthritis. They are a couple months out from her most recent cortisone injection. Symptoms reported include: pain. The patient has reported improvement of their symptoms with: Cortisone injections. It lasted a couple of months. She had an appointment with Dr. Smith on Feb 2nd for her pre-operative clearance. She had been seen and told she could proceed with surgery. She has a history of problems with anesthesia, but the hospital researched the history and she had two Anesthesiologists that got the right combination with her back surgery in 2014. They assured her that it would be in her record and the same anesthetics could be used for the total knee. The knee has gotten has gotten progressively worse. She is at the stage where she feels he is ready to undergo the total knee arthroplasty. The knee is hurting at all times. It is limiting what she can and cannot do. She is ready to porceed now with the knee replacement. They have been treated conservatively in the past for the above stated problem and despite conservative measures, they continue to have progressive pain and severe functional limitations and dysfunction. They have failed non-operative management including home exercise, medications, and injections. It is felt that they would benefit from undergoing total joint replacement. Risks and benefits of the procedure have been discussed with the patient and  they elect to proceed with surgery. There are no active contraindications to surgery such as ongoing infection or rapidly progressive neurological disease.  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 Augmentin *PENICILLINS* Hives. TraMADol HCl *ANALGESICS - OPIOID* Nausea. sickness Adenosine *ANTIARRHYTHMICS* OxyCODONE HCl *ANALGESICS - OPIOID* Nausea. sickness Codeine Phosphate *ANALGESICS - OPIOID* she is able to take Vicodin in small quanities  Family History Heart disease in female family member before age 55 Heart Disease mother and father Diabetes Mellitus grandmother mothers side and grandfather fathers side Osteoarthritis father Hypertension First Degree Relatives. mother and father Father Heart Disease Mother Living.  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 40 years ago, former smoker; smoke(d) less than 1/2 pack(s) per day; uses less than half 1/2 can(s) smokeless per week Advance Directives Living Will, Healthcare POA  Medication History  Januvia (50MG Tablet, Oral) Active. (qd) Krill Oil Omega-3 (300MG Capsule, Oral) Active. (qd) Atorvastatin Calcium (10MG Tablet, Oral) Active. (qd) Ibuprofen (Oral) Specific dose unknown - Active. ((prn for knees) prn) Lisinopril (5MG Tablet, Oral) Active. (qd) Loratadine (10MG Tablet, Oral) Active.  (prn allergies) Hydrochlorothiazide (25MG Tablet,   Oral) Active. (qd) Vitamin D3 (2000UNIT Capsule, Oral) Active. (qd) Aspirin EC (81MG Tablet DR, Oral) Active. (qd) Pantoprazole Sodium (40MG Tablet DR, Oral) Active.  Past Surgical History Sinus Surgery Straighten Nasal Septum Breast Biopsy left Total Knee Replacement right Arthroscopy of Knee left  Past Medical History Diabetes Mellitus, Type II Lymphedema-lower extremeties Unilateral primary osteoarthritis, left knee (M17.12) Vertigo Hypertension Gastroesophageal Reflux Disease Osteopenia  Review of Systems General Not Present- Chills, Fatigue, Fever, Memory Loss, Night Sweats, Weight Gain and Weight Loss. Skin Not Present- Eczema, Hives, Itching, Lesions and Rash. HEENT Not Present- Dentures, Double Vision, Headache, Hearing Loss, Tinnitus and Visual Loss. Respiratory Not Present- Allergies, Chronic Cough, Coughing up blood, Shortness of breath at rest and Shortness of breath with exertion. Cardiovascular Not Present- Chest Pain, Difficulty Breathing Lying Down, Murmur, Palpitations, Racing/skipping heartbeats and Swelling. Gastrointestinal Present- Heartburn. Not Present- Abdominal Pain, Bloody Stool, Constipation, Diarrhea, Difficulty Swallowing, Jaundice, Loss of appetitie, Nausea and Vomiting. Female Genitourinary Present- Urinating at Night. Not Present- Blood in Urine, Discharge, Flank Pain, Incontinence, Painful Urination, Urgency, Urinary frequency, Urinary Retention and Weak urinary stream. Musculoskeletal Present- Joint Pain, Morning Stiffness, Muscle Pain and Muscle Weakness. Not Present- Back Pain, Joint Swelling and Spasms. Neurological Not Present- Blackout spells, Difficulty with balance, Dizziness, Paralysis, Tremor and Weakness. Psychiatric Not Present- Insomnia.  Vitals  Weight: 184 lb Height: 62in Height was reported by patient. Body Surface Area: 1.84 m Body Mass Index: 33.65 kg/m   BP: 118/64 (Sitting, Right Arm, Standard)  Physical Exam General Mental Status -Alert, cooperative and good historian. General Appearance-pleasant, Not in acute distress. Orientation-Oriented X3. Build & Nutrition-Well nourished and Well developed.  Head and Neck Head-normocephalic, atraumatic . Neck Global Assessment - supple, no bruit auscultated on the right, no bruit auscultated on the left.  Eye Vision-Wears corrective lenses. Pupil - Bilateral-Regular and Round. Motion - Bilateral-EOMI.  Chest and Lung Exam Auscultation Breath sounds - clear at anterior chest wall and clear at posterior chest wall. Adventitious sounds - No Adventitious sounds.  Cardiovascular Auscultation Rhythm - Regular rate and rhythm. Heart Sounds - S1 WNL and S2 WNL. Murmurs & Other Heart Sounds - Auscultation of the heart reveals - No Murmurs.  Abdomen Palpation/Percussion Tenderness - Abdomen is non-tender to palpation. Rigidity (guarding) - Abdomen is soft. Auscultation Auscultation of the abdomen reveals - Bowel sounds normal.  Female Genitourinary Note: Not done, not pertinent to present illness   Musculoskeletal Note: On exam, she is alert and oriented and in no apparent distress. Her left knee shows no effusion. Her range of motion is about 5 to 125. There is marked crepitus on range of motion and tender medial greater than lateral with no instability.  RADIOGRAPHS: We reviewed her radiographs again. She is bone on bone in the medial and patellofemoral compartments with varus deformity.  Assessment & Plan  Primary osteoarthritis of left knee (M17.12) Note:Surgical Plans: Left Total Knee Repalcement  Disposition: Home  PCP: Dr. Elizabeth Barnes Cardiology: Dr. Henry Smith - Patient has been seen preoperatively and felt to be stable for surgery.  IV TXA  Anesthesia Issues: Postop Nausea and "slow to wake up" after previous knee scope  procedure.  Signed electronically by Geran Haithcock L Rayya Yagi, III PA-C 

## 2015-03-01 ENCOUNTER — Inpatient Hospital Stay (HOSPITAL_COMMUNITY): Payer: Medicare Other | Admitting: Certified Registered"

## 2015-03-01 ENCOUNTER — Inpatient Hospital Stay (HOSPITAL_COMMUNITY)
Admission: RE | Admit: 2015-03-01 | Discharge: 2015-03-03 | DRG: 470 | Disposition: A | Discharge status: Home Health | Payer: Medicare Other | Source: Ambulatory Visit | Attending: Orthopedic Surgery | Admitting: Orthopedic Surgery

## 2015-03-01 ENCOUNTER — Encounter (HOSPITAL_COMMUNITY): Payer: Self-pay | Admitting: *Deleted

## 2015-03-01 ENCOUNTER — Encounter (HOSPITAL_COMMUNITY)
Admission: RE | Disposition: A | Discharge status: Home Health | Payer: Self-pay | Source: Ambulatory Visit | Attending: Orthopedic Surgery

## 2015-03-01 DIAGNOSIS — Z96651 Presence of right artificial knee joint: Secondary | ICD-10-CM | POA: Diagnosis present

## 2015-03-01 DIAGNOSIS — K219 Gastro-esophageal reflux disease without esophagitis: Secondary | ICD-10-CM | POA: Diagnosis not present

## 2015-03-01 DIAGNOSIS — M8580 Other specified disorders of bone density and structure, unspecified site: Secondary | ICD-10-CM | POA: Diagnosis present

## 2015-03-01 DIAGNOSIS — I129 Hypertensive chronic kidney disease with stage 1 through stage 4 chronic kidney disease, or unspecified chronic kidney disease: Secondary | ICD-10-CM | POA: Diagnosis present

## 2015-03-01 DIAGNOSIS — Z87891 Personal history of nicotine dependence: Secondary | ICD-10-CM

## 2015-03-01 DIAGNOSIS — E780 Pure hypercholesterolemia: Secondary | ICD-10-CM | POA: Diagnosis present

## 2015-03-01 DIAGNOSIS — Z7982 Long term (current) use of aspirin: Secondary | ICD-10-CM | POA: Diagnosis not present

## 2015-03-01 DIAGNOSIS — Z8249 Family history of ischemic heart disease and other diseases of the circulatory system: Secondary | ICD-10-CM

## 2015-03-01 DIAGNOSIS — M179 Osteoarthritis of knee, unspecified: Secondary | ICD-10-CM | POA: Diagnosis present

## 2015-03-01 DIAGNOSIS — M1712 Unilateral primary osteoarthritis, left knee: Principal | ICD-10-CM | POA: Diagnosis present

## 2015-03-01 DIAGNOSIS — N189 Chronic kidney disease, unspecified: Secondary | ICD-10-CM | POA: Diagnosis not present

## 2015-03-01 DIAGNOSIS — Z79899 Other long term (current) drug therapy: Secondary | ICD-10-CM

## 2015-03-01 DIAGNOSIS — R110 Nausea: Secondary | ICD-10-CM | POA: Diagnosis not present

## 2015-03-01 DIAGNOSIS — Z01812 Encounter for preprocedural laboratory examination: Secondary | ICD-10-CM | POA: Diagnosis not present

## 2015-03-01 DIAGNOSIS — Z833 Family history of diabetes mellitus: Secondary | ICD-10-CM | POA: Diagnosis not present

## 2015-03-01 DIAGNOSIS — Z96652 Presence of left artificial knee joint: Secondary | ICD-10-CM

## 2015-03-01 DIAGNOSIS — M1710 Unilateral primary osteoarthritis, unspecified knee: Secondary | ICD-10-CM | POA: Diagnosis present

## 2015-03-01 DIAGNOSIS — E119 Type 2 diabetes mellitus without complications: Secondary | ICD-10-CM | POA: Diagnosis not present

## 2015-03-01 DIAGNOSIS — E559 Vitamin D deficiency, unspecified: Secondary | ICD-10-CM | POA: Diagnosis not present

## 2015-03-01 DIAGNOSIS — M25562 Pain in left knee: Secondary | ICD-10-CM | POA: Diagnosis not present

## 2015-03-01 HISTORY — PX: TOTAL KNEE ARTHROPLASTY: SHX125

## 2015-03-01 LAB — GLUCOSE, CAPILLARY
GLUCOSE-CAPILLARY: 101 mg/dL — AB (ref 70–99)
GLUCOSE-CAPILLARY: 69 mg/dL — AB (ref 70–99)
Glucose-Capillary: 69 mg/dL — ABNORMAL LOW (ref 70–99)
Glucose-Capillary: 158 mg/dL — ABNORMAL HIGH (ref 70–99)

## 2015-03-01 LAB — TYPE AND SCREEN
ABO/RH(D): B POS
Antibody Screen: NEGATIVE

## 2015-03-01 SURGERY — ARTHROPLASTY, KNEE, TOTAL
Anesthesia: Spinal | Site: Knee | Laterality: Left

## 2015-03-01 MED ORDER — LIDOCAINE HCL (CARDIAC) 20 MG/ML IV SOLN
INTRAVENOUS | Status: DC | PRN
  Administered 2015-03-01: 20 mg via INTRAVENOUS

## 2015-03-01 MED ORDER — METOCLOPRAMIDE HCL 5 MG/ML IJ SOLN
5.0000 mg | Freq: Three times a day (TID) | INTRAMUSCULAR | Status: DC | PRN
  Administered 2015-03-01 – 2015-03-02 (×3): 10 mg via INTRAVENOUS
  Filled 2015-03-01 (×3): qty 2

## 2015-03-01 MED ORDER — KETOROLAC TROMETHAMINE 15 MG/ML IJ SOLN
7.5000 mg | Freq: Four times a day (QID) | INTRAMUSCULAR | Status: AC
  Administered 2015-03-01: via INTRAVENOUS
  Administered 2015-03-02 (×2): 7.5 mg via INTRAVENOUS
  Filled 2015-03-01 (×3): qty 1

## 2015-03-01 MED ORDER — DEXAMETHASONE SODIUM PHOSPHATE 10 MG/ML IJ SOLN
INTRAMUSCULAR | Status: AC
  Filled 2015-03-01: qty 1

## 2015-03-01 MED ORDER — TRANEXAMIC ACID 100 MG/ML IV SOLN
1000.0000 mg | INTRAVENOUS | Status: AC
  Administered 2015-03-01: 1000 mg via INTRAVENOUS
  Filled 2015-03-01: qty 10

## 2015-03-01 MED ORDER — DEXAMETHASONE SODIUM PHOSPHATE 10 MG/ML IJ SOLN
10.0000 mg | Freq: Once | INTRAMUSCULAR | Status: DC
  Filled 2015-03-01: qty 1

## 2015-03-01 MED ORDER — BUPIVACAINE LIPOSOME 1.3 % IJ SUSP
INTRAMUSCULAR | Status: DC | PRN
  Administered 2015-03-01: 20 mL

## 2015-03-01 MED ORDER — SODIUM CHLORIDE 0.9 % IV SOLN: INTRAVENOUS | Status: DC

## 2015-03-01 MED ORDER — METHOCARBAMOL 500 MG PO TABS
500.0000 mg | ORAL_TABLET | Freq: Four times a day (QID) | ORAL | Status: DC | PRN
  Administered 2015-03-02 – 2015-03-03 (×3): 500 mg via ORAL
  Filled 2015-03-01 (×3): qty 1

## 2015-03-01 MED ORDER — BISACODYL 10 MG RE SUPP: 10.0000 mg | Freq: Every day | RECTAL | Status: DC | PRN

## 2015-03-01 MED ORDER — HYDROMORPHONE HCL 1 MG/ML IJ SOLN: 0.2500 mg | INTRAMUSCULAR | Status: DC | PRN

## 2015-03-01 MED ORDER — ONDANSETRON HCL 4 MG/2ML IJ SOLN
4.0000 mg | Freq: Four times a day (QID) | INTRAMUSCULAR | Status: DC | PRN
  Administered 2015-03-02: 4 mg via INTRAVENOUS
  Filled 2015-03-01: qty 2

## 2015-03-01 MED ORDER — FLEET ENEMA 7-19 GM/118ML RE ENEM: 1.0000 | ENEMA | Freq: Once | RECTAL | Status: AC | PRN

## 2015-03-01 MED ORDER — METHOCARBAMOL 1000 MG/10ML IJ SOLN
500.0000 mg | Freq: Four times a day (QID) | INTRAVENOUS | Status: DC | PRN
  Administered 2015-03-01 – 2015-03-02 (×2): 500 mg via INTRAVENOUS
  Filled 2015-03-01 (×5): qty 5

## 2015-03-01 MED ORDER — PROPOFOL 10 MG/ML IV BOLUS
INTRAVENOUS | Status: AC
  Filled 2015-03-01: qty 20

## 2015-03-01 MED ORDER — BUPIVACAINE IN DEXTROSE 0.75-8.25 % IT SOLN
INTRATHECAL | Status: DC | PRN
  Administered 2015-03-01: 1.8 mL via INTRATHECAL

## 2015-03-01 MED ORDER — PHENOL 1.4 % MT LIQD: 1.0000 | OROMUCOSAL | Status: DC | PRN

## 2015-03-01 MED ORDER — LINAGLIPTIN 5 MG PO TABS
5.0000 mg | ORAL_TABLET | Freq: Every day | ORAL | Status: DC
  Administered 2015-03-02 – 2015-03-03 (×2): 5 mg via ORAL
  Filled 2015-03-01 (×2): qty 1

## 2015-03-01 MED ORDER — ONDANSETRON HCL 4 MG/2ML IJ SOLN
INTRAMUSCULAR | Status: AC
  Filled 2015-03-01: qty 2

## 2015-03-01 MED ORDER — VANCOMYCIN HCL IN DEXTROSE 1-5 GM/200ML-% IV SOLN
1000.0000 mg | INTRAVENOUS | Status: AC
  Administered 2015-03-01: 1000 mg via INTRAVENOUS

## 2015-03-01 MED ORDER — BUPIVACAINE HCL (PF) 0.25 % IJ SOLN
INTRAMUSCULAR | Status: AC
  Filled 2015-03-01: qty 30

## 2015-03-01 MED ORDER — VANCOMYCIN HCL IN DEXTROSE 1-5 GM/200ML-% IV SOLN
1000.0000 mg | Freq: Two times a day (BID) | INTRAVENOUS | Status: AC
  Administered 2015-03-02: 1000 mg via INTRAVENOUS
  Filled 2015-03-01: qty 200

## 2015-03-01 MED ORDER — ONDANSETRON HCL 4 MG/2ML IJ SOLN
INTRAMUSCULAR | Status: DC | PRN
  Administered 2015-03-01: 4 mg via INTRAVENOUS

## 2015-03-01 MED ORDER — SODIUM CHLORIDE 0.9 % IJ SOLN
INTRAMUSCULAR | Status: AC
  Filled 2015-03-01: qty 50

## 2015-03-01 MED ORDER — ACETAMINOPHEN 325 MG PO TABS: 650.0000 mg | ORAL_TABLET | Freq: Four times a day (QID) | ORAL | Status: DC | PRN

## 2015-03-01 MED ORDER — FENTANYL CITRATE 0.05 MG/ML IJ SOLN
INTRAMUSCULAR | Status: DC | PRN
  Administered 2015-03-01 (×2): 50 ug via INTRAVENOUS

## 2015-03-01 MED ORDER — ACETAMINOPHEN 650 MG RE SUPP: 650.0000 mg | Freq: Four times a day (QID) | RECTAL | Status: DC | PRN

## 2015-03-01 MED ORDER — PROPOFOL 10 MG/ML IV BOLUS
INTRAVENOUS | Status: DC | PRN
  Administered 2015-03-01: 20 mg via INTRAVENOUS

## 2015-03-01 MED ORDER — LORATADINE 10 MG PO TABS
10.0000 mg | ORAL_TABLET | Freq: Every day | ORAL | Status: DC
  Filled 2015-03-01 (×3): qty 1

## 2015-03-01 MED ORDER — DEXAMETHASONE SODIUM PHOSPHATE 10 MG/ML IJ SOLN
10.0000 mg | Freq: Once | INTRAMUSCULAR | Status: AC
  Administered 2015-03-01: 10 mg via INTRAVENOUS

## 2015-03-01 MED ORDER — SODIUM CHLORIDE 0.9 % IJ SOLN
INTRAMUSCULAR | Status: AC
  Filled 2015-03-01: qty 10

## 2015-03-01 MED ORDER — DIPHENHYDRAMINE HCL 12.5 MG/5ML PO ELIX
12.5000 mg | ORAL_SOLUTION | ORAL | Status: DC | PRN
Start: 2015-03-01 — End: 2015-03-03

## 2015-03-01 MED ORDER — SODIUM CHLORIDE 0.9 % IJ SOLN
INTRAMUSCULAR | Status: DC | PRN
  Administered 2015-03-01: 30 mL

## 2015-03-01 MED ORDER — HYDROMORPHONE HCL 1 MG/ML IJ SOLN
0.5000 mg | INTRAMUSCULAR | Status: DC | PRN
  Administered 2015-03-01: 1 mg via INTRAVENOUS
  Filled 2015-03-01: qty 1

## 2015-03-01 MED ORDER — SODIUM CHLORIDE 0.9 % IV SOLN
INTRAVENOUS | Status: DC
  Administered 2015-03-01 – 2015-03-03 (×2): via INTRAVENOUS

## 2015-03-01 MED ORDER — ACETAMINOPHEN 500 MG PO TABS
1000.0000 mg | ORAL_TABLET | Freq: Four times a day (QID) | ORAL | Status: DC
  Administered 2015-03-01 – 2015-03-02 (×2): 1000 mg via ORAL
  Filled 2015-03-01 (×4): qty 2

## 2015-03-01 MED ORDER — PANTOPRAZOLE SODIUM 40 MG PO TBEC
40.0000 mg | DELAYED_RELEASE_TABLET | Freq: Every day | ORAL | Status: DC
  Administered 2015-03-02 – 2015-03-03 (×2): 40 mg via ORAL
  Filled 2015-03-01 (×2): qty 1

## 2015-03-01 MED ORDER — VANCOMYCIN HCL IN DEXTROSE 1-5 GM/200ML-% IV SOLN
INTRAVENOUS | Status: AC
  Filled 2015-03-01: qty 200

## 2015-03-01 MED ORDER — ACETAMINOPHEN 10 MG/ML IV SOLN
1000.0000 mg | Freq: Once | INTRAVENOUS | Status: AC
  Administered 2015-03-01: 1000 mg via INTRAVENOUS
  Filled 2015-03-01: qty 100

## 2015-03-01 MED ORDER — LACTATED RINGERS IV SOLN
INTRAVENOUS | Status: AC
  Administered 2015-03-01 (×2): via INTRAVENOUS

## 2015-03-01 MED ORDER — CHLORHEXIDINE GLUCONATE 4 % EX LIQD: 60.0000 mL | Freq: Once | CUTANEOUS | Status: DC

## 2015-03-01 MED ORDER — ATORVASTATIN CALCIUM 10 MG PO TABS
10.0000 mg | ORAL_TABLET | Freq: Every morning | ORAL | Status: DC
  Administered 2015-03-03: 10 mg via ORAL
  Filled 2015-03-01 (×2): qty 1

## 2015-03-01 MED ORDER — HYDROMORPHONE HCL 2 MG PO TABS
2.0000 mg | ORAL_TABLET | ORAL | Status: DC | PRN
  Administered 2015-03-01 – 2015-03-02 (×3): 4 mg via ORAL
  Filled 2015-03-01 (×3): qty 2

## 2015-03-01 MED ORDER — FENTANYL CITRATE 0.05 MG/ML IJ SOLN
INTRAMUSCULAR | Status: AC
  Filled 2015-03-01: qty 2

## 2015-03-01 MED ORDER — BUPIVACAINE LIPOSOME 1.3 % IJ SUSP
20.0000 mL | Freq: Once | INTRAMUSCULAR | Status: DC
  Filled 2015-03-01: qty 20

## 2015-03-01 MED ORDER — RIVAROXABAN 10 MG PO TABS
10.0000 mg | ORAL_TABLET | Freq: Every day | ORAL | Status: DC
  Administered 2015-03-02 – 2015-03-03 (×2): 10 mg via ORAL
  Filled 2015-03-01 (×3): qty 1

## 2015-03-01 MED ORDER — FLUTICASONE PROPIONATE 50 MCG/ACT NA SUSP: 2.0000 | NASAL | Status: DC | PRN

## 2015-03-01 MED ORDER — POLYETHYLENE GLYCOL 3350 17 G PO PACK: 17.0000 g | PACK | Freq: Every day | ORAL | Status: DC | PRN

## 2015-03-01 MED ORDER — HYDROCHLOROTHIAZIDE 25 MG PO TABS
25.0000 mg | ORAL_TABLET | Freq: Every morning | ORAL | Status: DC
  Administered 2015-03-02 – 2015-03-03 (×2): 25 mg via ORAL
  Filled 2015-03-01 (×2): qty 1

## 2015-03-01 MED ORDER — METOCLOPRAMIDE HCL 5 MG PO TABS
5.0000 mg | ORAL_TABLET | Freq: Three times a day (TID) | ORAL | Status: DC | PRN
Start: 2015-03-01 — End: 2015-03-03
  Filled 2015-03-01: qty 2

## 2015-03-01 MED ORDER — DOCUSATE SODIUM 100 MG PO CAPS
100.0000 mg | ORAL_CAPSULE | Freq: Two times a day (BID) | ORAL | Status: DC
  Administered 2015-03-01 – 2015-03-03 (×4): 100 mg via ORAL

## 2015-03-01 MED ORDER — MENTHOL 3 MG MT LOZG: 1.0000 | LOZENGE | OROMUCOSAL | Status: DC | PRN

## 2015-03-01 MED ORDER — EPHEDRINE SULFATE 50 MG/ML IJ SOLN
INTRAMUSCULAR | Status: AC
  Filled 2015-03-01: qty 1

## 2015-03-01 MED ORDER — MIDAZOLAM HCL 2 MG/2ML IJ SOLN
INTRAMUSCULAR | Status: AC
Start: 2015-03-01 — End: 2015-03-01
  Filled 2015-03-01: qty 2

## 2015-03-01 MED ORDER — INSULIN ASPART 100 UNIT/ML ~~LOC~~ SOLN
0.0000 [IU] | Freq: Three times a day (TID) | SUBCUTANEOUS | Status: DC
  Administered 2015-03-02: 2 [IU] via SUBCUTANEOUS

## 2015-03-01 MED ORDER — PROPOFOL INFUSION 10 MG/ML OPTIME
INTRAVENOUS | Status: DC | PRN
  Administered 2015-03-01: 100 ug/kg/min via INTRAVENOUS

## 2015-03-01 MED ORDER — ONDANSETRON HCL 4 MG PO TABS: 4.0000 mg | ORAL_TABLET | Freq: Four times a day (QID) | ORAL | Status: DC | PRN

## 2015-03-01 MED ORDER — BUPIVACAINE HCL 0.25 % IJ SOLN
INTRAMUSCULAR | Status: DC | PRN
  Administered 2015-03-01: 30 mL

## 2015-03-01 MED ORDER — LIDOCAINE HCL (CARDIAC) 20 MG/ML IV SOLN
INTRAVENOUS | Status: AC
  Filled 2015-03-01: qty 5

## 2015-03-01 SURGICAL SUPPLY — 66 items
BAG DECANTER FOR FLEXI CONT (MISCELLANEOUS) ×2 IMPLANT
BAG SPEC THK2 15X12 ZIP CLS (MISCELLANEOUS) ×1
BAG ZIPLOCK 12X15 (MISCELLANEOUS) ×2 IMPLANT
BANDAGE ELASTIC 6 VELCRO ST LF (GAUZE/BANDAGES/DRESSINGS) ×2 IMPLANT
BANDAGE ESMARK 6X9 LF (GAUZE/BANDAGES/DRESSINGS) ×1 IMPLANT
BLADE SAG 18X100X1.27 (BLADE) ×2 IMPLANT
BLADE SAW SGTL 11.0X1.19X90.0M (BLADE) ×2 IMPLANT
BNDG CMPR 9X6 STRL LF SNTH (GAUZE/BANDAGES/DRESSINGS) ×1
BNDG ESMARK 6X9 LF (GAUZE/BANDAGES/DRESSINGS) ×2
BOWL SMART MIX CTS (DISPOSABLE) ×2 IMPLANT
CAP KNEE TOTAL 3 SIGMA ×1 IMPLANT
CEMENT HV SMART SET (Cement) ×4 IMPLANT
CUFF TOURN SGL QUICK 34 (TOURNIQUET CUFF) ×2
CUFF TRNQT CYL 34X4X40X1 (TOURNIQUET CUFF) ×1 IMPLANT
DECANTER SPIKE VIAL GLASS SM (MISCELLANEOUS) ×2 IMPLANT
DRAPE EXTREMITY T 121X128X90 (DRAPE) ×2 IMPLANT
DRAPE POUCH INSTRU U-SHP 10X18 (DRAPES) ×2 IMPLANT
DRAPE U-SHAPE 47X51 STRL (DRAPES) ×2 IMPLANT
DRSG ADAPTIC 3X8 NADH LF (GAUZE/BANDAGES/DRESSINGS) ×2 IMPLANT
DRSG PAD ABDOMINAL 8X10 ST (GAUZE/BANDAGES/DRESSINGS) ×2 IMPLANT
DURAPREP 26ML APPLICATOR (WOUND CARE) ×2 IMPLANT
ELECT REM PT RETURN 9FT ADLT (ELECTROSURGICAL) ×2
ELECTRODE REM PT RTRN 9FT ADLT (ELECTROSURGICAL) ×1 IMPLANT
EVACUATOR 1/8 PVC DRAIN (DRAIN) ×2 IMPLANT
FACESHIELD WRAPAROUND (MASK) ×10 IMPLANT
FACESHIELD WRAPAROUND OR TEAM (MASK) ×5 IMPLANT
GAUZE SPONGE 4X4 12PLY STRL (GAUZE/BANDAGES/DRESSINGS) ×2 IMPLANT
GLOVE BIO SURGEON STRL SZ7.5 (GLOVE) IMPLANT
GLOVE BIO SURGEON STRL SZ8 (GLOVE) ×2 IMPLANT
GLOVE BIOGEL PI IND STRL 6.5 (GLOVE) IMPLANT
GLOVE BIOGEL PI IND STRL 8 (GLOVE) ×1 IMPLANT
GLOVE BIOGEL PI INDICATOR 6.5 (GLOVE)
GLOVE BIOGEL PI INDICATOR 8 (GLOVE) ×2
GLOVE ECLIPSE 8.0 STRL XLNG CF (GLOVE) ×1 IMPLANT
GLOVE SURG SS PI 6.5 STRL IVOR (GLOVE) IMPLANT
GOWN STRL REUS W/TWL LRG LVL3 (GOWN DISPOSABLE) ×2 IMPLANT
GOWN STRL REUS W/TWL XL LVL3 (GOWN DISPOSABLE) IMPLANT
HANDPIECE INTERPULSE COAX TIP (DISPOSABLE) ×2
IMMOBILIZER KNEE 20 (SOFTGOODS) ×2
IMMOBILIZER KNEE 20 THIGH 36 (SOFTGOODS) ×1 IMPLANT
KIT BASIN OR (CUSTOM PROCEDURE TRAY) ×2 IMPLANT
MANIFOLD NEPTUNE II (INSTRUMENTS) ×2 IMPLANT
NDL SAFETY ECLIPSE 18X1.5 (NEEDLE) ×2 IMPLANT
NEEDLE HYPO 18GX1.5 SHARP (NEEDLE) ×4
NS IRRIG 1000ML POUR BTL (IV SOLUTION) ×2 IMPLANT
PACK TOTAL JOINT (CUSTOM PROCEDURE TRAY) ×2 IMPLANT
PADDING CAST COTTON 6X4 STRL (CAST SUPPLIES) ×4 IMPLANT
PEN SKIN MARKING BROAD (MISCELLANEOUS) ×2 IMPLANT
POSITIONER SURGICAL ARM (MISCELLANEOUS) ×2 IMPLANT
SET HNDPC FAN SPRY TIP SCT (DISPOSABLE) ×1 IMPLANT
SPLINT PLASTER EXTRA FAST 3X15 (CAST SUPPLIES) ×1
SPLINT PLASTER GYPS XFAST 3X15 (CAST SUPPLIES) IMPLANT
STRIP CLOSURE SKIN 1/2X4 (GAUZE/BANDAGES/DRESSINGS) ×3 IMPLANT
SUCTION FRAZIER 12FR DISP (SUCTIONS) ×2 IMPLANT
SUT MNCRL AB 4-0 PS2 18 (SUTURE) ×2 IMPLANT
SUT VIC AB 2-0 CT1 27 (SUTURE) ×6
SUT VIC AB 2-0 CT1 TAPERPNT 27 (SUTURE) ×3 IMPLANT
SUT VLOC 180 0 24IN GS25 (SUTURE) ×2 IMPLANT
SYR 20CC LL (SYRINGE) ×2 IMPLANT
SYR 50ML LL SCALE MARK (SYRINGE) ×2 IMPLANT
TOWEL OR 17X26 10 PK STRL BLUE (TOWEL DISPOSABLE) ×2 IMPLANT
TOWEL OR NON WOVEN STRL DISP B (DISPOSABLE) ×1 IMPLANT
TRAY FOLEY CATH 14FRSI W/METER (CATHETERS) ×2 IMPLANT
WATER STERILE IRR 1500ML POUR (IV SOLUTION) ×2 IMPLANT
WRAP KNEE MAXI GEL POST OP (GAUZE/BANDAGES/DRESSINGS) ×2 IMPLANT
YANKAUER SUCT BULB TIP 10FT TU (MISCELLANEOUS) ×2 IMPLANT

## 2015-03-01 NOTE — Transfer of Care (Signed)
Immediate Anesthesia Transfer of Care Note  Patient: Holly Fields  Procedure(s) Performed: Procedure(s) (LRB): LEFT TOTAL KNEE ARTHROPLASTY (Left)  Patient Location: PACU  Anesthesia Type: Spinal  Level of Consciousness: sedated, patient cooperative and responds to stimulation  Airway & Oxygen Therapy: Patient Spontanous Breathing and Patient connected to face mask oxgen  Post-op Assessment: Report given to PACU RN and Post -op Vital signs reviewed and stable  Post vital signs: Reviewed and stable  Complications: No apparent anesthesia complications

## 2015-03-01 NOTE — Plan of Care (Signed)
Problem: Consults Goal: Diagnosis- Total Joint Replacement Outcome: Completed/Met Date Met:  03/01/15 Left total knee

## 2015-03-01 NOTE — Anesthesia Postprocedure Evaluation (Signed)
  Anesthesia Post-op Note  Patient: Holly Fields  Procedure(s) Performed: Procedure(s) (LRB): LEFT TOTAL KNEE ARTHROPLASTY (Left)  Patient Location: PACU  Anesthesia Type: Spinal  Level of Consciousness: awake and alert   Airway and Oxygen Therapy: Patient Spontanous Breathing  Post-op Pain: mild  Post-op Assessment: Post-op Vital signs reviewed, Patient's Cardiovascular Status Stable, Respiratory Function Stable, Patent Airway and No signs of Nausea or vomiting  Last Vitals:  Filed Vitals:   03/01/15 1735  BP: 124/63  Pulse: 82  Temp: 36.7 C  Resp: 16    Post-op Vital Signs: stable   Complications: No apparent anesthesia complications

## 2015-03-01 NOTE — Anesthesia Procedure Notes (Signed)
Spinal Patient location during procedure: OR Start time: 03/01/2015 2:24 PM End time: 03/01/2015 2:27 PM Staffing Anesthesiologist: Franne Grip Resident/CRNA: Lajuana Carry E Performed by: resident/CRNA  Preanesthetic Checklist Completed: patient identified, site marked, surgical consent, pre-op evaluation, timeout performed, IV checked, risks and benefits discussed and monitors and equipment checked Spinal Block Patient position: sitting Prep: Betadine Patient monitoring: heart rate, continuous pulse ox and blood pressure Approach: midline Location: L4-5 Injection technique: single-shot Needle Needle type: Sprotte  Needle gauge: 24 G Needle length: 10 cm Assessment Sensory level: T6 Additional Notes Expiration date of kit checked and confirmed. Patient tolerated procedure well, without complications.

## 2015-03-01 NOTE — Op Note (Signed)
Pre-operative diagnosis- Osteoarthritis  Left knee(s)  Post-operative diagnosis- Osteoarthritis Left knee(s)  Procedure-  Left  Total Knee Arthroplasty  Surgeon- Gus RankinFrank V. Masaye Gatchalian, MD  Assistant- Leilani AbleSteve Chabon, PA-C   Anesthesia-  Spinal  EBL-* No blood loss amount entered *   Drains Hemovac  Tourniquet time-  Total Tourniquet Time Documented: Calf (Left) - 32 minutes Total: Calf (Left) - 32 minutes     Complications- None  Condition-PACU - hemodynamically stable.   Brief Clinical Note  Holly Fields is a 74 y.o. year old female with end stage OA of her left knee with progressively worsening pain and dysfunction. She has constant pain, with activity and at rest and significant functional deficits with difficulties even with ADLs. She has had extensive non-op management including analgesics, injections of cortisone and viscosupplements, and home exercise program, but remains in significant pain with significant dysfunction. Radiographs show bone on bone arthritis medial and patellofemoral. She presents now for left Total Knee Arthroplasty.    Procedure in detail---   The patient is brought into the operating room and positioned supine on the operating table. After successful administration of  Spinal,   a tourniquet is placed high on the  Left thigh(s) and the lower extremity is prepped and draped in the usual sterile fashion. Time out is performed by the operating team and then the  Left lower extremity is wrapped in Esmarch, knee flexed and the tourniquet inflated to 300 mmHg.       A midline incision is made with a ten blade through the subcutaneous tissue to the level of the extensor mechanism. A fresh blade is used to make a medial parapatellar arthrotomy. Soft tissue over the proximal medial tibia is subperiosteally elevated to the joint line with a knife and into the semimembranosus bursa with a Cobb elevator. Soft tissue over the proximal lateral tibia is elevated with attention  being paid to avoiding the patellar tendon on the tibial tubercle. The patella is everted, knee flexed 90 degrees and the ACL and PCL are removed. Findings are bone on bone arthritis medial and patellofemoral with large global osteophytes..       The drill is used to create a starting hole in the distal femur and the canal is thoroughly irrigated with sterile saline to remove the fatty contents. The 5 degree Left  valgus alignment guide is placed into the femoral canal and the distal femoral cutting block is pinned to remove 10 mm off the distal femur. Resection is made with an oscillating saw.      The tibia is subluxed forward and the menisci are removed. The extramedullary alignment guide is placed referencing proximally at the medial aspect of the tibial tubercle and distally along the second metatarsal axis and tibial crest. The block is pinned to remove 2mm off the more deficient medial  side. Resection is made with an oscillating saw. Size 2.5 is the most appropriate size for the tibia and the proximal tibia is prepared with the modular drill and keel punch for that size.      The femoral sizing guide is placed and size 3 is most appropriate. Rotation is marked off the epicondylar axis and confirmed by creating a rectangular flexion gap at 90 degrees. The size 3 cutting block is pinned in this rotation and the anterior, posterior and chamfer cuts are made with the oscillating saw. The intercondylar block is then placed and that cut is made.      Trial size 2.5 tibial  component, trial size 3 posterior stabilized femur and a 12.5  mm posterior stabilized rotating platform insert trial is placed. Full extension is achieved with excellent varus/valgus and anterior/posterior balance throughout full range of motion. The patella is everted and thickness measured to be 22  mm. Free hand resection is taken to 12 mm, a 35 template is placed, lug holes are drilled, trial patella is placed, and it tracks normally.  Osteophytes are removed off the posterior femur with the trial in place. All trials are removed and the cut bone surfaces prepared with pulsatile lavage. Cement is mixed and once ready for implantation, the size 2.5 tibial implant, size  3 posterior stabilized femoral component, and the size 35 patella are cemented in place and the patella is held with the clamp. The trial insert is placed and the knee held in full extension. The Exparel (20 ml mixed with 30 ml saline) and .25% Bupivicaine, are injected into the extensor mechanism, posterior capsule, medial and lateral gutters and subcutaneous tissues.  All extruded cement is removed and once the cement is hard the permanent 12.5 mm posterior stabilized rotating platform insert is placed into the tibial tray.      The wound is copiously irrigated with saline solution and the extensor mechanism closed over a hemovac drain with #1 V-loc suture. The tourniquet is released for a total tourniquet time of 32  minutes. Flexion against gravity is 135 degrees and the patella tracks normally. Subcutaneous tissue is closed with 2.0 vicryl and subcuticular with running 4.0 Monocryl. The incision is cleaned and dried and steri-strips and a bulky sterile dressing are applied. The limb is placed into a knee immobilizer and the patient is awakened and transported to recovery in stable condition.      Please note that a surgical assistant was a medical necessity for this procedure in order to perform it in a safe and expeditious manner. Surgical assistant was necessary to retract the ligaments and vital neurovascular structures to prevent injury to them and also necessary for proper positioning of the limb to allow for anatomic placement of the prosthesis.   Gus Rankin Rush Salce, MD    03/01/2015, 3:37 PM

## 2015-03-01 NOTE — Interval H&P Note (Signed)
History and Physical Interval Note:  03/01/2015 1:45 PM  [name]  has presented today for surgery, with the diagnosis of left knee ostoearthritis  The various methods of treatment have been discussed with the patient and family. After consideration of risks, benefits and other options for treatment, the patient has consented to  Procedure(s): LEFT TOTAL KNEE ARTHROPLASTY (Left) as a surgical intervention .  The patient's history has been reviewed, patient examined, no change in status, stable for surgery.  I have reviewed the patient's chart and labs.  Questions were answered to the patient's satisfaction.     Loanne DrillingALUISIO,Joaquina Nissen V

## 2015-03-01 NOTE — Anesthesia Preprocedure Evaluation (Addendum)
Anesthesia Evaluation  Patient identified by MRN, date of birth, ID band Patient awake    Reviewed: Allergy & Precautions, NPO status , Patient's Chart, lab work & pertinent test results  History of Anesthesia Complications (+) PONV and history of anesthetic complications  Airway Mallampati: II  TM Distance: >3 FB Neck ROM: Full    Dental no notable dental hx.    Pulmonary former smoker,  breath sounds clear to auscultation  Pulmonary exam normal       Cardiovascular hypertension, Pt. on medications Rhythm:Regular Rate:Normal     Neuro/Psych negative neurological ROS  negative psych ROS   GI/Hepatic Neg liver ROS, GERD-  Medicated,  Endo/Other  diabetes, Type 2, Oral Hypoglycemic Agents  Renal/GU Renal disease  negative genitourinary   Musculoskeletal  (+) Arthritis -,   Abdominal   Peds negative pediatric ROS (+)  Hematology negative hematology ROS (+)   Anesthesia Other Findings   Reproductive/Obstetrics negative OB ROS                           Anesthesia Physical Anesthesia Plan  ASA: II  Anesthesia Plan: Spinal   Post-op Pain Management:    Induction: Intravenous  Airway Management Planned:   Additional Equipment:   Intra-op Plan:   Post-operative Plan: Extubation in OR  Informed Consent: I have reviewed the patients History and Physical, chart, labs and discussed the procedure including the risks, benefits and alternatives for the proposed anesthesia with the patient or authorized representative who has indicated his/her understanding and acceptance.   Dental advisory given  Plan Discussed with: CRNA  Anesthesia Plan Comments: (Discussed spinal and general. Discussed risks and benefits of and differences between spinal and general. Discussed risks of spinal including headache, backache, failure, bleeding, infection, and nerve damage. Patient consents to spinal.  Questions answered. Coagulation studies and platelet count acceptable.)       Anesthesia Quick Evaluation

## 2015-03-01 NOTE — Progress Notes (Signed)
03/01/15 Nursing 2[address] PA called reg pts co of pain despite prescribed meds. Order received for tordol x 3 doses

## 2015-03-01 NOTE — H&P (View-Only) (Signed)
Holly Fields. Holly Fields DOB: 02/27/1941 Married / Language: English / Race: Black or African American Female Date of Admission:  03/01/2015 CC:  Left Knee Pain History of Present Illness The patient is a 74 year old female who comes in for a preoperative History and Physical. The patient is scheduled for a left total knee arthroplasty to be performed by Dr. Gus Rankin. Aluisio, MD at [facility] on 03-01-2015. The patient is a 74 year old female who presents for follow up of their knee. The patient is being followed for their left knee pain and osteoarthritis. They are a couple months out from her most recent cortisone injection. Symptoms reported include: pain. The patient has reported improvement of their symptoms with: Cortisone injections. It lasted a couple of months. She had an appointment with Dr. Katrinka Blazing on Feb 2nd for her pre-operative clearance. She had been seen and told she could proceed with surgery. She has a history of problems with anesthesia, but the hospital researched the history and she had two Anesthesiologists that got the right combination with her back surgery in 2014. They assured her that it would be in her record and the same anesthetics could be used for the total knee. The knee has gotten has gotten progressively worse. She is at the stage where she feels he is ready to undergo the total knee arthroplasty. The knee is hurting at all times. It is limiting what she can and cannot do. She is ready to porceed now with the knee replacement. They have been treated conservatively in the past for the above stated problem and despite conservative measures, they continue to have progressive pain and severe functional limitations and dysfunction. They have failed non-operative management including home exercise, medications, and injections. It is felt that they would benefit from undergoing total joint replacement. Risks and benefits of the procedure have been discussed with the patient and  they elect to proceed with surgery. There are no active contraindications to surgery such as ongoing infection or rapidly progressive neurological disease.  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 Augmentin *PENICILLINS* Hives. TraMADol HCl *ANALGESICS - OPIOID* Nausea. sickness Adenosine *ANTIARRHYTHMICS* OxyCODONE HCl *ANALGESICS - OPIOID* Nausea. sickness Codeine Phosphate *ANALGESICS - OPIOID* she is able to take Vicodin in small quanities  Family History Heart disease in female family member before age 76 Heart Disease mother and father Diabetes Mellitus grandmother mothers side and grandfather fathers side Osteoarthritis father Hypertension First Degree Relatives. mother and father Father Heart Disease Mother Living.  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 40 years ago, former smoker; smoke(d) less than 1/2 pack(s) per day; uses less than half 1/2 can(s) smokeless per week Advance Directives Living Will, Healthcare POA  Medication History  Januvia (  Tablet, Oral) Active. (qd) Krill Oil Omega-3 (  Capsule, Oral) Active. (qd) Atorvastatin Calcium (  Tablet, Oral) Active. (qd) Ibuprofen (Oral) Specific dose unknown - Active. ((prn for knees) prn) Lisinopril (  Tablet, Oral) Active. (qd) Loratadine (  Tablet, Oral) Active.  (prn allergies) Hydrochlorothiazide (  Tablet,  Oral) Active. (qd) Vitamin D3 (2000UNIT Capsule, Oral) Active. (qd) Aspirin EC (81MG  Tablet DR, Oral) Active. (qd) Pantoprazole Sodium (40MG  Tablet DR, Oral) Active.  Past Surgical History Sinus Surgery Straighten Nasal Septum Breast Biopsy left Total Knee Replacement right Arthroscopy of Knee left  Past Medical History Diabetes Mellitus, Type II Lymphedema-lower extremeties Unilateral primary osteoarthritis, left knee (M17.12) Vertigo Hypertension Gastroesophageal Reflux Disease Osteopenia  Review of Systems General Not Present- Chills, Fatigue, Fever, Memory Loss, Night Sweats, Weight Gain and Weight Loss. Skin Not Present- Eczema, Hives, Itching, Lesions and Rash. HEENT Not Present- Dentures, Double Vision, Headache, Hearing Loss, Tinnitus and Visual Loss. Respiratory Not Present- Allergies, Chronic Cough, Coughing up blood, Shortness of breath at rest and Shortness of breath with exertion. Cardiovascular Not Present- Chest Pain, Difficulty Breathing Lying Down, Murmur, Palpitations, Racing/skipping heartbeats and Swelling. Gastrointestinal Present- Heartburn. Not Present- Abdominal Pain, Bloody Stool, Constipation, Diarrhea, Difficulty Swallowing, Jaundice, Loss of appetitie, Nausea and Vomiting. Female Genitourinary Present- Urinating at Night. Not Present- Blood in Urine, Discharge, Flank Pain, Incontinence, Painful Urination, Urgency, Urinary frequency, Urinary Retention and Weak urinary stream. Musculoskeletal Present- Joint Pain, Morning Stiffness, Muscle Pain and Muscle Weakness. Not Present- Back Pain, Joint Swelling and Spasms. Neurological Not Present- Blackout spells, Difficulty with balance, Dizziness, Paralysis, Tremor and Weakness. Psychiatric Not Present- Insomnia.  Vitals  Weight: 184 lb Height: 62in Height was reported by patient. Body Surface Area: 1.84 m Body Mass Index: 33.65 kg/m   BP: 118/64 (Sitting, Right Arm, Standard)  Physical Exam General Mental Status -Alert, cooperative and good historian. General Appearance-pleasant, Not in acute distress. Orientation-Oriented X3. Build & Nutrition-Well nourished and Well developed.  Head and Neck Head-normocephalic, atraumatic . Neck Global Assessment - supple, no bruit auscultated on the right, no bruit auscultated on the left.  Eye Vision-Wears corrective lenses. Pupil - Bilateral-Regular and Round. Motion - Bilateral-EOMI.  Chest and Lung Exam Auscultation Breath sounds - clear at anterior chest wall and clear at posterior chest wall. Adventitious sounds - No Adventitious sounds.  Cardiovascular Auscultation Rhythm - Regular rate and rhythm. Heart Sounds - S1 WNL and S2 WNL. Murmurs & Other Heart Sounds - Auscultation of the heart reveals - No Murmurs.  Abdomen Palpation/Percussion Tenderness - Abdomen is non-tender to palpation. Rigidity (guarding) - Abdomen is soft. Auscultation Auscultation of the abdomen reveals - Bowel sounds normal.  Female Genitourinary Note: Not done, not pertinent to present illness   Musculoskeletal Note: On exam, she is alert and oriented and in no apparent distress. Her left knee shows no effusion. Her range of motion is about 5 to 125. There is marked crepitus on range of motion and tender medial greater than lateral with no instability.  RADIOGRAPHS: We reviewed her radiographs again. She is bone on bone in the medial and patellofemoral compartments with varus deformity.  Assessment & Plan  Primary osteoarthritis of left knee (M17.12) Note:Surgical Plans: Left Total Knee Repalcement  Disposition: Home  PCP: Dr. Juluis RainierElizabeth Barnes Cardiology: Dr. Verdis PrimeHenry Smith - Patient has been seen preoperatively and felt to be stable for surgery.  IV TXA  Anesthesia Issues: Postop Nausea and "slow to wake up" after previous knee scope  procedure.  Signed electronically by Lauraine RinneAlexzandrew L Woodrow Drab, III PA-C

## 2015-03-02 ENCOUNTER — Encounter (HOSPITAL_COMMUNITY): Payer: Self-pay | Admitting: Orthopedic Surgery

## 2015-03-02 LAB — CBC
HCT: 36.5 % (ref 36.0–46.0)
HEMOGLOBIN: 11.7 g/dL — AB (ref 12.0–15.0)
MCH: 26.7 pg (ref 26.0–34.0)
MCHC: 32.1 g/dL (ref 30.0–36.0)
MCV: 83.1 fL (ref 78.0–100.0)
Platelets: 172 10*3/uL (ref 150–400)
RBC: 4.39 MIL/uL (ref 3.87–5.11)
RDW: 13.1 % (ref 11.5–15.5)
WBC: 14.9 10*3/uL — ABNORMAL HIGH (ref 4.0–10.5)

## 2015-03-02 LAB — GLUCOSE, CAPILLARY
GLUCOSE-CAPILLARY: 133 mg/dL — AB (ref 70–99)
GLUCOSE-CAPILLARY: 104 mg/dL — AB (ref 70–99)
GLUCOSE-CAPILLARY: 102 mg/dL — AB (ref 70–99)
Glucose-Capillary: 128 mg/dL — ABNORMAL HIGH (ref 70–99)

## 2015-03-02 LAB — BASIC METABOLIC PANEL
Anion gap: 10 (ref 5–15)
BUN: 22 mg/dL (ref 6–23)
CO2: 24 mmol/L (ref 19–32)
Calcium: 8.6 mg/dL (ref 8.4–10.5)
Chloride: 103 mmol/L (ref 96–112)
Creatinine, Ser: 1.41 mg/dL — ABNORMAL HIGH (ref 0.50–1.10)
GFR calc non Af Amer: 37 mL/min — ABNORMAL LOW (ref >90)
GFR, EST AFRICAN AMERICAN: 42 mL/min — AB (ref >90)
Glucose, Bld: 188 mg/dL — ABNORMAL HIGH (ref 70–99)
POTASSIUM: 4.1 mmol/L (ref 3.5–5.1)
Sodium: 137 mmol/L (ref 135–145)

## 2015-03-02 LAB — ABO/RH: ABO/RH(D): B POS

## 2015-03-02 MED ORDER — HYDROCODONE-ACETAMINOPHEN 7.5-325 MG PO TABS
1.0000 | ORAL_TABLET | Freq: Four times a day (QID) | ORAL | Status: DC | PRN
  Administered 2015-03-02: 1 via ORAL
  Administered 2015-03-02 – 2015-03-03 (×3): 2 via ORAL
  Filled 2015-03-02: qty 1
  Filled 2015-03-02 (×3): qty 2

## 2015-03-02 MED ORDER — HYDROCODONE-ACETAMINOPHEN 7.5-325 MG PO TABS: 1.0000 | ORAL_TABLET | Freq: Four times a day (QID) | ORAL | Status: DC | PRN

## 2015-03-02 MED ORDER — RIVAROXABAN 10 MG PO TABS: 10.0000 mg | ORAL_TABLET | Freq: Every day | ORAL | Status: DC

## 2015-03-02 MED ORDER — DOCUSATE SODIUM 100 MG PO CAPS: 100.0000 mg | ORAL_CAPSULE | Freq: Two times a day (BID) | ORAL | Status: DC

## 2015-03-02 MED ORDER — ALUM & MAG HYDROXIDE-SIMETH 200-200-20 MG/5ML PO SUSP
30.0000 mL | ORAL | Status: DC | PRN
  Filled 2015-03-02: qty 30

## 2015-03-02 MED ORDER — METHOCARBAMOL 500 MG PO TABS: 500.0000 mg | ORAL_TABLET | Freq: Four times a day (QID) | ORAL | Status: DC | PRN

## 2015-03-02 NOTE — Evaluation (Signed)
Physical Therapy Evaluation Patient Details Name: Holly Fields MRN: [id number] DOB: 08/31/1941 Today's Date: 03/02/2015   History of Present Illness  Pt admitted for L TKA  Clinical Impression  Patient  Worked through nausea and pain and ambulated x 80'. Plans DC to home. Patient will benefit from PT to address problems listed in note below.    Follow Up Recommendations Home health PT;Supervision/Assistance - 24 hour    Equipment Recommendations  None recommended by PT    Recommendations for Other Services       Precautions / Restrictions Precautions Precautions: Fall;Knee Required Braces or Orthoses: Knee Immobilizer - Left Knee Immobilizer - Left: Discontinue once straight leg raise with < 10 degree lag Restrictions Weight Bearing Restrictions: No Other Position/Activity Restrictions: WBAT      Mobility  Bed Mobility Overal bed mobility: Needs Assistance Bed Mobility: Sit to Supine     Supine to sit: Min assist Sit to supine: Min assist   General bed mobility comments: support LLE onto bed, cues for technique  Transfers Overall transfer level: Needs assistance Equipment used: Rolling walker (2 wheeled) Transfers: Sit to/from Stand Sit to Stand: Min assist         General transfer comment: cues for hand placement and LE management.  Ambulation/Gait Ambulation/Gait assistance: Min assist Ambulation Distance (Feet): 70 Feet Assistive device: Rolling walker (2 wheeled) Gait Pattern/deviations: Step-to pattern;Antalgic;Decreased step length - left;Decreased stance time - left     General Gait Details: cues for sequence and  posture.  Stairs            Wheelchair Mobility    Modified Rankin (Stroke Patients Only)       Balance                                             Pertinent Vitals/Pain Pain Score: 5  Pain Location: L knee Pain Descriptors / Indicators: Aching;Tightness Pain Intervention(s): Limited activity within  patient's tolerance;Monitored during session;Premedicated before session;Ice applied    Home Living Family/patient expects to be discharged to:: Private residence Living Arrangements: Spouse/significant other   Type of Home: House Home Access: Stairs to enter Entrance Stairs-Rails: Right;Left;Can reach both Secretary/administratorntrance Stairs-Number of Steps: 3 Home Layout: Able to live on main level with bedroom/bathroom Home Equipment: Walker - 2 wheels;Bedside commode;Cane - single point      Prior Function Level of Independence: Independent;Independent with assistive device(s)         Comments: uses cane when she goes out of house but independent inside house.     Hand Dominance        Extremity/Trunk Assessment   Upper Extremity Assessment: Overall WFL for tasks assessed           Lower Extremity Assessment: LLE deficits/detail   LLE Deficits / Details: knee flexion=40, needs assistt fro SLR.     Communication   Communication: No difficulties  Cognition Arousal/Alertness: Awake/alert Behavior During Therapy: WFL for tasks assessed/performed Overall Cognitive Status: Within Functional Limits for tasks assessed                      General Comments      Exercises Total Joint Exercises Ankle Circles/Pumps: AROM;10 reps;Supine Quad Sets: AROM;Both;10 reps;Supine Towel Squeeze: AROM;Both;10 reps;Supine Short Arc Quad: AAROM;Left;10 reps;Supine Heel Slides: AAROM;Left;10 reps;Supine Hip ABduction/ADduction: AAROM;Left;10 reps;Supine Straight Leg Raises: AAROM;Left;10 reps;Supine  Assessment/Plan    PT Assessment Patient needs continued PT services  PT Diagnosis Difficulty walking   PT Problem List Decreased strength;Decreased range of motion;Decreased activity tolerance;Decreased mobility;Decreased safety awareness;Decreased knowledge of use of DME;Pain  PT Treatment Interventions DME instruction;Gait training;Functional mobility training;Therapeutic  activities;Therapeutic exercise;Patient/family education   PT Goals (Current goals can be found in the Care Plan section) Acute Rehab PT Goals Patient Stated Goal: feel better, ride my bike PT Goal Formulation: With patient Time For Goal Achievement: 03/06/15 Potential to Achieve Goals: Good    Frequency 7X/week   Barriers to discharge        Co-evaluation               End of Session Equipment Utilized During Treatment: Left knee immobilizer Activity Tolerance: Patient tolerated treatment well Patient left: in bed;with call bell/phone within reach;with family/visitor present Nurse Communication: Mobility status (pt felt catheter leak)         Time: 1313-1350 PT Time Calculation (min) (ACUTE ONLY): 37 min   Charges:   PT Evaluation $Initial PT Evaluation Tier I: 1 Procedure PT Treatments $Gait Training: 8-22 mins   PT G CodesRada Hay 03/02/2015, 4:20 PM Blanchard Kelch PT [phone]

## 2015-03-02 NOTE — Discharge Instructions (Addendum)
° °Dr. Frank Aluisio °Total Joint Specialist °[facility] °[address] °[place], [place] [address] °[phone] ° °TOTAL KNEE REPLACEMENT POSTOPERATIVE DIRECTIONS ° ° ° °Knee Rehabilitation, Guidelines Following Surgery  °Results after knee surgery are often greatly improved when you follow the exercise, range of motion and muscle strengthening exercises prescribed by your doctor. Safety measures are also important to protect the knee from further injury. Any time any of these exercises cause you to have increased pain or swelling in your knee joint, decrease the amount until you are comfortable again and slowly increase them. If you have problems or questions, call your caregiver or physical therapist for advice.  ° °HOME CARE INSTRUCTIONS  °Remove items at home which could result in a fall. This includes throw rugs or furniture in walking pathways.  °Continue medications as instructed at time of discharge. °You may have some home medications which will be placed on hold until you complete the course of blood thinner medication.  °You may start showering once you are discharged home but do not submerge the incision under water. Just pat the incision dry and apply a dry gauze dressing on daily. °Walk with walker as instructed.  °You may resume a sexual relationship in one month or when given the OK by  your doctor.  °· Use walker as long as suggested by your caregivers. °· Avoid periods of inactivity such as sitting longer than an hour when not asleep. This helps prevent blood clots.  °Weight bearing as tolerated with assist device (walker, cane, etc) as directed, use it as long as suggested by your surgeon or therapist, typically at least 4-6 weeks.  °You may return to work once you are cleared by your doctor.  °Do not drive a car for 6 weeks or until released by you surgeon.  °· Do not drive while taking narcotics.  °Wear the elastic stockings for three weeks following surgery during  the day but you may remove then at night. °Make sure you keep all of your appointments after your operation with all of your doctors and caregivers. You should call the office at the above phone number and make an appointment for approximately two weeks after the date of your surgery. °Change the dressing daily and reapply a dry dressing each time. °Please pick up a stool softener and laxative for home use as long as you are requiring pain medications. °· ICE to the affected knee every three hours for 30 minutes at a time and then as needed for pain and swelling.  Continue to use ice on the knee for pain and swelling from surgery. You may notice swelling that will progress down to the foot and ankle.  This is normal after surgery.  Elevate the leg when you are not up walking on it.   °It is important for you to complete the blood thinner medication as prescribed by your doctor. °· Continue to use the breathing machine which will help keep your temperature down.  It is common for your temperature to cycle up and down following surgery, especially at night when you are not up moving around and exerting yourself.  The breathing machine keeps your lungs expanded and your temperature down. ° °RANGE OF MOTION AND STRENGTHENING EXERCISES  °Rehabilitation of the knee is important following a knee injury or an operation. After just a few days of immobilization, the muscles of the thigh which control the knee become weakened and shrink (atrophy). Knee exercises are designed to build   up the tone and strength of the thigh muscles and to improve knee motion. Often times heat used for twenty to thirty minutes before working out will loosen up your tissues and help with improving the range of motion but do not use heat for the first two weeks following surgery. These exercises can be done on a training (exercise) mat, on the floor, on a table or on a bed. Use what ever works the best and is most comfortable for you Knee exercises  include:  °Leg Lifts - While your knee is still immobilized in a splint or cast, you can do straight leg raises. Lift the leg to 60 degrees, hold for 3 sec, and slowly lower the leg. Repeat 10-20 times 2-3 times daily. Perform this exercise against resistance later as your knee gets better.  °Quad and Hamstring Sets - Tighten up the muscle on the front of the thigh (Quad) and hold for 5-10 sec. Repeat this 10-20 times hourly. Hamstring sets are done by pushing the foot backward against an object and holding for 5-10 sec. Repeat as with quad sets.  °A rehabilitation program following serious knee injuries can speed recovery and prevent re-injury in the future due to weakened muscles. Contact your doctor or a physical therapist for more information on knee rehabilitation.  ° °SKILLED REHAB INSTRUCTIONS: °If the patient is transferred to a skilled rehab facility following release from the hospital, a list of the current medications will be sent to the facility for the patient to continue.  When discharged from the skilled rehab facility, please have the facility set up the patient's Home Health Physical Therapy prior to being released. Also, the skilled facility will be responsible for providing the patient with their medications at time of release from the facility to include their pain medication, the muscle relaxants, and their blood thinner medication. If the patient is still at the rehab facility at time of the two week follow up appointment, the skilled rehab facility will also need to assist the patient in arranging follow up appointment in our office and any transportation needs. ° °MAKE SURE YOU:  °Understand these instructions.  °Will watch your condition.  °Will get help right away if you are not doing well or get worse.  ° ° °Pick up stool softner and laxative for home use following surgery while on pain medications. °Do not submerge incision under water. °Please use good hand washing techniques while  changing dressing each day. °May shower starting three days after surgery. °Please use a clean towel to pat the incision dry following showers. °Continue to use ice for pain and swelling after surgery. °Do not use any lotions or creams on the incision until instructed by your surgeon. ° °INSTRUCTIONS AFTER JOINT REPLACEMENT  ° °Remove items at home which could result in a fall. This includes throw rugs or furniture in walking pathways °ICE to the affected joint every three hours while awake for 30 minutes at a time, for at least the first 3-5 days, and then as needed for pain and swelling.  Continue to use ice for pain and swelling. You may notice swelling that will progress down to the foot and ankle.  This is normal after surgery.  Elevate your leg when you are not up walking on it.   °Continue to use the breathing machine you got in the hospital (incentive spirometer) which will help keep your temperature down.  It is common for your temperature to cycle up and down following surgery,   especially at night when you are not up moving around and exerting yourself.  The breathing machine keeps your lungs expanded and your temperature down. ° ° °DIET:  As you were doing prior to hospitalization, we recommend a well-balanced diet. ° °DRESSING / WOUND CARE / SHOWERING ° °You may change your dressing after surgery.  Then change the dressing every day with sterile gauze.  Please use good hand washing techniques before changing the dressing.  Do not use any lotions or creams on the incision until instructed by your surgeon. and You may shower 3 days after surgery, but keep the wounds dry during showering.  You may use an occlusive plastic wrap (Press'n Seal for example), NO SOAKING/SUBMERGING IN THE BATHTUB.  If the bandage gets wet, change with a clean dry gauze.  If the incision gets wet, pat the wound dry with a clean towel. ° °ACTIVITY ° °Increase activity slowly as tolerated, but follow the weight bearing instructions  below.   °No driving for 6 weeks or until further direction given by your physician.  You cannot drive while taking narcotics.  °No lifting or carrying greater than 10 lbs. until further directed by your surgeon. °Avoid periods of inactivity such as sitting longer than an hour when not asleep. This helps prevent blood clots.  °You may return to work once you are authorized by your doctor.  ° ° ° °WEIGHT BEARING  ° °Weight bearing as tolerated with assist device (walker, cane, etc) as directed, use it as long as suggested by your surgeon or therapist, typically at least 4-6 weeks. ° ° °EXERCISES ° °Results after joint replacement surgery are often greatly improved when you follow the exercise, range of motion and muscle strengthening exercises prescribed by your doctor. Safety measures are also important to protect the joint from further injury. Any time any of these exercises cause you to have increased pain or swelling, decrease what you are doing until you are comfortable again and then slowly increase them. If you have problems or questions, call your caregiver or physical therapist for advice.  ° °Rehabilitation is important following a joint replacement. After just a few days of immobilization, the muscles of the leg can become weakened and shrink (atrophy).  These exercises are designed to build up the tone and strength of the thigh and leg muscles and to improve motion. Often times heat used for twenty to thirty minutes before working out will loosen up your tissues and help with improving the range of motion but do not use heat for the first two weeks following surgery (sometimes heat can increase post-operative swelling).  ° °These exercises can be done on a training (exercise) mat, on the floor, on a table or on a bed. Use whatever works the best and is most comfortable for you.    Use music or television while you are exercising so that the exercises are a pleasant break in your day. This will make your  life better with the exercises acting as a break in your routine that you can look forward to.   Perform all exercises about fifteen times, three times per day or as directed.  You should exercise both the operative leg and the other leg as well.  ° °Exercises include: °  °Quad Sets - Tighten up the muscle on the front of the thigh (Quad) and hold for 5-10 seconds.   °Straight Leg Raises - With your knee straight (if you were given a brace, keep it on), lift   the leg to 60 degrees, hold for 3 seconds, and slowly lower the leg.  Perform this exercise against resistance later as your leg gets stronger.  °Leg Slides: Lying on your back, slowly slide your foot toward your buttocks, bending your knee up off the floor (only go as far as is comfortable). Then slowly slide your foot back down until your leg is flat on the floor again.  °Angel Wings: Lying on your back spread your legs to the side as far apart as you can without causing discomfort.  °Hamstring Strength:  Lying on your back, push your heel against the floor with your leg straight by tightening up the muscles of your buttocks.  Repeat, but this time bend your knee to a comfortable angle, and push your heel against the floor.  You may put a pillow under the heel to make it more comfortable if necessary.  ° °A rehabilitation program following joint replacement surgery can speed recovery and prevent re-injury in the future due to weakened muscles. Contact your doctor or a physical therapist for more information on knee rehabilitation.  ° ° °CONSTIPATION ° °Constipation is defined medically as fewer than three stools per week and severe constipation as less than one stool per week.  Even if you have a regular bowel pattern at home, your normal regimen is likely to be disrupted due to multiple reasons following surgery.  Combination of anesthesia, postoperative narcotics, change in appetite and fluid intake all can affect your bowels.  ° °YOU MUST use at least one  of the following options; they are listed in order of increasing strength to get the job done.  They are all available over the counter, and you may need to use some, POSSIBLY even all of these options:   ° °Drink plenty of fluids (prune juice may be helpful) and high fiber foods °Colace 100 mg by mouth twice a day  °Senokot for constipation as directed and as needed Dulcolax (bisacodyl), take with full glass of water  °Miralax (polyethylene glycol) once or twice a day as needed. ° °If you have tried all these things and are unable to have a bowel movement in the first 3-4 days after surgery call either your surgeon or your primary doctor.   ° °If you experience loose stools or diarrhea, hold the medications until you stool forms back up.  If your symptoms do not get better within 1 week or if they get worse, check with your doctor.  If you experience "the worst abdominal pain ever" or develop nausea or vomiting, please contact the office immediately for further recommendations for treatment. ° ° °ITCHING:  If you experience itching with your medications, try taking only a single pain pill, or even half a pain pill at a time.  You can also use Benadryl over the counter for itching or also to help with sleep.  ° °TED HOSE STOCKINGS:  Use stockings on both legs until for at least 2 weeks or as directed by physician office. They may be removed at night for sleeping. ° °MEDICATIONS:  See your medication summary on the “After Visit Summary” that nursing will review with you.  You may have some home medications which will be placed on hold until you complete the course of blood thinner medication.  It is important for you to complete the blood thinner medication as prescribed. ° °PRECAUTIONS:  If you experience chest pain or shortness of breath - call 911 immediately for transfer to the hospital emergency   department.  ° °If you develop a fever greater that 101 F, purulent drainage from wound, increased redness or drainage  from wound, foul odor from the wound/dressing, or calf pain - CONTACT YOUR SURGEON.   °                                                °FOLLOW-UP APPOINTMENTS:  If you do not already have a post-op appointment, please call the office for an appointment to be seen by your surgeon.  Guidelines for how soon to be seen are listed in your “After Visit Summary”, but are typically between 1-4 weeks after surgery. ° ° ° °MAKE SURE YOU:  °Understand these instructions.  °Get help right away if you are not doing well or get worse.  ° ° °Thank you for letting us be a part of your medical care team.  It is a privilege we respect greatly.  We hope these instructions will help you stay on track for a fast and full recovery!  ° °Information on my medicine - XARELTO® (Rivaroxaban) ° °This medication education was reviewed with me or my healthcare representative as part of my discharge preparation.  The pharmacist that spoke with me during my hospital stay was:  Quina Wilbourne Marshall, RPH ° °Why was Xarelto® prescribed for you? °Xarelto® was prescribed for you to reduce the risk of blood clots forming after orthopedic surgery. The medical term for these abnormal blood clots is venous thromboembolism (VTE). ° °What do you need to know about xarelto® ? °Take your Xarelto® ONCE DAILY at the same time every day. °You may take it either with or without food. ° °If you have difficulty swallowing the tablet whole, you may crush it and mix in applesauce just prior to taking your dose. ° °Take Xarelto® exactly as prescribed by your doctor and DO NOT stop taking Xarelto® without talking to the doctor who prescribed the medication.  Stopping without other VTE prevention medication to take the place of Xarelto® may increase your risk of developing a clot. ° °After discharge, you should have regular check-up appointments with your healthcare provider that is prescribing your Xarelto®.   ° °What do you do if you miss a dose? °If you miss a dose,  take it as soon as you remember on the same day then continue your regularly scheduled once daily regimen the next day. Do not take two doses of Xarelto® on the same day.  ° °Important Safety Information °A possible side effect of Xarelto® is bleeding. You should call your healthcare provider right away if you experience any of the following: °? Bleeding from an injury or your nose that does not stop. °? Unusual colored urine (red or dark brown) or unusual colored stools (red or black). °? Unusual bruising for unknown reasons. °? A serious fall or if you hit your head (even if there is no bleeding). ° °Some medicines may interact with Xarelto® and might increase your risk of bleeding while on Xarelto®. To help avoid this, consult your healthcare provider or pharmacist prior to using any new prescription or non-prescription medications, including herbals, vitamins, non-steroidal anti-inflammatory drugs (NSAIDs) and supplements. ° °This website has more information on Xarelto®: [email or web address].com. ° ° ° °

## 2015-03-02 NOTE — Progress Notes (Signed)
   Subjective: 1 Day Post-Op Procedure(s) (LRB): LEFT TOTAL KNEE ARTHROPLASTY (Left) Patient reports pain as moderate.   Patient seen in rounds with Dr. Lequita HaltAluisio. Patient is well, and has had no acute complaints or problems other than some nausea related to pain medications. No issues overnight. No SOB or chest pain.  Plan is to go Home after hospital stay.  Objective: Vital signs in last 24 hours: Temp:  [97.4 F (36.3 C)-98.6 F (37 C)] 98.6 F (37 C) (03/29 0446) Pulse Rate:  [76-102] 91 (03/29 0446) Resp:  [12-19] 16 (03/29 0446) BP: (109-134)/(56-74) 118/69 mmHg (03/29 0446) SpO2:  [100 %] 100 % (03/29 0446) Weight:  [83.915 kg (185 lb)] 83.915 kg (185 lb) (03/28 1136)  Intake/Output from previous day:  Intake/Output Summary (Last 24 hours) at 03/02/15 0744 Last data filed at 03/02/15 0548  Gross per 24 hour  Intake   4200 ml  Output   1090 ml  Net   3110 ml     Labs:  Recent Labs  03/02/15 0445  HGB 11.7*    Recent Labs  03/02/15 0445  WBC 14.9*  RBC 4.39  HCT 36.5  PLT 172    Recent Labs  03/02/15 0445  NA 137  K 4.1  CL 103  CO2 24  BUN 22  CREATININE 1.41*  GLUCOSE 188*  CALCIUM 8.6    EXAM General - Patient is Alert and Oriented Extremity - Neurologically intact Intact pulses distally Dorsiflexion/Plantar flexion intact Compartment soft Dressing - dressing C/D/I Motor Function - intact, moving foot and toes well on exam.  Hemovac pulled without difficulty.  Past Medical History  Diagnosis Date  . Vitamin D deficiency   . Hypercholesteremia   . HTN (hypertension)   . Lymphedema of leg     right more than RIGHT  . Osteopenia   . Edema   . Diabetes   . CKD (chronic kidney disease)      saw dr Lowell Guitarpowell 2 years ago, now released from nephrology  . Vertigo     at times, cannot turn on left side quickly or sleep on left side  . Eczema     at times  . Narrowing of airway 2010  . Complication of anesthesia 2010    "sluggish after  anesthesia and had vertigo", slow to awaken after knee artroscopy 2012  . PONV (postoperative nausea and vomiting)     pt has n/v and vertigo after anesthesia  . Arthritis   . Numbness and tingling of right leg   . GERD (gastroesophageal reflux disease)     Assessment/Plan: 1 Day Post-Op Procedure(s) (LRB): LEFT TOTAL KNEE ARTHROPLASTY (Left) Principal Problem:   OA (osteoarthritis) of knee  Estimated body mass index is 33.83 kg/(m^2) as calculated from the following:   Height as of this encounter: 5\' 2"  (1.575 m).   Weight as of this encounter: 83.915 kg (185 lb). Advance diet Up with therapy D/C IV fluids when tolerating POs well  DVT Prophylaxis - Xarelto Weight-Bearing as tolerated  D/C O2 and Pulse OX and try on Room Air DC foley  Doing well for the most part. Will DC continuous Tylenol and Dilaudid and change to Norco since patient has tolerated that well previously. Continue PT today. Plan for DC home tomorrow.   Holly PedAmber Fidel Caggiano, PA-C Orthopaedic Surgery 03/02/2015, 7:44 AM

## 2015-03-02 NOTE — Evaluation (Signed)
Occupational Therapy Evaluation Patient Details Name: Holly Fields MRN: [id number] DOB: 08/08/1941 Today's Date: 03/02/2015    History of Present Illness Pt admitted for L TKA   Clinical Impression   Pt limited this session by nausea but does move well with min assist and walker. Will follow on acute to progress ADL independence for d/c home with spouse. BP sitting on BSC 137/67 and O2 99% RA.    Follow Up Recommendations  No OT follow up;Supervision/Assistance - 24 hour    Equipment Recommendations  None recommended by OT    Recommendations for Other Services       Precautions / Restrictions Precautions Precautions: Fall;Knee Required Braces or Orthoses: Knee Immobilizer - Left Knee Immobilizer - Left: Discontinue once straight leg raise with < 10 degree lag Restrictions Weight Bearing Restrictions: No Other Position/Activity Restrictions: WBAT      Mobility Bed Mobility Overal bed mobility: Needs Assistance Bed Mobility: Supine to Sit     Supine to sit: Min assist     General bed mobility comments: assist to support L LE off the bed. cues for technique to self assist.   Transfers Overall transfer level: Needs assistance Equipment used: Rolling walker (2 wheeled) Transfers: Sit to/from Stand Sit to Stand: Min assist         General transfer comment: cues for hand placement and LE management.    Balance                                            ADL Overall ADL's : Needs assistance/impaired Eating/Feeding: Independent;Sitting   Grooming: Wash/dry hands;Set up;Sitting   Upper Body Bathing: Set up;Sitting   Lower Body Bathing: Moderate assistance;Sit to/from stand   Upper Body Dressing : Set up;Sitting   Lower Body Dressing: Maximal assistance;Sit to/from stand   Toilet Transfer: Minimal assistance;Stand-pivot;BSC;RW Toilet Transfer Details (indicate cue type and reason): pivot bed to [facility] and to recliner. Toileting- Clothing  Manipulation and Hygiene: Min guard;Sitting/lateral lean         General ADL Comments: Pt reporting that she feels nauseous upon OT arrival. Notified nursing. Pt able to tolerate [facility] to [facility] and moves well with walker and then pivoted to recliner. Husband present. He states he can assist with LB dressing at home. She has DME from previous surgeries. Pt able to have small BM on BSC and felt some better. Nausea persisted during session.     Vision     Perception     Praxis      Pertinent Vitals/Pain Pain Assessment: 0-10 Pain Score: 5  Pain Location: L knee Pain Descriptors / Indicators: Aching Pain Intervention(s): Repositioned;Ice applied     Hand Dominance Left   Extremity/Trunk Assessment Upper Extremity Assessment Upper Extremity Assessment: Overall WFL for tasks assessed           Communication Communication Communication: No difficulties   Cognition Arousal/Alertness: Awake/alert Behavior During Therapy: WFL for tasks assessed/performed Overall Cognitive Status: Within Functional Limits for tasks assessed                     General Comments       Exercises       Shoulder Instructions      Home Living Family/patient expects to be discharged to:: Private residence Living Arrangements: Spouse/significant other Available Help at Discharge: Family Type of Home: House Home Access: Stairs  to enter Entrance Stairs-Number of Steps: 3 Entrance Stairs-Rails: Right;Left;Can reach both Home Layout: Able to live on main level with bedroom/bathroom     Bathroom Shower/Tub: Producer, television/film/video: Standard     Home Equipment: Environmental consultant - 2 wheels;Bedside commode;Cane - single point          Prior Functioning/Environment Level of Independence: Independent;Independent with assistive device(s)        Comments: uses cane when she goes out of house but independent inside house.    OT Diagnosis: Generalized weakness   OT Problem List:  Decreased strength;Decreased knowledge of use of DME or AE   OT Treatment/Interventions: Self-care/ADL training;Patient/family education;Therapeutic activities;DME and/or AE instruction    OT Goals(Current goals can be found in the care plan section) Acute Rehab OT Goals Patient Stated Goal: feel better OT Goal Formulation: With patient/family Time For Goal Achievement: 03/09/15 Potential to Achieve Goals: Good  OT Frequency: Min 2X/week   Barriers to D/C:            Co-evaluation              End of Session Equipment Utilized During Treatment: Gait belt;Left knee immobilizer  Activity Tolerance: Other (comment) (some nausea) Patient left: in chair;with call bell/phone within reach;with family/visitor present   Time: 1125-1205 OT Time Calculation (min): 40 min Charges:  OT General Charges $OT Visit: 1 Procedure OT Evaluation $Initial OT Evaluation Tier I: 1 Procedure OT Treatments $Self Care/Home Management : 8-22 mins $Therapeutic Activity: 8-22 mins G-Codes:    Lennox Laity  [phone] 03/02/2015, 12:31 PM

## 2015-03-02 NOTE — Progress Notes (Signed)
Utilization review completed.  

## 2015-03-02 NOTE — Care Management Note (Signed)
    Page 1 of 1   03/02/2015     3:54:50 PM CARE MANAGEMENT NOTE 03/02/2015  Patient:  Holly Fields, Holly Fields   Account Number:  [id number]  Date Initiated:  03/02/2015  Documentation initiated by:  [facility]  Subjective/Objective Assessment:   adm: LEFT TOTAL KNEE ARTHROPLASTY (Left)     Action/Plan:   discahrge planning   Anticipated DC Date:  03/03/2015   Anticipated DC Plan:  [place]  CM consult      [facility] Choice  HOME HEALTH   Choice offered to / List presented to:  C-1 Patient        [place] arranged  HH-2 PT      [place]   Status of service:  Completed, signed off Medicare Important Message given?   (If response is "NO", the following Medicare IM given date fields will be blank) Date Medicare IM given:   Medicare IM given by:   Date Additional Medicare IM given:   Additional Medicare IM given by:    Discharge Disposition:  [place]  Per UR Regulation:    If discussed at Long Length of Stay Meetings, dates discussed:    Comments:  03/02/15 15:50 CM met with pt in room to offer choice of home health agency. Pt chooses [facility] to render HHPT.  No DME needed as pt has both 3n1 and rolling walker.  Address and contact information verified by pt.  Referral emailed to [employer], Tim. No other Cm needs were communicated. Mariane Masters,  BSN, [place].

## 2015-03-03 LAB — BASIC METABOLIC PANEL
Anion gap: 10 (ref 5–15)
BUN: 18 mg/dL (ref 6–23)
CALCIUM: 8.7 mg/dL (ref 8.4–10.5)
CO2: 23 mmol/L (ref 19–32)
Chloride: 105 mmol/L (ref 96–112)
Creatinine, Ser: 1.39 mg/dL — ABNORMAL HIGH (ref 0.50–1.10)
GFR calc Af Amer: 43 mL/min — ABNORMAL LOW (ref >90)
GFR calc non Af Amer: 37 mL/min — ABNORMAL LOW (ref >90)
Glucose, Bld: 119 mg/dL — ABNORMAL HIGH (ref 70–99)
Potassium: 3.5 mmol/L (ref 3.5–5.1)
Sodium: 138 mmol/L (ref 135–145)

## 2015-03-03 LAB — CBC
HCT: 36.5 % (ref 36.0–46.0)
HEMOGLOBIN: 11.7 g/dL — AB (ref 12.0–15.0)
MCH: 26.9 pg (ref 26.0–34.0)
MCHC: 32.1 g/dL (ref 30.0–36.0)
MCV: 83.9 fL (ref 78.0–100.0)
Platelets: 163 10*3/uL (ref 150–400)
RBC: 4.35 MIL/uL (ref 3.87–5.11)
RDW: 13.4 % (ref 11.5–15.5)
WBC: 14.4 10*3/uL — ABNORMAL HIGH (ref 4.0–10.5)

## 2015-03-03 LAB — GLUCOSE, CAPILLARY
GLUCOSE-CAPILLARY: 124 mg/dL — AB (ref 70–99)
Glucose-Capillary: 107 mg/dL — ABNORMAL HIGH (ref 70–99)

## 2015-03-03 MED ORDER — ONDANSETRON HCL 4 MG PO TABS: 4.0000 mg | ORAL_TABLET | Freq: Four times a day (QID) | ORAL | Status: DC | PRN

## 2015-03-03 NOTE — Progress Notes (Signed)
   Subjective: 2 Days Post-Op Procedure(s) (LRB): LEFT TOTAL KNEE ARTHROPLASTY (Left) Patient reports pain as mild.   Patient seen in rounds with Dr. Lequita HaltAluisio. Patient is doing fair this morning. She had some issues yesterday with nausea that limited her. Feeling better today. No issues overnight other than being upset that she was not awoken for pain medication. Plan is to go Home after hospital stay.  Objective: Vital signs in last 24 hours: Temp:  [98 F (36.7 C)-98.3 F (36.8 C)] 98 F (36.7 C) (03/30 0623) Pulse Rate:  [78-115] 115 (03/30 0623) Resp:  [16] 16 (03/30 0623) BP: (100-137)/(58-84) 131/84 mmHg (03/30 0623) SpO2:  [99 %-100 %] 100 % (03/30 0623)  Intake/Output from previous day:  Intake/Output Summary (Last 24 hours) at 03/03/15 0645 Last data filed at 03/03/15 [id number]  Gross per 24 hour  Intake   2555 ml  Output   1425 ml  Net   1130 ml    Intake/Output this shift: Total I/O In: 2075 [I.V.:2075] Out: 850 [Urine:850]  Labs:  Recent Labs  03/02/15 0445  HGB 11.7*    Recent Labs  03/02/15 0445  WBC 14.9*  RBC 4.39  HCT 36.5  PLT 172    Recent Labs  03/02/15 0445  NA 137  K 4.1  CL 103  CO2 24  BUN 22  CREATININE 1.41*  GLUCOSE 188*  CALCIUM 8.6    EXAM General - Patient is Alert and Oriented Extremity - Neurologically intact Intact pulses distally Dorsiflexion/Plantar flexion intact Dressing/Incision - clean, dry, no drainage Motor Function - intact, moving foot and toes well on exam.   Past Medical History  Diagnosis Date  . Vitamin D deficiency   . Hypercholesteremia   . HTN (hypertension)   . Lymphedema of leg     right more than RIGHT  . Osteopenia   . Edema   . Diabetes   . CKD (chronic kidney disease)      saw dr Lowell Guitarpowell 2 years ago, now released from nephrology  . Vertigo     at times, cannot turn on left side quickly or sleep on left side  . Eczema     at times  . Narrowing of airway 2010  . Complication of  anesthesia 2010    "sluggish after anesthesia and had vertigo", slow to awaken after knee artroscopy 2012  . PONV (postoperative nausea and vomiting)     pt has n/v and vertigo after anesthesia  . Arthritis   . Numbness and tingling of right leg   . GERD (gastroesophageal reflux disease)     Assessment/Plan: 2 Days Post-Op Procedure(s) (LRB): LEFT TOTAL KNEE ARTHROPLASTY (Left) Principal Problem:   OA (osteoarthritis) of knee  Estimated body mass index is 33.83 kg/(m^2) as calculated from the following:   Height as of this encounter: 5\' 2"  (1.575 m).   Weight as of this encounter: 83.915 kg (185 lb). Advance diet Up with therapy Discharge home with home health  DVT Prophylaxis - Xarelto Weight-Bearing as tolerated  She is doing better this morning in regards to nausea. Will plan for DC home this afternoon after PT.  Dimitri PedAmber Jadon Ressler, PA-C Orthopaedic Surgery 03/03/2015, 6:45 AM

## 2015-03-03 NOTE — Progress Notes (Signed)
Occupational Therapy Treatment Patient Details Name: Holly Fields J Kirsten MRN: [id number] DOB: 11/05/1941 Today's Date: 03/03/2015    History of present illness Pt admitted for L TKA   OT comments  Pt limited by pain this am. She states she hurt during the night and is having pain this am 10/10 after returning from bathroom with nursing. Educated on shower transfer and safety including how to place 3in1 in shower for safety and walker use. Also demonstrated how spouse should assist with shower transfer. Pt declined need to get up to practice right now and stated her pain was too high to move right now. She verbalized understanding of shower transfer technique and states she doesn't feel she needs to actually practice it.     Follow Up Recommendations  No OT follow up;Supervision/Assistance - 24 hour    Equipment Recommendations  None recommended by OT    Recommendations for Other Services      Precautions / Restrictions Precautions Precautions: Fall;Knee Required Braces or Orthoses: Knee Immobilizer - Left Knee Immobilizer - Left: Discontinue once straight leg raise with < 10 degree lag Restrictions Weight Bearing Restrictions: No Other Position/Activity Restrictions: WBAT       Mobility Bed Mobility                  Transfers                      Balance                                   ADL                                         General ADL Comments: Pt states she just returned from the 3in1 with nursing and her pain is 10/10. She states she feels comfortable with 3in1 transfer and declines need to practice further. She was agreeable for OT to educate on shower transfer so demonstrated shower transfer technique and educated on options for placement of 3in1 in shower for safety if she can bring walker into shower and also if it doesnt fit. She declines needs to actually practice shower transfer and states demonstration makes sense  and husband can assist as needed. Placed ice pack on knee and encouraged rest.       Vision                     Perception     Praxis      Cognition   Behavior During Therapy: [facility]FL for tasks assessed/performed Overall Cognitive Status: Within Functional Limits for tasks assessed                       Extremity/Trunk Assessment               Exercises     Shoulder Instructions       General Comments      Pertinent Vitals/ Pain       Pain Assessment: 0-10 Pain Score: 10-Worst pain ever Pain Location: L knee Pain Descriptors / Indicators: Aching Pain Intervention(s): Repositioned;Ice applied  Home Living  Prior Functioning/Environment              Frequency Min 2X/week     Progress Toward Goals  OT Goals(current goals can now be found in the care plan section)  Progress towards OT goals: Not progressing toward goals - comment (limited by pain this visit.)     Plan Discharge plan remains appropriate    Co-evaluation                 End of Session CPM Left Knee CPM Left Knee: On   Activity Tolerance Patient limited by pain   Patient Left in chair;with call bell/phone within reach   Nurse Communication          Time: 1030-1043 OT Time Calculation (min): 13 min  Charges: OT General Charges $OT Visit: 1 Procedure OT Treatments $Therapeutic Activity: 8-22 mins  Lennox Laity  [phone] 03/03/2015, 12:20 PM

## 2015-03-03 NOTE — Progress Notes (Signed)
Patient upset this morning that she was not awaken last night for her pain medication. Apologized to patient for this oversight as I was not aware she wanted to be awaken for her pain medication.

## 2015-03-03 NOTE — Discharge Summary (Signed)
Physician Discharge Summary   Patient ID: ELENIE COVEN MRN: [id number] DOB/AGE: 1941/04/09 74 y.o.  Admit date: 03/01/2015 Discharge date: 03/03/2015  Primary Diagnosis: Primary osteoarthritis, left knee   Admission Diagnoses:  Past Medical History  Diagnosis Date  . Vitamin D deficiency   . Hypercholesteremia   . HTN (hypertension)   . Lymphedema of leg     right more than RIGHT  . Osteopenia   . Edema   . Diabetes   . CKD (chronic kidney disease)      saw dr Florene Glen 2 years ago, now released from nephrology  . Vertigo     at times, cannot turn on left side quickly or sleep on left side  . Eczema     at times  . Narrowing of airway 2010  . Complication of anesthesia 2010    "sluggish after anesthesia and had vertigo", slow to awaken after knee artroscopy 2012  . PONV (postoperative nausea and vomiting)     pt has n/v and vertigo after anesthesia  . Arthritis   . Numbness and tingling of right leg   . GERD (gastroesophageal reflux disease)    Discharge Diagnoses:   Principal Problem:   OA (osteoarthritis) of knee  Estimated body mass index is 33.83 kg/(m^2) as calculated from the following:   Height as of this encounter: _0  (1.575 m).   Weight as of this encounter: 83.915 kg (185 lb).  Procedure:  Procedure(s) (LRB): LEFT TOTAL KNEE ARTHROPLASTY (Left)   Consults: None  HPI: The patient is a 74 year old female who comes in for a preoperative History and Physical. The patient is scheduled for a left total knee arthroplasty to be performed by Dr. Dione Plover. Aluisio, MD at [facility] on 03-01-2015. The patient is a 74 year old female who presents for follow up of their knee. The patient is being followed for their left knee pain and osteoarthritis. They are a couple months out from her most recent cortisone injection. Symptoms reported include: pain. The patient has reported improvement of their symptoms with: Cortisone injections. It lasted a couple of months.  She had an appointment with Dr. Tamala Julian on Feb 2nd for her pre-operative clearance. She had been seen and told she could proceed with surgery. She has a history of problems with anesthesia, but the hospital researched the history and she had two Anesthesiologists that got the right combination with her back surgery in 2014. They assured her that it would be in her record and the same anesthetics could be used for the total knee. The knee has gotten has gotten progressively worse. She is at the stage where she feels he is ready to undergo the total knee arthroplasty. The knee is hurting at all times. It is limiting what she can and cannot do. She is ready to porceed now with the knee replacement. They have been treated conservatively in the past for the above stated problem and despite conservative measures, they continue to have progressive pain and severe functional limitations and dysfunction. They have failed non-operative management including home exercise, medications, and injections. It is felt that they would benefit from undergoing total joint replacement. Risks and benefits of the procedure have been discussed with the patient and they elect to proceed with surgery. There are no active contraindications to surgery such as ongoing infection or rapidly progressive neurological disease.  Laboratory Data: Admission on 03/01/2015, Discharged on 03/03/2015  Component Date Value Ref Range Status  . ABO/RH(D) 03/01/2015  B POS   Final  . Antibody Screen 03/01/2015 NEG   Final  . Sample Expiration 03/01/2015 03/04/2015   Final  . Glucose-Capillary 03/01/2015 101* 70 - 99 mg/dL Final  . Comment 1 03/01/2015 Notify RN   Final  . Glucose-Capillary 03/01/2015 69* 70 - 99 mg/dL Final  . Comment 1 03/01/2015 Notify RN   Final  . Comment 2 03/01/2015 Document in Chart   Final  . Glucose-Capillary 03/01/2015 69* 70 - 99 mg/dL Final  . Comment 1 03/01/2015 Notify RN   Final  . Comment 2 03/01/2015 Document  in Chart   Final  . WBC 03/02/2015 14.9* 4.0 - 10.5 K/uL Final  . RBC 03/02/2015 4.39  3.87 - 5.11 MIL/uL Final  . Hemoglobin 03/02/2015 11.7* 12.0 - 15.0 g/dL Final  . HCT 03/02/2015 36.5  36.0 - 46.0 % Final  . MCV 03/02/2015 83.1  78.0 - 100.0 fL Final  . MCH 03/02/2015 26.7  26.0 - 34.0 pg Final  . MCHC 03/02/2015 32.1  30.0 - 36.0 g/dL Final  . RDW 03/02/2015 13.1  11.5 - 15.5 % Final  . Platelets 03/02/2015 172  150 - 400 K/uL Final  . Sodium 03/02/2015 137  135 - 145 mmol/L Final  . Potassium 03/02/2015 4.1  3.5 - 5.1 mmol/L Final  . Chloride 03/02/2015 103  96 - 112 mmol/L Final  . CO2 03/02/2015 24  19 - 32 mmol/L Final  . Glucose, Bld 03/02/2015 188* 70 - 99 mg/dL Final  . BUN 03/02/2015 22  6 - 23 mg/dL Final  . Creatinine, Ser 03/02/2015 1.41* 0.50 - 1.10 mg/dL Final  . Calcium 03/02/2015 8.6  8.4 - 10.5 mg/dL Final  . GFR calc non Af Amer 03/02/2015 37* >90 mL/min Final  . GFR calc Af Amer 03/02/2015 42* >90 mL/min Final   Comment: (NOTE) The eGFR has been calculated using the CKD EPI equation. This calculation has not been validated in all clinical situations. eGFR's persistently <90 mL/min signify possible Chronic Kidney Disease.   . Anion gap 03/02/2015 10  5 - 15 Final  . Glucose-Capillary 03/01/2015 158* 70 - 99 mg/dL Final  . ABO/RH(D) 03/01/2015 B POS   Final  . Glucose-Capillary 03/02/2015 133* 70 - 99 mg/dL Final  . Glucose-Capillary 03/02/2015 104* 70 - 99 mg/dL Final  . Glucose-Capillary 03/02/2015 102* 70 - 99 mg/dL Final  . WBC 03/03/2015 14.4* 4.0 - 10.5 K/uL Final  . RBC 03/03/2015 4.35  3.87 - 5.11 MIL/uL Final  . Hemoglobin 03/03/2015 11.7* 12.0 - 15.0 g/dL Final  . HCT 03/03/2015 36.5  36.0 - 46.0 % Final  . MCV 03/03/2015 83.9  78.0 - 100.0 fL Final  . MCH 03/03/2015 26.9  26.0 - 34.0 pg Final  . MCHC 03/03/2015 32.1  30.0 - 36.0 g/dL Final  . RDW 03/03/2015 13.4  11.5 - 15.5 % Final  . Platelets 03/03/2015 163  150 - 400 K/uL Final  . Sodium  03/03/2015 138  135 - 145 mmol/L Final  . Potassium 03/03/2015 3.5  3.5 - 5.1 mmol/L Final  . Chloride 03/03/2015 105  96 - 112 mmol/L Final  . CO2 03/03/2015 23  19 - 32 mmol/L Final  . Glucose, Bld 03/03/2015 119* 70 - 99 mg/dL Final  . BUN 03/03/2015 18  6 - 23 mg/dL Final  . Creatinine, Ser 03/03/2015 1.39* 0.50 - 1.10 mg/dL Final  . Calcium 03/03/2015 8.7  8.4 - 10.5 mg/dL Final  . GFR calc non Af  Amer 03/03/2015 37* >90 mL/min Final  . GFR calc Af Amer 03/03/2015 43* >90 mL/min Final   Comment: (NOTE) The eGFR has been calculated using the CKD EPI equation. This calculation has not been validated in all clinical situations. eGFR's persistently <90 mL/min signify possible Chronic Kidney Disease.   . Anion gap 03/03/2015 10  5 - 15 Final  . Glucose-Capillary 03/02/2015 128* 70 - 99 mg/dL Final  . Comment 1 03/02/2015 Notify RN   Final  . Comment 2 03/02/2015 Document in Chart   Final  . Glucose-Capillary 03/03/2015 124* 70 - 99 mg/dL Final  . Comment 1 03/03/2015 Notify RN   Final  . Glucose-Capillary 03/03/2015 107* 70 - 99 mg/dL Final  . Comment 1 03/03/2015 Notify RN   Final  Hospital Outpatient Visit on 02/18/2015  Component Date Value Ref Range Status  . MRSA, PCR 02/17/2015 NEGATIVE  NEGATIVE Final  . Staphylococcus aureus 02/17/2015 NEGATIVE  NEGATIVE Final   Comment:        The Xpert SA Assay (FDA approved for NASAL specimens in patients over 63 years of age), is one component of a comprehensive surveillance program.  Test performance has been validated by [facility] for patients greater than or equal to 60 year old. It is not intended to diagnose infection nor to guide or monitor treatment.   [name] Kitchen aPTT 02/18/2015 35  24 - 37 seconds Final  . WBC 02/18/2015 9.4  4.0 - 10.5 K/uL Final  . RBC 02/18/2015 4.85  3.87 - 5.11 MIL/uL Final  . Hemoglobin 02/18/2015 13.0  12.0 - 15.0 g/dL Final  . HCT 02/18/2015 41.1  36.0 - 46.0 % Final  . MCV 02/18/2015 84.7  78.0 -  100.0 fL Final  . MCH 02/18/2015 26.8  26.0 - 34.0 pg Final  . MCHC 02/18/2015 31.6  30.0 - 36.0 g/dL Final  . RDW 02/18/2015 13.5  11.5 - 15.5 % Final  . Platelets 02/18/2015 215  150 - 400 K/uL Final  . Sodium 02/18/2015 139  135 - 145 mmol/L Final  . Potassium 02/18/2015 4.2  3.5 - 5.1 mmol/L Final  . Chloride 02/18/2015 102  96 - 112 mmol/L Final  . CO2 02/18/2015 27  19 - 32 mmol/L Final  . Glucose, Bld 02/18/2015 88  70 - 99 mg/dL Final  . BUN 02/18/2015 18  6 - 23 mg/dL Final  . Creatinine, Ser 02/18/2015 1.55* 0.50 - 1.10 mg/dL Final  . Calcium 02/18/2015 9.4  8.4 - 10.5 mg/dL Final  . Total Protein 02/18/2015 7.3  6.0 - 8.3 g/dL Final  . Albumin 02/18/2015 4.1  3.5 - 5.2 g/dL Final  . AST 02/18/2015 23  0 - 37 U/L Final  . ALT 02/18/2015 16  0 - 35 U/L Final  . Alkaline Phosphatase 02/18/2015 64  39 - 117 U/L Final  . Total Bilirubin 02/18/2015 0.9  0.3 - 1.2 mg/dL Final  . GFR calc non Af Amer 02/18/2015 33* >90 mL/min Final  . GFR calc Af Amer 02/18/2015 38* >90 mL/min Final   Comment: (NOTE) The eGFR has been calculated using the CKD EPI equation. This calculation has not been validated in all clinical situations. eGFR's persistently <90 mL/min signify possible Chronic Kidney Disease.   . Anion gap 02/18/2015 10  5 - 15 Final  . Prothrombin Time 02/18/2015 13.8  11.6 - 15.2 seconds Final  . INR 02/18/2015 1.05  0.00 - 1.49 Final  . Color, Urine 02/18/2015 YELLOW  YELLOW Final  .  APPearance 02/18/2015 CLEAR  CLEAR Final  . Specific Gravity, Urine 02/18/2015 1.009  1.005 - 1.030 Final  . pH 02/18/2015 7.0  5.0 - 8.0 Final  . Glucose, UA 02/18/2015 NEGATIVE  NEGATIVE mg/dL Final  . Hgb urine dipstick 02/18/2015 NEGATIVE  NEGATIVE Final  . Bilirubin Urine 02/18/2015 NEGATIVE  NEGATIVE Final  . Ketones, ur 02/18/2015 NEGATIVE  NEGATIVE mg/dL Final  . Protein, ur 02/18/2015 NEGATIVE  NEGATIVE mg/dL Final  . Urobilinogen, UA 02/18/2015 0.2  0.0 - 1.0 mg/dL Final  .  Nitrite 02/18/2015 NEGATIVE  NEGATIVE Final  . Leukocytes, UA 02/18/2015 TRACE* NEGATIVE Final  . Squamous Epithelial / LPF 02/18/2015 RARE  RARE Final  . WBC, UA 02/18/2015 0-2  <3 WBC/hpf Final  . RBC / HPF 02/18/2015 0-2  <3 RBC/hpf Final  . Bacteria, UA 02/18/2015 RARE  RARE Final     X-Rays:No results found.  EKG: Orders placed or performed in visit on 01/05/15  . EKG 12-Lead     Hospital Course: KARLEY PHO is a 74 y.o. who was admitted to [facility]. They were brought to the operating room on 03/01/2015 and underwent Procedure(s): LEFT TOTAL KNEE ARTHROPLASTY.  Patient tolerated the procedure well and was later transferred to the recovery room and then to the orthopaedic floor for postoperative care.  They were given PO and IV analgesics for pain control following their surgery.  They were given 24 hours of postoperative antibiotics of  Anti-infectives    Start     Dose/Rate Route Frequency Ordered Stop   03/02/15 0200  vancomycin (VANCOCIN) IVPB 1000 mg/200 mL premix     1,000 mg 200 mL/hr over 60 Minutes Intravenous Every 12 hours 03/01/15 1746 03/02/15 0316   03/01/15 1139  vancomycin (VANCOCIN) IVPB 1000 mg/200 mL premix     1,000 mg 200 mL/hr over 60 Minutes Intravenous On call to O.R. 03/01/15 1139 03/01/15 1500     and started on DVT prophylaxis in the form of Xarelto.   PT and OT were ordered for total joint protocol.  Discharge planning consulted to help with postop disposition and equipment needs.  Patient had a fair night on the evening of surgery.  They started to get up OOB with therapy on day one. Hemovac drain was pulled without difficulty. Was somewhat limited by nausea.  Continued to work with therapy into day two.  Dressing was changed on day two and the incision was clean and dry.  Patient was seen in rounds and was ready to go home.   Diet: Cardiac diet Activity:WBAT Follow-up:in 2 weeks Disposition - Home Discharged Condition:  stable   Discharge Instructions    Call MD / Call 911    Complete by:  As directed   If you experience chest pain or shortness of breath, CALL 911 and be transported to the hospital emergency room.  If you develope a fever above 101 F, pus (white drainage) or increased drainage or redness at the wound, or calf pain, call your surgeon's office.     Change dressing    Complete by:  As directed   Change dressing daily with sterile 4 x 4 inch gauze dressing and apply TED hose.     Constipation Prevention    Complete by:  As directed   Drink plenty of fluids.  Prune juice may be helpful.  You may use a stool softener, such as Colace (over the counter) 100 mg twice a day.  Use MiraLax (over the counter)  for constipation as needed.     Diet - low sodium heart healthy    Complete by:  As directed      Diet Carb Modified    Complete by:  As directed      Discharge instructions    Complete by:  As directed   INSTRUCTIONS AFTER JOINT REPLACEMENT   Remove items at home which could result in a fall. This includes throw rugs or furniture in walking pathways ICE to the affected joint every three hours while awake for 30 minutes at a time, for at least the first 3-5 days, and then as needed for pain and swelling.  Continue to use ice for pain and swelling. You may notice swelling that will progress down to the foot and ankle.  This is normal after surgery.  Elevate your leg when you are not up walking on it.   Continue to use the breathing machine you got in the hospital (incentive spirometer) which will help keep your temperature down.  It is common for your temperature to cycle up and down following surgery, especially at night when you are not up moving around and exerting yourself.  The breathing machine keeps your lungs expanded and your temperature down.   DIET:  As you were doing prior to hospitalization, we recommend a well-balanced diet.  DRESSING / WOUND CARE / SHOWERING  You may change your  dressing after surgery.  Then change the dressing every day with sterile gauze.  Please use good hand washing techniques before changing the dressing.  Do not use any lotions or creams on the incision until instructed by your surgeon. and You may shower 3 days after surgery, but keep the wounds dry during showering.  You may use an occlusive plastic wrap (Press'n Seal for example), NO SOAKING/SUBMERGING IN THE BATHTUB.  If the bandage gets wet, change with a clean dry gauze.  If the incision gets wet, pat the wound dry with a clean towel.  ACTIVITY  Increase activity slowly as tolerated, but follow the weight bearing instructions below.   No driving for 6 weeks or until further direction given by your physician.  You cannot drive while taking narcotics.  No lifting or carrying greater than 10 lbs. until further directed by your surgeon. Avoid periods of inactivity such as sitting longer than an hour when not asleep. This helps prevent blood clots.  You may return to work once you are authorized by your doctor.     WEIGHT BEARING   Weight bearing as tolerated with assist device (walker, cane, etc) as directed, use it as long as suggested by your surgeon or therapist, typically at least 4-6 weeks.   EXERCISES  Results after joint replacement surgery are often greatly improved when you follow the exercise, range of motion and muscle strengthening exercises prescribed by your doctor. Safety measures are also important to protect the joint from further injury. Any time any of these exercises cause you to have increased pain or swelling, decrease what you are doing until you are comfortable again and then slowly increase them. If you have problems or questions, call your caregiver or physical therapist for advice.   Rehabilitation is important following a joint replacement. After just a few days of immobilization, the muscles of the leg can become weakened and shrink (atrophy).  These exercises are  designed to build up the tone and strength of the thigh and leg muscles and to improve motion. Often times heat used for twenty to  thirty minutes before working out will loosen up your tissues and help with improving the range of motion but do not use heat for the first two weeks following surgery (sometimes heat can increase post-operative swelling).   These exercises can be done on a training (exercise) mat, on the floor, on a table or on a bed. Use whatever works the best and is most comfortable for you.    Use music or television while you are exercising so that the exercises are a pleasant break in your day. This will make your life better with the exercises acting as a break in your routine that you can look forward to.   Perform all exercises about fifteen times, three times per day or as directed.  You should exercise both the operative leg and the other leg as well.   Exercises include:   Quad Sets - Tighten up the muscle on the front of the thigh (Quad) and hold for 5-10 seconds.   Straight Leg Raises - With your knee straight (if you were given a brace, keep it on), lift the leg to 60 degrees, hold for 3 seconds, and slowly lower the leg.  Perform this exercise against resistance later as your leg gets stronger.  Leg Slides: Lying on your back, slowly slide your foot toward your buttocks, bending your knee up off the floor (only go as far as is comfortable). Then slowly slide your foot back down until your leg is flat on the floor again.  Angel Wings: Lying on your back spread your legs to the side as far apart as you can without causing discomfort.  Hamstring Strength:  Lying on your back, push your heel against the floor with your leg straight by tightening up the muscles of your buttocks.  Repeat, but this time bend your knee to a comfortable angle, and push your heel against the floor.  You may put a pillow under the heel to make it more comfortable if necessary.   A rehabilitation program  following joint replacement surgery can speed recovery and prevent re-injury in the future due to weakened muscles. Contact your doctor or a physical therapist for more information on knee rehabilitation.    CONSTIPATION  Constipation is defined medically as fewer than three stools per week and severe constipation as less than one stool per week.  Even if you have a regular bowel pattern at home, your normal regimen is likely to be disrupted due to multiple reasons following surgery.  Combination of anesthesia, postoperative narcotics, change in appetite and fluid intake all can affect your bowels.   YOU MUST use at least one of the following options; they are listed in order of increasing strength to get the job done.  They are all available over the counter, and you may need to use some, POSSIBLY even all of these options:    Drink plenty of fluids (prune juice may be helpful) and high fiber foods Colace 100 mg by mouth twice a day  Senokot for constipation as directed and as needed Dulcolax (bisacodyl), take with full glass of water  Miralax (polyethylene glycol) once or twice a day as needed.  If you have tried all these things and are unable to have a bowel movement in the first 3-4 days after surgery call either your surgeon or your primary doctor.    If you experience loose stools or diarrhea, hold the medications until you stool forms back up.  If your symptoms do not get better  within 1 week or if they get worse, check with your doctor.  If you experience "the worst abdominal pain ever" or develop nausea or vomiting, please contact the office immediately for further recommendations for treatment.   ITCHING:  If you experience itching with your medications, try taking only a single pain pill, or even half a pain pill at a time.  You can also use Benadryl over the counter for itching or also to help with sleep.   TED HOSE STOCKINGS:  Use stockings on both legs until for at least 2 weeks  or as directed by physician office. They may be removed at night for sleeping.  MEDICATIONS:  See your medication summary on the "After Visit Summary" that nursing will review with you.  You may have some home medications which will be placed on hold until you complete the course of blood thinner medication.  It is important for you to complete the blood thinner medication as prescribed.  PRECAUTIONS:  If you experience chest pain or shortness of breath - call 911 immediately for transfer to the hospital emergency department.   If you develop a fever greater that 101 F, purulent drainage from wound, increased redness or drainage from wound, foul odor from the wound/dressing, or calf pain - CONTACT YOUR SURGEON.                                                   FOLLOW-UP APPOINTMENTS:  If you do not already have a post-op appointment, please call the office for an appointment to be seen by your surgeon.  Guidelines for how soon to be seen are listed in your "After Visit Summary", but are typically between 1-4 weeks after surgery.    MAKE SURE YOU:  Understand these instructions.  Get help right away if you are not doing well or get worse.    Thank you for letting us be a part of your medical care team.  It is a privilege we respect greatly.  We hope these instructions will help you stay on track for a fast and full recovery!     Increase activity slowly as tolerated    Complete by:  As directed      TED hose    Complete by:  As directed   Use stockings (TED hose) for 3 weeks on both leg(s).  You may remove them at night for sleeping.            Medication List    STOP taking these medications        acetaminophen 500 MG tablet  Commonly known as:  TYLENOL     aspirin 81 MG tablet     KRILL OIL PO     naproxen sodium 220 MG tablet  Commonly known as:  ANAPROX     VITAMIN D3 SUPER STRENGTH 2000 UNITS Tabs  Generic drug:  Cholecalciferol      TAKE these medications         atorvastatin 10 MG tablet  Commonly known as:  LIPITOR  Take 10 mg by mouth every morning.     docusate sodium 100 MG capsule  Commonly known as:  COLACE  Take 1 capsule (100 mg total) by mouth 2 (two) times daily.     fexofenadine 180 MG tablet  Commonly known as:  ALLEGRA  Take 90  mg by mouth as needed for allergies. Takes 1/2 tablet     fluticasone 50 MCG/ACT nasal spray  Commonly known as:  FLONASE  Place 2 sprays into the nose as needed for allergies.     hydrochlorothiazide 25 MG tablet  Commonly known as:  HYDRODIURIL  Take 25 mg by mouth every morning.     HYDROcodone-acetaminophen 7.5-325 MG per tablet  Commonly known as:  NORCO  Take 1-2 tablets by mouth every 6 (six) hours as needed for moderate pain.     JANUVIA 50 MG tablet  Generic drug:  sitaGLIPtin  Take 50 mg by mouth every morning.     lisinopril 5 MG tablet  Commonly known as:  PRINIVIL,ZESTRIL  Take 5 mg by mouth every morning.     methocarbamol 500 MG tablet  Commonly known as:  ROBAXIN  Take 1 tablet (500 mg total) by mouth every 6 (six) hours as needed for muscle spasms.     ondansetron 4 MG tablet  Commonly known as:  ZOFRAN  Take 1 tablet (4 mg total) by mouth every 6 (six) hours as needed for nausea.     pantoprazole 40 MG tablet  Commonly known as:  PROTONIX  Take 40 mg by mouth daily.     rivaroxaban 10 MG Tabs tablet  Commonly known as:  XARELTO  Take 1 tablet (10 mg total) by mouth daily with breakfast.     tetrahydrozoline 0.05 % ophthalmic solution  Place 1 drop into both eyes 2 (two) times daily as needed (Allergies).           Follow-up Information    Follow up with Gearlean Alf, MD. Schedule an appointment as soon as possible for a visit on 03/16/2015.   Specialty:  Orthopedic Surgery   Why:  Call [phone] tomorrow to make the appointment   Contact information:   [address] [place] [address] [phone]       Follow up with [facility].   Why:  home health physical therapy   Contact information:   [address] [place] [address] [phone]       Signed: Ardeen Jourdain, PA-C Orthopaedic Surgery 03/03/2015, 2:34 PM

## 2015-03-03 NOTE — Progress Notes (Signed)
Physical Therapy Treatment Patient Details Name: Holly Fields MRN: [id number] DOB: 11/30/1941 Today's Date: 03/03/2015    History of Present Illness Pt admitted for L TKA    PT Comments    Patient experiencing increased pain but calmed down with mobility. Patient is ready to DC to home.   Follow Up Recommendations  Home health PT;Supervision/Assistance - 24 hour     Equipment Recommendations  None recommended by PT    Recommendations for Other Services       Precautions / Restrictions Precautions Precautions: Fall;Knee Required Braces or Orthoses: Knee Immobilizer - Left Knee Immobilizer - Left: Discontinue once straight leg raise with < 10 degree lag Restrictions Weight Bearing Restrictions: No Other Position/Activity Restrictions: WBAT    Mobility  Bed Mobility                  Transfers   Equipment used: Rolling walker (2 wheeled) Transfers: Sit to/from Stand Sit to Stand: Min assist         General transfer comment: cues for hand placement and LE management.  Ambulation/Gait Ambulation/Gait assistance: Min assist Ambulation Distance (Feet): 30 Feet Assistive device: Rolling walker (2 wheeled) Gait Pattern/deviations: Step-to pattern;Antalgic;Decreased step length - left;Decreased stance time - left     General Gait Details: cues for sequence and  posture.   Stairs Stairs: Yes Stairs assistance: Mod assist Stair Management: One rail Right;Step to pattern;Forwards;With cane Number of Stairs: 2 General stair comments: spouse present to assist., cues for safety.  Wheelchair Mobility    Modified Rankin (Stroke Patients Only)       Balance                                    Cognition Arousal/Alertness: Awake/alert Behavior During Therapy: WFL for tasks assessed/performed Overall Cognitive Status: Within Functional Limits for tasks assessed                      Exercises Total Joint Exercises Ankle  Circles/Pumps: AROM;10 reps;Supine Quad Sets: AROM;Both;10 reps;Supine Towel Squeeze: AROM;Both;10 reps;Supine Short Arc Quad: AAROM;Left;10 reps;Supine Heel Slides: AAROM;Left;10 reps;Supine Hip ABduction/ADduction: AAROM;Left;10 reps;Supine Straight Leg Raises: AAROM;Left;10 reps;Supine Goniometric ROM: 10-45 L knee    General Comments        Pertinent Vitals/Pain Pain Score: 7  Pain Location: L knee, decreased after working with mobility Pain Descriptors / Indicators: Aching;Spasm;Constant Pain Intervention(s): Limited activity within patient's tolerance;Monitored during session;Premedicated before session;Ice applied    Home Living                      Prior Function            PT Goals (current goals can now be found in the care plan section) Progress towards PT goals: Progressing toward goals    Frequency  7X/week    PT Plan Current plan remains appropriate    Co-evaluation             End of Session Equipment Utilized During Treatment: Left knee immobilizer Activity Tolerance: Patient tolerated treatment well Patient left: in chair;with call bell/phone within reach;with family/visitor present     Time: 1235-1316 PT Time Calculation (min) (ACUTE ONLY): 41 min  Charges:  $Gait Training: 8-22 mins $Therapeutic Exercise: 8-22 mins $Self Care/Home Management: 8-22                    G  Codes:      Rada Hay 03/03/2015, 3:29 PM Blanchard Kelch PT [phone]

## 2015-03-04 DIAGNOSIS — M1990 Unspecified osteoarthritis, unspecified site: Secondary | ICD-10-CM | POA: Diagnosis not present

## 2015-03-04 DIAGNOSIS — E119 Type 2 diabetes mellitus without complications: Secondary | ICD-10-CM | POA: Diagnosis not present

## 2015-03-04 DIAGNOSIS — Z471 Aftercare following joint replacement surgery: Secondary | ICD-10-CM | POA: Diagnosis not present

## 2015-03-04 DIAGNOSIS — M4806 Spinal stenosis, lumbar region: Secondary | ICD-10-CM | POA: Diagnosis not present

## 2015-03-04 DIAGNOSIS — I129 Hypertensive chronic kidney disease with stage 1 through stage 4 chronic kidney disease, or unspecified chronic kidney disease: Secondary | ICD-10-CM | POA: Diagnosis not present

## 2015-03-04 DIAGNOSIS — N189 Chronic kidney disease, unspecified: Secondary | ICD-10-CM | POA: Diagnosis not present

## 2015-03-05 DIAGNOSIS — I129 Hypertensive chronic kidney disease with stage 1 through stage 4 chronic kidney disease, or unspecified chronic kidney disease: Secondary | ICD-10-CM | POA: Diagnosis not present

## 2015-03-05 DIAGNOSIS — M4806 Spinal stenosis, lumbar region: Secondary | ICD-10-CM | POA: Diagnosis not present

## 2015-03-05 DIAGNOSIS — E119 Type 2 diabetes mellitus without complications: Secondary | ICD-10-CM | POA: Diagnosis not present

## 2015-03-05 DIAGNOSIS — N189 Chronic kidney disease, unspecified: Secondary | ICD-10-CM | POA: Diagnosis not present

## 2015-03-05 DIAGNOSIS — M1990 Unspecified osteoarthritis, unspecified site: Secondary | ICD-10-CM | POA: Diagnosis not present

## 2015-03-05 DIAGNOSIS — Z471 Aftercare following joint replacement surgery: Secondary | ICD-10-CM | POA: Diagnosis not present

## 2015-03-08 DIAGNOSIS — Z471 Aftercare following joint replacement surgery: Secondary | ICD-10-CM | POA: Diagnosis not present

## 2015-03-08 DIAGNOSIS — E119 Type 2 diabetes mellitus without complications: Secondary | ICD-10-CM | POA: Diagnosis not present

## 2015-03-08 DIAGNOSIS — N189 Chronic kidney disease, unspecified: Secondary | ICD-10-CM | POA: Diagnosis not present

## 2015-03-08 DIAGNOSIS — I129 Hypertensive chronic kidney disease with stage 1 through stage 4 chronic kidney disease, or unspecified chronic kidney disease: Secondary | ICD-10-CM | POA: Diagnosis not present

## 2015-03-08 DIAGNOSIS — M4806 Spinal stenosis, lumbar region: Secondary | ICD-10-CM | POA: Diagnosis not present

## 2015-03-08 DIAGNOSIS — M1990 Unspecified osteoarthritis, unspecified site: Secondary | ICD-10-CM | POA: Diagnosis not present

## 2015-03-09 DIAGNOSIS — E119 Type 2 diabetes mellitus without complications: Secondary | ICD-10-CM | POA: Diagnosis not present

## 2015-03-09 DIAGNOSIS — M4806 Spinal stenosis, lumbar region: Secondary | ICD-10-CM | POA: Diagnosis not present

## 2015-03-09 DIAGNOSIS — Z471 Aftercare following joint replacement surgery: Secondary | ICD-10-CM | POA: Diagnosis not present

## 2015-03-09 DIAGNOSIS — N189 Chronic kidney disease, unspecified: Secondary | ICD-10-CM | POA: Diagnosis not present

## 2015-03-09 DIAGNOSIS — M1990 Unspecified osteoarthritis, unspecified site: Secondary | ICD-10-CM | POA: Diagnosis not present

## 2015-03-09 DIAGNOSIS — I129 Hypertensive chronic kidney disease with stage 1 through stage 4 chronic kidney disease, or unspecified chronic kidney disease: Secondary | ICD-10-CM | POA: Diagnosis not present

## 2015-03-11 DIAGNOSIS — M1990 Unspecified osteoarthritis, unspecified site: Secondary | ICD-10-CM | POA: Diagnosis not present

## 2015-03-11 DIAGNOSIS — I129 Hypertensive chronic kidney disease with stage 1 through stage 4 chronic kidney disease, or unspecified chronic kidney disease: Secondary | ICD-10-CM | POA: Diagnosis not present

## 2015-03-11 DIAGNOSIS — N189 Chronic kidney disease, unspecified: Secondary | ICD-10-CM | POA: Diagnosis not present

## 2015-03-11 DIAGNOSIS — M4806 Spinal stenosis, lumbar region: Secondary | ICD-10-CM | POA: Diagnosis not present

## 2015-03-11 DIAGNOSIS — Z471 Aftercare following joint replacement surgery: Secondary | ICD-10-CM | POA: Diagnosis not present

## 2015-03-11 DIAGNOSIS — E119 Type 2 diabetes mellitus without complications: Secondary | ICD-10-CM | POA: Diagnosis not present

## 2015-03-12 DIAGNOSIS — M1712 Unilateral primary osteoarthritis, left knee: Secondary | ICD-10-CM | POA: Diagnosis not present

## 2015-03-15 DIAGNOSIS — M1712 Unilateral primary osteoarthritis, left knee: Secondary | ICD-10-CM | POA: Diagnosis not present

## 2015-03-16 DIAGNOSIS — Z471 Aftercare following joint replacement surgery: Secondary | ICD-10-CM | POA: Diagnosis not present

## 2015-03-16 DIAGNOSIS — Z96652 Presence of left artificial knee joint: Secondary | ICD-10-CM | POA: Diagnosis not present

## 2015-03-18 DIAGNOSIS — M1712 Unilateral primary osteoarthritis, left knee: Secondary | ICD-10-CM | POA: Diagnosis not present

## 2015-03-23 DIAGNOSIS — M1712 Unilateral primary osteoarthritis, left knee: Secondary | ICD-10-CM | POA: Diagnosis not present

## 2015-03-25 DIAGNOSIS — M1712 Unilateral primary osteoarthritis, left knee: Secondary | ICD-10-CM | POA: Diagnosis not present

## 2015-03-30 DIAGNOSIS — M1712 Unilateral primary osteoarthritis, left knee: Secondary | ICD-10-CM | POA: Diagnosis not present

## 2015-04-01 DIAGNOSIS — M1712 Unilateral primary osteoarthritis, left knee: Secondary | ICD-10-CM | POA: Diagnosis not present

## 2015-04-06 DIAGNOSIS — Z471 Aftercare following joint replacement surgery: Secondary | ICD-10-CM | POA: Diagnosis not present

## 2015-04-06 DIAGNOSIS — M1712 Unilateral primary osteoarthritis, left knee: Secondary | ICD-10-CM | POA: Diagnosis not present

## 2015-04-06 DIAGNOSIS — Z96651 Presence of right artificial knee joint: Secondary | ICD-10-CM | POA: Diagnosis not present

## 2015-04-06 DIAGNOSIS — Z96652 Presence of left artificial knee joint: Secondary | ICD-10-CM | POA: Diagnosis not present

## 2015-04-08 DIAGNOSIS — M1712 Unilateral primary osteoarthritis, left knee: Secondary | ICD-10-CM | POA: Diagnosis not present

## 2015-04-12 DIAGNOSIS — M1712 Unilateral primary osteoarthritis, left knee: Secondary | ICD-10-CM | POA: Diagnosis not present

## 2015-04-15 DIAGNOSIS — M1712 Unilateral primary osteoarthritis, left knee: Secondary | ICD-10-CM | POA: Diagnosis not present

## 2015-04-20 DIAGNOSIS — M1712 Unilateral primary osteoarthritis, left knee: Secondary | ICD-10-CM | POA: Diagnosis not present

## 2015-04-22 DIAGNOSIS — M1712 Unilateral primary osteoarthritis, left knee: Secondary | ICD-10-CM | POA: Diagnosis not present

## 2015-04-26 DIAGNOSIS — Z96652 Presence of left artificial knee joint: Secondary | ICD-10-CM | POA: Diagnosis not present

## 2015-04-26 DIAGNOSIS — Z471 Aftercare following joint replacement surgery: Secondary | ICD-10-CM | POA: Diagnosis not present

## 2015-04-27 DIAGNOSIS — M1712 Unilateral primary osteoarthritis, left knee: Secondary | ICD-10-CM | POA: Diagnosis not present

## 2015-04-29 DIAGNOSIS — M1712 Unilateral primary osteoarthritis, left knee: Secondary | ICD-10-CM | POA: Diagnosis not present

## 2015-05-04 DIAGNOSIS — M1712 Unilateral primary osteoarthritis, left knee: Secondary | ICD-10-CM | POA: Diagnosis not present

## 2015-05-07 DIAGNOSIS — M1712 Unilateral primary osteoarthritis, left knee: Secondary | ICD-10-CM | POA: Diagnosis not present

## 2015-05-11 DIAGNOSIS — Z471 Aftercare following joint replacement surgery: Secondary | ICD-10-CM | POA: Diagnosis not present

## 2015-05-11 DIAGNOSIS — Z96652 Presence of left artificial knee joint: Secondary | ICD-10-CM | POA: Diagnosis not present

## 2015-05-20 DIAGNOSIS — M5136 Other intervertebral disc degeneration, lumbar region: Secondary | ICD-10-CM | POA: Diagnosis not present

## 2015-05-27 DIAGNOSIS — H5203 Hypermetropia, bilateral: Secondary | ICD-10-CM | POA: Diagnosis not present

## 2015-05-27 DIAGNOSIS — E119 Type 2 diabetes mellitus without complications: Secondary | ICD-10-CM | POA: Diagnosis not present

## 2015-05-27 DIAGNOSIS — H524 Presbyopia: Secondary | ICD-10-CM | POA: Diagnosis not present

## 2015-05-27 DIAGNOSIS — H52223 Regular astigmatism, bilateral: Secondary | ICD-10-CM | POA: Diagnosis not present

## 2015-06-03 DIAGNOSIS — M5136 Other intervertebral disc degeneration, lumbar region: Secondary | ICD-10-CM | POA: Diagnosis not present

## 2015-06-17 DIAGNOSIS — M5137 Other intervertebral disc degeneration, lumbosacral region: Secondary | ICD-10-CM | POA: Diagnosis not present

## 2015-06-17 DIAGNOSIS — M961 Postlaminectomy syndrome, not elsewhere classified: Secondary | ICD-10-CM | POA: Diagnosis not present

## 2015-07-12 DIAGNOSIS — M5137 Other intervertebral disc degeneration, lumbosacral region: Secondary | ICD-10-CM | POA: Diagnosis not present

## 2015-07-12 DIAGNOSIS — M5416 Radiculopathy, lumbar region: Secondary | ICD-10-CM | POA: Diagnosis not present

## 2015-07-12 DIAGNOSIS — M961 Postlaminectomy syndrome, not elsewhere classified: Secondary | ICD-10-CM | POA: Diagnosis not present

## 2015-07-20 DIAGNOSIS — Z96652 Presence of left artificial knee joint: Secondary | ICD-10-CM | POA: Diagnosis not present

## 2015-07-20 DIAGNOSIS — Z471 Aftercare following joint replacement surgery: Secondary | ICD-10-CM | POA: Diagnosis not present

## 2015-08-10 DIAGNOSIS — M5137 Other intervertebral disc degeneration, lumbosacral region: Secondary | ICD-10-CM | POA: Diagnosis not present

## 2015-08-26 DIAGNOSIS — I10 Essential (primary) hypertension: Secondary | ICD-10-CM | POA: Diagnosis not present

## 2015-08-26 DIAGNOSIS — I890 Lymphedema, not elsewhere classified: Secondary | ICD-10-CM | POA: Diagnosis not present

## 2015-08-26 DIAGNOSIS — E782 Mixed hyperlipidemia: Secondary | ICD-10-CM | POA: Diagnosis not present

## 2015-08-26 DIAGNOSIS — E1139 Type 2 diabetes mellitus with other diabetic ophthalmic complication: Secondary | ICD-10-CM | POA: Diagnosis not present

## 2015-08-26 DIAGNOSIS — N183 Chronic kidney disease, stage 3 (moderate): Secondary | ICD-10-CM | POA: Diagnosis not present

## 2015-08-26 DIAGNOSIS — Z23 Encounter for immunization: Secondary | ICD-10-CM | POA: Diagnosis not present

## 2015-08-26 DIAGNOSIS — E559 Vitamin D deficiency, unspecified: Secondary | ICD-10-CM | POA: Diagnosis not present

## 2015-08-26 DIAGNOSIS — Z0000 Encounter for general adult medical examination without abnormal findings: Secondary | ICD-10-CM | POA: Diagnosis not present

## 2015-08-26 DIAGNOSIS — Z1389 Encounter for screening for other disorder: Secondary | ICD-10-CM | POA: Diagnosis not present

## 2015-08-26 DIAGNOSIS — Z79899 Other long term (current) drug therapy: Secondary | ICD-10-CM | POA: Diagnosis not present

## 2015-08-26 DIAGNOSIS — M8588 Other specified disorders of bone density and structure, other site: Secondary | ICD-10-CM | POA: Diagnosis not present

## 2015-08-26 DIAGNOSIS — E1165 Type 2 diabetes mellitus with hyperglycemia: Secondary | ICD-10-CM | POA: Diagnosis not present

## 2015-08-31 DIAGNOSIS — M5137 Other intervertebral disc degeneration, lumbosacral region: Secondary | ICD-10-CM | POA: Diagnosis not present

## 2015-08-31 DIAGNOSIS — M5416 Radiculopathy, lumbar region: Secondary | ICD-10-CM | POA: Diagnosis not present

## 2015-08-31 DIAGNOSIS — M961 Postlaminectomy syndrome, not elsewhere classified: Secondary | ICD-10-CM | POA: Diagnosis not present

## 2015-09-02 DIAGNOSIS — N183 Chronic kidney disease, stage 3 (moderate): Secondary | ICD-10-CM | POA: Diagnosis not present

## 2015-09-07 DIAGNOSIS — M4806 Spinal stenosis, lumbar region: Secondary | ICD-10-CM | POA: Diagnosis not present

## 2015-09-07 DIAGNOSIS — M9903 Segmental and somatic dysfunction of lumbar region: Secondary | ICD-10-CM | POA: Diagnosis not present

## 2015-09-21 DIAGNOSIS — Z471 Aftercare following joint replacement surgery: Secondary | ICD-10-CM | POA: Diagnosis not present

## 2015-09-21 DIAGNOSIS — Z96652 Presence of left artificial knee joint: Secondary | ICD-10-CM | POA: Diagnosis not present

## 2015-10-05 DIAGNOSIS — M9903 Segmental and somatic dysfunction of lumbar region: Secondary | ICD-10-CM | POA: Diagnosis not present

## 2015-10-05 DIAGNOSIS — M4806 Spinal stenosis, lumbar region: Secondary | ICD-10-CM | POA: Diagnosis not present

## 2015-12-29 DIAGNOSIS — N183 Chronic kidney disease, stage 3 (moderate): Secondary | ICD-10-CM | POA: Diagnosis not present

## 2015-12-29 DIAGNOSIS — E119 Type 2 diabetes mellitus without complications: Secondary | ICD-10-CM | POA: Diagnosis not present

## 2015-12-29 DIAGNOSIS — I129 Hypertensive chronic kidney disease with stage 1 through stage 4 chronic kidney disease, or unspecified chronic kidney disease: Secondary | ICD-10-CM | POA: Diagnosis not present

## 2016-01-30 DIAGNOSIS — J069 Acute upper respiratory infection, unspecified: Secondary | ICD-10-CM | POA: Diagnosis not present

## 2016-01-31 DIAGNOSIS — Z1231 Encounter for screening mammogram for malignant neoplasm of breast: Secondary | ICD-10-CM | POA: Diagnosis not present

## 2016-03-09 DIAGNOSIS — Z471 Aftercare following joint replacement surgery: Secondary | ICD-10-CM | POA: Diagnosis not present

## 2016-03-09 DIAGNOSIS — Z96652 Presence of left artificial knee joint: Secondary | ICD-10-CM | POA: Diagnosis not present

## 2016-04-25 DIAGNOSIS — M25561 Pain in right knee: Secondary | ICD-10-CM | POA: Diagnosis not present

## 2016-04-25 DIAGNOSIS — E1139 Type 2 diabetes mellitus with other diabetic ophthalmic complication: Secondary | ICD-10-CM | POA: Diagnosis not present

## 2016-04-25 DIAGNOSIS — Z7984 Long term (current) use of oral hypoglycemic drugs: Secondary | ICD-10-CM | POA: Diagnosis not present

## 2016-04-25 DIAGNOSIS — E782 Mixed hyperlipidemia: Secondary | ICD-10-CM | POA: Diagnosis not present

## 2016-04-25 DIAGNOSIS — M4806 Spinal stenosis, lumbar region: Secondary | ICD-10-CM | POA: Diagnosis not present

## 2016-04-25 DIAGNOSIS — E1165 Type 2 diabetes mellitus with hyperglycemia: Secondary | ICD-10-CM | POA: Diagnosis not present

## 2016-04-25 DIAGNOSIS — I890 Lymphedema, not elsewhere classified: Secondary | ICD-10-CM | POA: Diagnosis not present

## 2016-04-25 DIAGNOSIS — I10 Essential (primary) hypertension: Secondary | ICD-10-CM | POA: Diagnosis not present

## 2016-04-25 DIAGNOSIS — E559 Vitamin D deficiency, unspecified: Secondary | ICD-10-CM | POA: Diagnosis not present

## 2016-04-25 DIAGNOSIS — M25562 Pain in left knee: Secondary | ICD-10-CM | POA: Diagnosis not present

## 2016-04-25 DIAGNOSIS — N183 Chronic kidney disease, stage 3 (moderate): Secondary | ICD-10-CM | POA: Diagnosis not present

## 2016-05-15 DIAGNOSIS — M5416 Radiculopathy, lumbar region: Secondary | ICD-10-CM | POA: Diagnosis not present

## 2016-05-16 ENCOUNTER — Other Ambulatory Visit: Payer: Self-pay | Admitting: Orthopedic Surgery

## 2016-05-16 DIAGNOSIS — M5416 Radiculopathy, lumbar region: Secondary | ICD-10-CM

## 2016-05-22 ENCOUNTER — Ambulatory Visit
Admission: RE | Admit: 2016-05-22 | Discharge: 2016-05-22 | Disposition: A | Discharge status: Home / Self Care | Payer: Medicare Other | Source: Ambulatory Visit | Attending: Orthopedic Surgery | Admitting: Orthopedic Surgery

## 2016-05-22 DIAGNOSIS — M4806 Spinal stenosis, lumbar region: Secondary | ICD-10-CM | POA: Diagnosis not present

## 2016-05-22 DIAGNOSIS — M5416 Radiculopathy, lumbar region: Secondary | ICD-10-CM

## 2016-05-26 DIAGNOSIS — M4806 Spinal stenosis, lumbar region: Secondary | ICD-10-CM | POA: Diagnosis not present

## 2016-05-26 DIAGNOSIS — M545 Low back pain: Secondary | ICD-10-CM | POA: Diagnosis not present

## 2016-05-29 DIAGNOSIS — H5203 Hypermetropia, bilateral: Secondary | ICD-10-CM | POA: Diagnosis not present

## 2016-05-29 DIAGNOSIS — H25813 Combined forms of age-related cataract, bilateral: Secondary | ICD-10-CM | POA: Diagnosis not present

## 2016-05-29 DIAGNOSIS — H52223 Regular astigmatism, bilateral: Secondary | ICD-10-CM | POA: Diagnosis not present

## 2016-05-29 DIAGNOSIS — E119 Type 2 diabetes mellitus without complications: Secondary | ICD-10-CM | POA: Diagnosis not present

## 2016-06-08 DIAGNOSIS — M4806 Spinal stenosis, lumbar region: Secondary | ICD-10-CM | POA: Diagnosis not present

## 2016-06-10 IMAGING — NM NM BONE 3 PHASE
2 series · 12 of 12 positions shown · non-contrast
Comparison: None.

CLINICAL DATA: right knee pain. Status post total knee
arthroplasty.

EXAM:
NUCLEAR MEDICINE 3-PHASE BONE SCAN
TECHNIQUE: Radionuclide angiographic images, immediate static blood pool
images, and 3-hour delayed static images were obtained of the knees
after intravenous injection of radiopharmaceutical.
RADIOPHARMACEUTICALS:  24.8 Technetium 99 MDP

[Series 1: fl flow and static · 4.75mm/px · 6 of 40 frames shown (1 of 2)]
[frame 4/40  full-range]
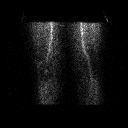
[frame 10/40  full-range]
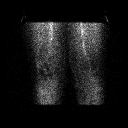
[frame 17/40  full-range]
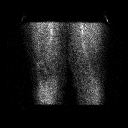
[frame 24/40  full-range]
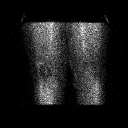
[frame 30/40  full-range]
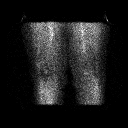
[frame 37/40  full-range]
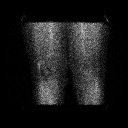

[Series 1: fl flow and static · 4.75mm/px · 6 of 40 frames shown (2 of 2)]
[frame 4/40  full-range]
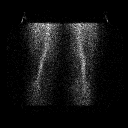
[frame 10/40  full-range]
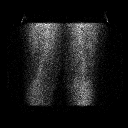
[frame 17/40  full-range]
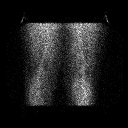
[frame 24/40  full-range]
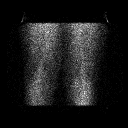
[frame 30/40  full-range]
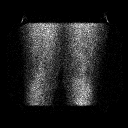
[frame 37/40  full-range]
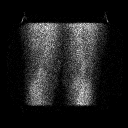

[12 of 12 positions shown; findings below may reference images not displayed]

FINDINGS: Vascular phase: On the flow phase portion of the examination there
is symmetric radiotracer activity to both knees.

Blood pool phase: Very mild increased uptake is identified along the
lateral aspect of the femoral component of the right knee
arthroplasty device. Degenerative type changes are noted within the
medial compartment of the left knee.

Delayed phase: No abnormal asymmetric uptake is identified
localizing to the right knee. Degenerative type changes are noted
localizing to the left knee.
IMPRESSION: 1. No evidence for right knee arthroplasty device loosening or
infection.

## 2016-06-27 DIAGNOSIS — M25662 Stiffness of left knee, not elsewhere classified: Secondary | ICD-10-CM | POA: Diagnosis not present

## 2016-06-27 DIAGNOSIS — M5416 Radiculopathy, lumbar region: Secondary | ICD-10-CM | POA: Diagnosis not present

## 2016-06-27 DIAGNOSIS — R2689 Other abnormalities of gait and mobility: Secondary | ICD-10-CM | POA: Diagnosis not present

## 2016-07-03 DIAGNOSIS — M5416 Radiculopathy, lumbar region: Secondary | ICD-10-CM | POA: Diagnosis not present

## 2016-07-03 DIAGNOSIS — R2689 Other abnormalities of gait and mobility: Secondary | ICD-10-CM | POA: Diagnosis not present

## 2016-07-03 DIAGNOSIS — M25662 Stiffness of left knee, not elsewhere classified: Secondary | ICD-10-CM | POA: Diagnosis not present

## 2016-07-06 DIAGNOSIS — M25662 Stiffness of left knee, not elsewhere classified: Secondary | ICD-10-CM | POA: Diagnosis not present

## 2016-07-06 DIAGNOSIS — M5416 Radiculopathy, lumbar region: Secondary | ICD-10-CM | POA: Diagnosis not present

## 2016-07-06 DIAGNOSIS — R2689 Other abnormalities of gait and mobility: Secondary | ICD-10-CM | POA: Diagnosis not present

## 2016-07-07 DIAGNOSIS — M5416 Radiculopathy, lumbar region: Secondary | ICD-10-CM | POA: Diagnosis not present

## 2016-07-10 DIAGNOSIS — M25662 Stiffness of left knee, not elsewhere classified: Secondary | ICD-10-CM | POA: Diagnosis not present

## 2016-07-10 DIAGNOSIS — R2689 Other abnormalities of gait and mobility: Secondary | ICD-10-CM | POA: Diagnosis not present

## 2016-07-10 DIAGNOSIS — M5416 Radiculopathy, lumbar region: Secondary | ICD-10-CM | POA: Diagnosis not present

## 2016-07-13 DIAGNOSIS — R2689 Other abnormalities of gait and mobility: Secondary | ICD-10-CM | POA: Diagnosis not present

## 2016-07-13 DIAGNOSIS — M5416 Radiculopathy, lumbar region: Secondary | ICD-10-CM | POA: Diagnosis not present

## 2016-07-13 DIAGNOSIS — M25662 Stiffness of left knee, not elsewhere classified: Secondary | ICD-10-CM | POA: Diagnosis not present

## 2016-07-17 DIAGNOSIS — M25662 Stiffness of left knee, not elsewhere classified: Secondary | ICD-10-CM | POA: Diagnosis not present

## 2016-07-17 DIAGNOSIS — M5416 Radiculopathy, lumbar region: Secondary | ICD-10-CM | POA: Diagnosis not present

## 2016-07-17 DIAGNOSIS — R2689 Other abnormalities of gait and mobility: Secondary | ICD-10-CM | POA: Diagnosis not present

## 2016-07-20 DIAGNOSIS — R2689 Other abnormalities of gait and mobility: Secondary | ICD-10-CM | POA: Diagnosis not present

## 2016-07-20 DIAGNOSIS — M5416 Radiculopathy, lumbar region: Secondary | ICD-10-CM | POA: Diagnosis not present

## 2016-07-20 DIAGNOSIS — M25662 Stiffness of left knee, not elsewhere classified: Secondary | ICD-10-CM | POA: Diagnosis not present

## 2016-07-24 DIAGNOSIS — M25662 Stiffness of left knee, not elsewhere classified: Secondary | ICD-10-CM | POA: Diagnosis not present

## 2016-07-24 DIAGNOSIS — R2689 Other abnormalities of gait and mobility: Secondary | ICD-10-CM | POA: Diagnosis not present

## 2016-07-24 DIAGNOSIS — M5416 Radiculopathy, lumbar region: Secondary | ICD-10-CM | POA: Diagnosis not present

## 2016-07-27 DIAGNOSIS — M25662 Stiffness of left knee, not elsewhere classified: Secondary | ICD-10-CM | POA: Diagnosis not present

## 2016-07-27 DIAGNOSIS — M5416 Radiculopathy, lumbar region: Secondary | ICD-10-CM | POA: Diagnosis not present

## 2016-07-27 DIAGNOSIS — R2689 Other abnormalities of gait and mobility: Secondary | ICD-10-CM | POA: Diagnosis not present

## 2016-07-31 DIAGNOSIS — M25662 Stiffness of left knee, not elsewhere classified: Secondary | ICD-10-CM | POA: Diagnosis not present

## 2016-07-31 DIAGNOSIS — R2689 Other abnormalities of gait and mobility: Secondary | ICD-10-CM | POA: Diagnosis not present

## 2016-07-31 DIAGNOSIS — M5416 Radiculopathy, lumbar region: Secondary | ICD-10-CM | POA: Diagnosis not present

## 2016-08-03 DIAGNOSIS — R2689 Other abnormalities of gait and mobility: Secondary | ICD-10-CM | POA: Diagnosis not present

## 2016-08-03 DIAGNOSIS — M5416 Radiculopathy, lumbar region: Secondary | ICD-10-CM | POA: Diagnosis not present

## 2016-08-03 DIAGNOSIS — M25662 Stiffness of left knee, not elsewhere classified: Secondary | ICD-10-CM | POA: Diagnosis not present

## 2016-08-08 DIAGNOSIS — I129 Hypertensive chronic kidney disease with stage 1 through stage 4 chronic kidney disease, or unspecified chronic kidney disease: Secondary | ICD-10-CM | POA: Diagnosis not present

## 2016-08-08 DIAGNOSIS — E119 Type 2 diabetes mellitus without complications: Secondary | ICD-10-CM | POA: Diagnosis not present

## 2016-08-08 DIAGNOSIS — M5416 Radiculopathy, lumbar region: Secondary | ICD-10-CM | POA: Diagnosis not present

## 2016-08-08 DIAGNOSIS — R2689 Other abnormalities of gait and mobility: Secondary | ICD-10-CM | POA: Diagnosis not present

## 2016-08-08 DIAGNOSIS — M25662 Stiffness of left knee, not elsewhere classified: Secondary | ICD-10-CM | POA: Diagnosis not present

## 2016-08-08 DIAGNOSIS — N183 Chronic kidney disease, stage 3 (moderate): Secondary | ICD-10-CM | POA: Diagnosis not present

## 2016-08-10 DIAGNOSIS — M5416 Radiculopathy, lumbar region: Secondary | ICD-10-CM | POA: Diagnosis not present

## 2016-08-10 DIAGNOSIS — M25662 Stiffness of left knee, not elsewhere classified: Secondary | ICD-10-CM | POA: Diagnosis not present

## 2016-08-10 DIAGNOSIS — R2689 Other abnormalities of gait and mobility: Secondary | ICD-10-CM | POA: Diagnosis not present

## 2016-08-14 DIAGNOSIS — M25662 Stiffness of left knee, not elsewhere classified: Secondary | ICD-10-CM | POA: Diagnosis not present

## 2016-08-14 DIAGNOSIS — R2689 Other abnormalities of gait and mobility: Secondary | ICD-10-CM | POA: Diagnosis not present

## 2016-08-14 DIAGNOSIS — M5416 Radiculopathy, lumbar region: Secondary | ICD-10-CM | POA: Diagnosis not present

## 2016-08-17 DIAGNOSIS — M5416 Radiculopathy, lumbar region: Secondary | ICD-10-CM | POA: Diagnosis not present

## 2016-08-17 DIAGNOSIS — M25662 Stiffness of left knee, not elsewhere classified: Secondary | ICD-10-CM | POA: Diagnosis not present

## 2016-08-17 DIAGNOSIS — R2689 Other abnormalities of gait and mobility: Secondary | ICD-10-CM | POA: Diagnosis not present

## 2016-08-21 DIAGNOSIS — M5416 Radiculopathy, lumbar region: Secondary | ICD-10-CM | POA: Diagnosis not present

## 2016-08-21 DIAGNOSIS — M25662 Stiffness of left knee, not elsewhere classified: Secondary | ICD-10-CM | POA: Diagnosis not present

## 2016-08-21 DIAGNOSIS — R2689 Other abnormalities of gait and mobility: Secondary | ICD-10-CM | POA: Diagnosis not present

## 2016-08-24 DIAGNOSIS — M5416 Radiculopathy, lumbar region: Secondary | ICD-10-CM | POA: Diagnosis not present

## 2016-08-24 DIAGNOSIS — M25662 Stiffness of left knee, not elsewhere classified: Secondary | ICD-10-CM | POA: Diagnosis not present

## 2016-08-24 DIAGNOSIS — R2689 Other abnormalities of gait and mobility: Secondary | ICD-10-CM | POA: Diagnosis not present

## 2016-08-28 DIAGNOSIS — R2689 Other abnormalities of gait and mobility: Secondary | ICD-10-CM | POA: Diagnosis not present

## 2016-08-28 DIAGNOSIS — M25662 Stiffness of left knee, not elsewhere classified: Secondary | ICD-10-CM | POA: Diagnosis not present

## 2016-08-28 DIAGNOSIS — M5416 Radiculopathy, lumbar region: Secondary | ICD-10-CM | POA: Diagnosis not present

## 2016-08-29 DIAGNOSIS — E559 Vitamin D deficiency, unspecified: Secondary | ICD-10-CM | POA: Diagnosis not present

## 2016-08-29 DIAGNOSIS — E782 Mixed hyperlipidemia: Secondary | ICD-10-CM | POA: Diagnosis not present

## 2016-08-29 DIAGNOSIS — M791 Myalgia: Secondary | ICD-10-CM | POA: Diagnosis not present

## 2016-08-29 DIAGNOSIS — M4806 Spinal stenosis, lumbar region: Secondary | ICD-10-CM | POA: Diagnosis not present

## 2016-08-29 DIAGNOSIS — Z0000 Encounter for general adult medical examination without abnormal findings: Secondary | ICD-10-CM | POA: Diagnosis not present

## 2016-08-29 DIAGNOSIS — M8588 Other specified disorders of bone density and structure, other site: Secondary | ICD-10-CM | POA: Diagnosis not present

## 2016-08-29 DIAGNOSIS — I890 Lymphedema, not elsewhere classified: Secondary | ICD-10-CM | POA: Diagnosis not present

## 2016-08-29 DIAGNOSIS — E1139 Type 2 diabetes mellitus with other diabetic ophthalmic complication: Secondary | ICD-10-CM | POA: Diagnosis not present

## 2016-08-29 DIAGNOSIS — N183 Chronic kidney disease, stage 3 (moderate): Secondary | ICD-10-CM | POA: Diagnosis not present

## 2016-08-29 DIAGNOSIS — I10 Essential (primary) hypertension: Secondary | ICD-10-CM | POA: Diagnosis not present

## 2016-08-29 DIAGNOSIS — Z23 Encounter for immunization: Secondary | ICD-10-CM | POA: Diagnosis not present

## 2016-08-29 DIAGNOSIS — E1165 Type 2 diabetes mellitus with hyperglycemia: Secondary | ICD-10-CM | POA: Diagnosis not present

## 2016-08-31 DIAGNOSIS — M5416 Radiculopathy, lumbar region: Secondary | ICD-10-CM | POA: Diagnosis not present

## 2016-08-31 DIAGNOSIS — R2689 Other abnormalities of gait and mobility: Secondary | ICD-10-CM | POA: Diagnosis not present

## 2016-08-31 DIAGNOSIS — M25662 Stiffness of left knee, not elsewhere classified: Secondary | ICD-10-CM | POA: Diagnosis not present

## 2016-09-04 DIAGNOSIS — M810 Age-related osteoporosis without current pathological fracture: Secondary | ICD-10-CM | POA: Diagnosis not present

## 2016-10-09 DIAGNOSIS — J069 Acute upper respiratory infection, unspecified: Secondary | ICD-10-CM | POA: Diagnosis not present

## 2016-10-09 DIAGNOSIS — M810 Age-related osteoporosis without current pathological fracture: Secondary | ICD-10-CM | POA: Diagnosis not present

## 2016-10-09 DIAGNOSIS — I890 Lymphedema, not elsewhere classified: Secondary | ICD-10-CM | POA: Diagnosis not present

## 2016-11-02 ENCOUNTER — Encounter: Payer: Self-pay | Admitting: Occupational Therapy

## 2016-11-02 ENCOUNTER — Ambulatory Visit: Payer: Medicare Other | Attending: Family Medicine | Admitting: Occupational Therapy

## 2016-11-02 DIAGNOSIS — I890 Lymphedema, not elsewhere classified: Secondary | ICD-10-CM | POA: Diagnosis not present

## 2016-11-02 NOTE — Therapy (Signed)
[facility] [address] [place], [place], [address] Phone: [phone]   Fax:  [phone]  Occupational Therapy Evaluation  Patient Details  Name: Holly Fields MRN: [id number] Date of Birth: 09/08/1941 Referring Provider: childhood, exacerbations 2010 and 2016  Encounter Date: 11/02/2016      OT End of Session - 11/02/16 1128    Visit Number 1   Number of Visits 36   Date for OT Re-Evaluation 01/31/17   OT Start Time 0904   OT Stop Time 1015   OT Time Calculation (min) 71 min   Equipment Utilized During Treatment Lymphedema Management and Self Care Workbook- hard copy provided   Activity Tolerance Patient tolerated treatment well;No increased pain   Behavior During Therapy WFL for tasks assessed/performed      Past Medical History:  Diagnosis Date  . Arthritis   . CKD (chronic kidney disease)     saw dr Lowell Guitarpowell 2 years ago, now released from nephrology  . Complication of anesthesia 2010   "sluggish after anesthesia and had vertigo", slow to awaken after knee artroscopy 2012  . Diabetes (HCC)   . Eczema    at times  . Edema   . GERD (gastroesophageal reflux disease)   . HTN (hypertension)   . Hypercholesteremia   . Lymphedema of leg    right more than RIGHT  . Narrowing of airway 2010  . Numbness and tingling of right leg   . Osteopenia   . PONV (postoperative nausea and vomiting)    pt has n/v and vertigo after anesthesia  . Vertigo    at times, cannot turn on left side quickly or sleep on left side  . Vitamin D deficiency     Past Surgical History:  Procedure Laterality Date  . arthroscopic knee surgery Left 2012  . CARDIAC CATHETERIZATION  2012  . JOINT REPLACEMENT Right 2010  . LUMBAR LAMINECTOMY/DECOMPRESSION MICRODISCECTOMY N/A 10/22/2013   Procedure: MICROLUMBAR DECOMPRESSION LUMBAR TWO TO THREE, LUMBAR THREE TO FOUR and faramenotomy two,threeand four ,five bilateral;  Surgeon: Javier DockerJeffrey C  Beane, MD;  Location: [facility];  Service: Orthopedics;  Laterality: N/A;  . NASAL SINUS SURGERY  yrs ago    x 2  . TOTAL KNEE ARTHROPLASTY Left 03/01/2015   Procedure: LEFT TOTAL KNEE ARTHROPLASTY;  Surgeon: Ollen GrossFrank Aluisio, MD;  Location: [facility];  Service: Orthopedics;  Laterality: Left;    There were no vitals filed for this visit.      Subjective Assessment - 11/02/16 1111    Subjective  Pt is referred by Juluis RainierElizabeth Barnes, MD for evaluation and treatment of BLE  lymphedema. Pt reports she had intermittent leg swelling during childhhood when swelling would come and go. Swelling became unresolving in her 6250s. Pt reports exacerbations of leg swelling after knee replacement surgeries and subsequent reahbilitation courses in 2010 and 2016. At present swelling and associated pain limit functional performance in all occupational domains..   Patient is accompained by: Family member   Pertinent History d   Limitations BLE pain and swelling causes difficulty walking, difficulty w/ transfers and decreased functional mobility, decreased standing tolerance, decreased basic and instrumental ADLs performance, ( difficulty sleeping, lower body dressing, home management activities, social and community participation, decreased body image 2/2  feet and legsdisfigurement   Repetition Increases Symptoms   Patient Stated Goals decrease pain and leg swelling and keep it down so I can do more; get into a pair of  pretty boots, sleep better,  do more volunteering   Currently in Pain? Yes   Pain Score 9    Pain Location Leg   Pain Orientation Right;Left   Pain Descriptors / Indicators Tightness;Tingling;Tender;Sore;Numbness;Burning;Heaviness;Tiring   Pain Type Intractable pain   Pain Onset More than a month ago   Pain Frequency Intermittent   Aggravating Factors  standing, walking   Pain Relieving Factors elevation in recliner   Effect of Pain on Daily Activities see limitations avove   Multiple Pain Sites Yes    Pain Score 9   Pain Location Foot   Pain Orientation Right;Left           [facility] OT Assessment - 11/02/16 0001      Assessment   Diagnosis Mild-moderate, stage 2, BLE lymphedema w/ possible primary etiology in addition  to other contributing medical factors   Referring Provider childhood, exacerbations 2010 and 2016   Prior Therapy yes, compression knee highs after CD[facility]  [facility] [place] [facility]     Precautions   Precautions Other (comment)   Precaution Comments Skin precautions 2/2 DM     Restrictions   Weight Bearing Restrictions No     Balance Screen   Has the patient fallen in the past 6 months No   Is the patient reluctant to leave their home because of a fear of falling?  Yes  reluctant 2/2 leg pain and swelling, not fear of falling     Prior Function   Level of Independence Independent with basic ADLs;Independent with household mobility without device;Independent with community mobility without device;Independent with homemaking with ambulation;Independent with transfers   Vocation Retired   [employer] Requirements --  retired Chief Executive Officerelementary school principal   Leisure family, friends, church, volunteering     ADL   Lower Body Bathing Patient Percentage --  able to reach feet to bathe and perform skin care. would beo   Lower Body Dressing Other (comment)  difficulty fitting LB clothing and street shoes 2/2 swelling     IADL   Prior Level of Function Shopping I   Light Housekeeping Does personal laundry completely;Launders small items, rinses stockings, etc.;Performs light daily tasks such as dishwashing, bed making;Needs help with all home maintenance tasks   Prior Level of Function Meal Prep I   Meal Prep Prepares adequate meal if supplied with ingredients;Able to complete simple warm meal prep   Community Mobility Drives own vehicle;Relies on family or friends for transportation   Prior Level of Function Meal Prep I     Mobility   Mobility Status  Comments wide based gait. difficulty w/ car transfers, needs arm rests for sit to stand      Activity Tolerance   Activity Tolerance Endurance does not limit participation in activity   Sitting Balance --  activity tolerance limited by leg swelling and pain     Cognition   Overall Cognitive Status Within Functional Limits for tasks assessed     Observation/Other Assessments   Observations BLE swelling from toes to groin with mid, difuse palpable fibrosis.    Skin Integrity Sx scars at B knees are mobile. Deep skin creases at toes indicative of long term swelling . Elastic compression stockings too long and causing tourniquet effect above B ankles.    Focus on Therapeutic Outcomes (FOTO)  often associated w/ primary LE onset. Positive stemmer sign. Skin well hydrated and cool to palpation. No flaking,, dryness or callouses. Hemociderine stain absent.     Sensation   Light Touch  Impaired by gross assessment          LYMPHEDEMA/ONCOLOGY QUESTIONNAIRE - 11/02/16 1153      What other symptoms do you have   Are you Having Heaviness or Tightness Yes   Are you having Pain Yes   Are you having pitting edema No   Is it Hard or Difficult finding clothes that fit Yes   Do you have infections No   Is there Decreased scar mobility No   Stemmer Sign Yes   Other Symptoms No hx leg wounds/ ulcers, or infections     Lymphedema Stage   Stage STAGE 2 SPONTANEOUSLY IRREVERSIBLE     Lymphedema Assessments   Lymphedema Assessments Lower extremities     Right Lower Extremity Lymphedema   Other BLE Comparative volumetrics TBA Rx visit 1                OT Treatments/Exercises (OP) - 11/02/16 0001      Manual Therapy   Manual Therapy Edema management;Soft tissue mobilization               OT Education - 11/02/16 1127    Education provided Yes   Education Details Provided Pt/caregiver skilled education and ADL training throughout visit for lymphedema etiology, progression,  and treatment including Intensive and Management Phase Complete Decongestive Therapy (CDT)  Discussed lymphedema precautions, cellulitis risk, and all CDT and LE self-care components, including compression wrapping/ garments & devices, lymphatic pumping ther ex, simple self-MLD, and skin care. Provided printed Lymphedema Workbook for reference.   Person(s) Educated Patient;Spouse   Methods Explanation;Demonstration;Handout   Comprehension Verbalized understanding;Need further instruction             OT Long Term Goals - 11/02/16 1205      OT LONG TERM GOAL #1   Title Lymphedema (LE) management/ self-care: Pt able to apply multi layered, gradient compression wraps with MAX caregiver assistance using proper techniques within 2 weeks to achieve optimal limb volume reduction.   Baseline dependent   Time 2   Period Weeks   Status New     OT LONG TERM GOAL #2   Title Lymphedema (LE) management/ self-care:  Pt to achieve at least 10% LLE limb volume reductions bilaterally during Intensive CDT to limit LE progression, decrease infection and falls risk, to reduce pain/,  to improve safe ambulation and functional mobility, and to improve ability to fit preferred LB clothing and street shoes..   Baseline dependent   Time 12   Period Weeks   Status New     OT LONG TERM GOAL #3   Title Lymphedema (LE) management/ self-care:  Pt >/= 85 % compliant with all daily, LE self-care protocols for home program w/ needed level of caregiver assistance , including simple self-manual lymphatic drainage (MLD), skin care, lymphatic pumping the ex, skin care, and donning/ doffing compression wraps and garments o limit LE progression and further functional decline.     Baseline dependent   Time 12   Period Weeks   Status New     OT LONG TERM GOAL #4   Title Lymphedema (LE) management/ self-care:  Pt to tolerate daily compression wraps, garments and devices in keeping w/ prescribed wear regime within 1 week of  issue date to progress and retain clinical and functional gains and to limit LE progression.   Baseline dependent   Time 12   Period Weeks   Status New     OT LONG TERM GOAL #5   Title  Lymphedema (LE) self-care:  During Management Phase CDT Pt to sustain limb volume reductions achieved during Intensive Phase CDT within 5% utilizing LE self-care protocols, appropriate compression garments/ devices, and needed level of caregiver assistance.   Baseline dependent   Time 6   Period Months   Status New               Plan - November 20, 2016 1155    Rehab Potential Good   OT Frequency 3x / week   OT Duration 12 weeks   OT Treatment/Interventions Self-care/ADL training;Therapeutic exercise;Functional Mobility Training;Patient/family education;Manual Therapy;Manual lymph drainage;Other (comment);DME and/or AE instruction;Compression bandaging;Therapeutic activities  skin care   Plan Pt presents with mild-moderate, stage II, BLE lymphedema (LE) w/ suspected primary etiology. Contributing factors include OA inflammation, CKD, HTN, and peripheral vascular disease. Pt reports leg swelling started in childhood without known precipitating event. Swelling has worsened over time and no longer resolves. Pt reports exacerbations in 2010 and 2016 s/p total knee arthroplasties. Chronic, progressive, BLE LE and associated pain and discomfort limits functional mobility and ambulation, limits basic and instrumental ADLs performance, including lower body dressing, home management tasks, limits productive , leisure and social activities and limits community participation. Leg swelling and pain negatively impact body image due to disfigurement. Skilled Occupational Therapy for LE care is medically necessary to reduce uncontrolled swelling and associated leg and foot pain, to decrease risk of recurrent infections and diabetic skin complications, to improve safe ambulation and functional mobility and to limit fall risk due to  body asymmetry, to increase level of independence with basic and instrumental ADLs, including mastery all LE self-care skills. Pt may also benefit from Physical Therapy evaluation and treatment to address decreased LE strength and atypical gait. Without skilled Occupational Therapy for Intensive and Management phase Complete Decongestive Therapy (CDT) and ADL training for lymphedema-self management, this patient's condition is likely to worsen and further functional decline is expected.   Recommended Other Services Fit w/ custom, flat knit, BLE compression garments that are easy to don and doff and are comfortable, fit w/ HOS devices bilaterally to limit additional fibrosis formation and LE progression      Patient will benefit from skilled therapeutic intervention in order to improve the following deficits and impairments:  Abnormal gait, Decreased skin integrity, Decreased knowledge of precautions, Improper body mechanics, Decreased activity tolerance, Decreased knowledge of use of DME, Impaired flexibility, Decreased balance, Decreased mobility, Difficulty walking, Impaired sensation, Decreased range of motion, Increased edema, Pain  Visit Diagnosis: Lymphedema, not elsewhere classified - Plan: Ot plan of care cert/re-cert      G-Codes - Nov 20, 2016 1130    Functional Assessment Tool Used Clinical observation, physical examination, medical records review for H&P, Pt and caregiver interview, comparative limb volumetrics   Functional Limitation Self care   Self Care Current Status (U9811) At least 80 percent but less than 100 percent impaired, limited or restricted   Self Care Goal Status (B1478) At least 1 percent but less than 20 percent impaired, limited or restricted      Problem List Patient Active Problem List   Diagnosis Date Noted  . OA (osteoarthritis) of knee 03/01/2015  . Preoperative cardiovascular examination 01/05/2015  . Spinal stenosis of lumbar region 10/22/2013    Loel Dubonnet, MS, OTR/L, [facility] 20-Nov-2016 12:10 PM  [facility] MAIN [facility] SERVICES [address] [place], [place], [address] Phone: [phone]   Fax:  [phone]  Name: TELEAH VILLAMAR MRN: [id number] Date of Birth:  02/24/1941  

## 2016-11-02 NOTE — Patient Instructions (Signed)

## 2016-11-09 ENCOUNTER — Ambulatory Visit: Payer: Medicare Other | Attending: Family Medicine | Admitting: Occupational Therapy

## 2016-11-09 DIAGNOSIS — I890 Lymphedema, not elsewhere classified: Secondary | ICD-10-CM | POA: Insufficient documentation

## 2016-11-09 NOTE — Patient Instructions (Signed)

## 2016-11-09 NOTE — Therapy (Signed)
[facility] MAIN [facility] SERVICES [address] [place], [place], [address] Phone: [phone]   Fax:  [phone]  Occupational Therapy Treatment  Patient Details  Name: Holly Fields MRN: [id number] Date of Birth: 09/27/1941 Referring Provider: childhood, exacerbations 2010 and 2016  Encounter Date: 11/09/2016      OT End of Session - 11/09/16 1226    Visit Number 2   Number of Visits 36   Date for OT Re-Evaluation 01/31/17   OT Start Time 0805   OT Stop Time 0909   OT Time Calculation (min) 64 min   Equipment Utilized During Treatment Lymphedema Management and Self Care Workbook- hard copy provided   Activity Tolerance Patient tolerated treatment well;No increased pain   Behavior During Therapy WFL for tasks assessed/performed      Past Medical History:  Diagnosis Date  . Arthritis   . CKD (chronic kidney disease)     saw dr Lowell Guitar 2 years ago, now released from nephrology  . Complication of anesthesia 2010   "sluggish after anesthesia and had vertigo", slow to awaken after knee artroscopy 2012  . Diabetes (HCC)   . Eczema    at times  . Edema   . GERD (gastroesophageal reflux disease)   . HTN (hypertension)   . Hypercholesteremia   . Lymphedema of leg    right more than RIGHT  . Narrowing of airway 2010  . Numbness and tingling of right leg   . Osteopenia   . PONV (postoperative nausea and vomiting)    pt has n/v and vertigo after anesthesia  . Vertigo    at times, cannot turn on left side quickly or sleep on left side  . Vitamin D deficiency     Past Surgical History:  Procedure Laterality Date  . arthroscopic knee surgery Left 2012  . CARDIAC CATHETERIZATION  2012  . JOINT REPLACEMENT Right 2010  . LUMBAR LAMINECTOMY/DECOMPRESSION MICRODISCECTOMY N/A 10/22/2013   Procedure: MICROLUMBAR DECOMPRESSION LUMBAR TWO TO THREE, LUMBAR THREE TO FOUR and faramenotomy two,threeand four ,five bilateral;  Surgeon: Javier Docker,  MD;  Location: [facility];  Service: Orthopedics;  Laterality: N/A;  . NASAL SINUS SURGERY  yrs ago    x 2  . TOTAL KNEE ARTHROPLASTY Left 03/01/2015   Procedure: LEFT TOTAL KNEE ARTHROPLASTY;  Surgeon: Ollen Gross, MD;  Location: [facility];  Service: Orthopedics;  Laterality: Left;    There were no vitals filed for this visit.      Subjective Assessment - 11/09/16 1216    Subjective  Pt here for OT visit 2 for lymphedema treatment to BLE. Pt is accompanied by her husband, who will assist with compression wrapping.   Patient is accompained by: Family member   Pertinent History d   Limitations BLE pain and swelling causes difficulty walking, difficulty w/ transfers and decreased functional mobility, decreased standing tolerance, decreased basic and instrumental ADLs performance, ( difficulty sleeping, lower body dressing, home management activities, social and community participation, decreased body image 2/2  feet and legsdisfigurement   Repetition Increases Symptoms   Patient Stated Goals decrease pain and leg swelling and keep it down so I can do more; get into a pair of  pretty boots, sleep better, do more volunteering   Currently in Pain? Yes   Pain Score 8    Pain Location Leg   Pain Orientation Right;Left   Pain Onset More than a month ago  LYMPHEDEMA/ONCOLOGY QUESTIONNAIRE - 11/09/16 1219      Right Lower Extremity Lymphedema   Other RLE below knee (A-D) limb volume= 3847.76 ml. Ankle to groin (A-G) volume =11892.77   Other A-D limb volume differential (LVD)= 8.25%, R>L A-G LVD= 13.32%, R>L     Left Lower Extremity Lymphedema   Other LLE below knee (A-D) limb volume= 3530.18 ml. Ankle to groin (A-G) volume =10308.85                 OT Treatments/Exercises (OP) - 11/09/16 0001      ADLs   ADL Education Given Yes     Manual Therapy   Manual Therapy Edema management;Compression Bandaging   Edema Management BLE comparative volumetrics   Compression  Bandaging Ankle to below Knee (A-D) LLE gradient compression wraps applied from toes to below knee as follows: Toe erap with Cowrap 1 and 2" tape under cotton stockinett; 1 rolls 10 cm Rosidal soft circumferentially from foot to tibila tuberosity w/ 50% overlap; single 8 cm x 5 m  to foot and ankle, single 10 cm  x 5 m ankle to achilles origin; single 12 x 5 from mid calf to below knee.[name] Kitchen.all applied circumferentially in custommary layered gradient configuration. Fitted w/ 3 x non-slip sock w/ top band cut to eliminate constriction at ankle.                OT Education - 11/09/16 1224    Education provided Yes   Education Details Continued skilled Pt/caregiver Education  And LE ADL training throughout visit for lymphedema self care, including compression wrapping, compression garment and device wear/care, lymphatic pumping ther ex, simple self-MLD, and skin care. Discussed progress towards goals.   Person(s) Educated Patient;Spouse   Methods Explanation;Demonstration;Handout   Comprehension Verbalized understanding;Need further instruction             OT Long Term Goals - 11/02/16 1205      OT LONG TERM GOAL #1   Title Lymphedema (LE) management/ self-care: Pt able to apply multi layered, gradient compression wraps with MAX caregiver assistance using proper techniques within 2 weeks to achieve optimal limb volume reduction.   Baseline dependent   Time 2   Period Weeks   Status New     OT LONG TERM GOAL #2   Title Lymphedema (LE) management/ self-care:  Pt to achieve at least 10% LLE limb volume reductions bilaterally during Intensive CDT to limit LE progression, decrease infection and falls risk, to reduce pain/,  to improve safe ambulation and functional mobility, and to improve ability to fit preferred LB clothing and street shoes..   Baseline dependent   Time 12   Period Weeks   Status New     OT LONG TERM GOAL #3   Title Lymphedema (LE) management/ self-care:  Pt >/= 85 %  compliant with all daily, LE self-care protocols for home program w/ needed level of caregiver assistance , including simple self-manual lymphatic drainage (MLD), skin care, lymphatic pumping the ex, skin care, and donning/ doffing compression wraps and garments o limit LE progression and further functional decline.     Baseline dependent   Time 12   Period Weeks   Status New     OT LONG TERM GOAL #4   Title Lymphedema (LE) management/ self-care:  Pt to tolerate daily compression wraps, garments and devices in keeping w/ prescribed wear regime within 1 week of issue date to progress and retain clinical and functional gains and to limit LE  progression.   Baseline dependent   Time 12   Period Weeks   Status New     OT LONG TERM GOAL #5   Title Lymphedema (LE) self-care:  During Management Phase CDT Pt to sustain limb volume reductions achieved during Intensive Phase CDT within 5% utilizing LE self-care protocols, appropriate compression garments/ devices, and needed level of caregiver assistance.   Baseline dependent   Time 6   Period Months   Status New               Plan - 11/09/16 1222    Clinical Impression Statement Initial BLE comparative limb volumetrics reveal A-D limb volume differential (LVD)= 8.25%, R>L A-G LVD= 13.32%, R>L at today's baseline. Pt tolerated compression wraps from foot to groin in clinic. She expressed understanding of instructions for building compression tolerance and wear and care recommendations.   Rehab Potential Good   OT Frequency 3x / week   OT Duration 12 weeks   OT Treatment/Interventions Self-care/ADL training;Therapeutic exercise;Functional Mobility Training;Patient/family education;Manual Therapy;Manual lymph drainage;Other (comment);DME and/or AE instruction;Compression bandaging;Therapeutic activities  skin care      Patient will benefit from skilled therapeutic intervention in order to improve the following deficits and impairments:   Abnormal gait, Decreased skin integrity, Decreased knowledge of precautions, Improper body mechanics, Decreased activity tolerance, Decreased knowledge of use of DME, Impaired flexibility, Decreased balance, Decreased mobility, Difficulty walking, Impaired sensation, Decreased range of motion, Increased edema, Pain  Visit Diagnosis: Lymphedema, not elsewhere classified    Problem List Patient Active Problem List   Diagnosis Date Noted  . OA (osteoarthritis) of knee 03/01/2015  . Preoperative cardiovascular examination 01/05/2015  . Spinal stenosis of lumbar region 10/22/2013   Loel Dubonnetheresa Beverlyn Mcginness, MS, OTR/L, [facility] 11/09/16 12:29 PM  [place] [facility] [address] [place], [place], [address] Phone: [phone]   Fax:  [phone]  Name: Rayvon CharMary J Cisse MRN: [id number] Date of Birth: 11/21/1941

## 2016-11-10 ENCOUNTER — Ambulatory Visit: Payer: Medicare Other | Admitting: Occupational Therapy

## 2016-11-10 DIAGNOSIS — I890 Lymphedema, not elsewhere classified: Secondary | ICD-10-CM | POA: Diagnosis not present

## 2016-11-10 NOTE — Therapy (Signed)
[place] [facility]IN [facility] [address] [place], [place], [address] Phone: [phone]   Fax:  [phone]  Occupational Therapy Treatment  Patient Details  Name: Holly Fields MRN: [id number] Date of Birth: 08/28/1941 Referring Provider: childhood, exacerbations 2010 and 2016  Encounter Date: 11/10/2016      OT End of Session - 11/10/16 1528    Visit Number 3   Number of Visits 36   Date for OT Re-Evaluation 01/31/17   OT Start Time 0105   OT Stop Time 0210   OT Time Calculation (min) 65 min   Equipment Utilized During Treatment Lymphedema Management and Self Care Workbook- hard copy provided   Activity Tolerance Patient tolerated treatment well;No increased pain   Behavior During Therapy WFL for tasks assessed/performed      Past Medical History:  Diagnosis Date  . Arthritis   . CKD (chronic kidney disease)     saw dr Lowell Guitarpowell 2 years ago, now released from nephrology  . Complication of anesthesia 2010   "sluggish after anesthesia and had vertigo", slow to awaken after knee artroscopy 2012  . Diabetes (HCC)   . Eczema    at times  . Edema   . GERD (gastroesophageal reflux disease)   . HTN (hypertension)   . Hypercholesteremia   . Lymphedema of leg    right more than RIGHT  . Narrowing of airway 2010  . Numbness and tingling of right leg   . Osteopenia   . PONV (postoperative nausea and vomiting)    pt has n/v and vertigo after anesthesia  . Vertigo    at times, cannot turn on left side quickly or sleep on left side  . Vitamin D deficiency     Past Surgical History:  Procedure Laterality Date  . arthroscopic knee surgery Left 2012  . CARDIAC CATHETERIZATION  2012  . JOINT REPLACEMENT Right 2010  . LUMBAR LAMINECTOMY/DECOMPRESSION MICRODISCECTOMY N/A 10/22/2013   Procedure: MICROLUMBAR DECOMPRESSION LUMBAR TWO TO THREE, LUMBAR THREE TO FOUR and faramenotomy two,threeand four ,five bilateral;  Surgeon: Javier DockerJeffrey C Beane,  MD;  Location: [facility];  Service: Orthopedics;  Laterality: N/A;  . NASAL SINUS SURGERY  yrs ago    x 2  . TOTAL KNEE ARTHROPLASTY Left 03/01/2015   Procedure: LEFT TOTAL KNEE ARTHROPLASTY;  Surgeon: Ollen GrossFrank Aluisio, MD;  Location: [facility];  Service: Orthopedics;  Laterality: Left;    There were no vitals filed for this visit.      Subjective Assessment - 11/10/16 1525    Subjective  Pt here for OT visit 3 for lymphedema treatment to BLE. Pt is accompanied by her husband, who will assist with compression wrapping. Pt reports she tolerated compression wraps since yesterday's visit without difficulty. "I dont feel as achey."   Patient is accompained by: Family member   Pertinent History d   Limitations BLE pain and swelling causes difficulty walking, difficulty w/ transfers and decreased functional mobility, decreased standing tolerance, decreased basic and instrumental ADLs performance, ( difficulty sleeping, lower body dressing, home management activities, social and community participation, decreased body image 2/2  feet and legsdisfigurement   Repetition Increases Symptoms   Patient Stated Goals decrease pain and leg swelling and keep it down so I can do more; get into a pair of  pretty boots, sleep better, do more volunteering   Currently in Pain? Yes  chronic leg and back pain   Pain Onset More than a month ago  LYMPHEDEMA/ONCOLOGY QUESTIONNAIRE - 11/09/16 1219      Right Lower Extremity Lymphedema   Other RLE below knee (A-D) limb volume= 3847.76 ml. Ankle to groin (A-G) volume =11892.77   Other A-D limb volume differential (LVD)= 8.25%, R>L A-G LVD= 13.32%, R>L     Left Lower Extremity Lymphedema   Other LLE below knee (A-D) limb volume= 3530.18 ml. Ankle to groin (A-G) volume =10308.85                 OT Treatments/Exercises (OP) - 11/10/16 0001      ADLs   ADL Education Given Yes     Manual Therapy   Manual Therapy Edema management;Compression  Bandaging   Manual therapy comments skin care with low pH Eucerin lotion                OT Education - 11/10/16 1528    Education provided Yes   Education Details Emphasis of LE self care training on Pt and CG compression    wrapping today.    Person(s) Educated Patient;Spouse   Methods Explanation;Demonstration;Tactile cues;Verbal cues;Handout   Comprehension Verbalized understanding;Returned demonstration;Verbal cues required;Tactile cues required;Need further instruction             OT Long Term Goals - 11/02/16 1205      OT LONG TERM GOAL #1   Title Lymphedema (LE) management/ self-care: Pt able to apply multi layered, gradient compression wraps with MAX caregiver assistance using proper techniques within 2 weeks to achieve optimal limb volume reduction.   Baseline dependent   Time 2   Period Weeks   Status New     OT LONG TERM GOAL #2   Title Lymphedema (LE) management/ self-care:  Pt to achieve at least 10% LLE limb volume reductions bilaterally during Intensive CDT to limit LE progression, decrease infection and falls risk, to reduce pain/,  to improve safe ambulation and functional mobility, and to improve ability to fit preferred LB clothing and street shoes..   Baseline dependent   Time 12   Period Weeks   Status New     OT LONG TERM GOAL #3   Title Lymphedema (LE) management/ self-care:  Pt >/= 85 % compliant with all daily, LE self-care protocols for home program w/ needed level of caregiver assistance , including simple self-manual lymphatic drainage (MLD), skin care, lymphatic pumping the ex, skin care, and donning/ doffing compression wraps and garments o limit LE progression and further functional decline.     Baseline dependent   Time 12   Period Weeks   Status New     OT LONG TERM GOAL #4   Title Lymphedema (LE) management/ self-care:  Pt to tolerate daily compression wraps, garments and devices in keeping w/ prescribed wear regime within 1 week of  issue date to progress and retain clinical and functional gains and to limit LE progression.   Baseline dependent   Time 12   Period Weeks   Status New     OT LONG TERM GOAL #5   Title Lymphedema (LE) self-care:  During Management Phase CDT Pt to sustain limb volume reductions achieved during Intensive Phase CDT within 5% utilizing LE self-care protocols, appropriate compression garments/ devices, and needed level of caregiver assistance.   Baseline dependent   Time 6   Period Months   Status New               Plan - 11/10/16 1529    Clinical Impression Statement By end of session  Pt able to apply compression wraps w/ max CG assist. Spouse able to apply gradient compression wraps using proper techniques w/ Max assist. Good first effort. Cont as per POC.   Rehab Potential Good   OT Frequency 3x / week   OT Duration 12 weeks   OT Treatment/Interventions Self-care/ADL training;Therapeutic exercise;Functional Mobility Training;Patient/family education;Manual Therapy;Manual lymph drainage;Other (comment);DME and/or AE instruction;Compression bandaging;Therapeutic activities  skin care      Patient will benefit from skilled therapeutic intervention in order to improve the following deficits and impairments:  Abnormal gait, Decreased skin integrity, Decreased knowledge of precautions, Improper body mechanics, Decreased activity tolerance, Decreased knowledge of use of DME, Impaired flexibility, Decreased balance, Decreased mobility, Difficulty walking, Impaired sensation, Decreased range of motion, Increased edema, Pain  Visit Diagnosis: Lymphedema, not elsewhere classified    Problem List Patient Active Problem List   Diagnosis Date Noted  . OA (osteoarthritis) of knee 03/01/2015  . Preoperative cardiovascular examination 01/05/2015  . Spinal stenosis of lumbar region 10/22/2013    Loel Dubonnet, MS, OTR/L, [facility] 11/10/16 3:31 PM  [place] [facility] MAIN [facility] SERVICES [address] [place], [place], [address] Phone: [phone]   Fax:  [phone]  Name: Holly Fields MRN: [id number] Date of Birth: 28-Jun-1941

## 2016-11-13 ENCOUNTER — Ambulatory Visit: Payer: Medicare Other | Admitting: Occupational Therapy

## 2016-11-13 DIAGNOSIS — I890 Lymphedema, not elsewhere classified: Secondary | ICD-10-CM | POA: Diagnosis not present

## 2016-11-13 NOTE — Therapy (Signed)
[facility] [address] [place], [place], [address] Phone: [phone]   Fax:  [phone]  Occupational Therapy Treatment  Patient Details  Name: Holly Fields MRN: [id number] Date of Birth: 08/22/1941 Referring Provider: childhood, exacerbations 2010 and 2016  Encounter Date: 11/13/2016      OT End of Session - 11/13/16 1536    Visit Number 4   Number of Visits 36   Date for OT Re-Evaluation 01/31/17   OT Start Time 0110   OT Stop Time 0220   OT Time Calculation (min) 70 min   Equipment Utilized During Treatment Lymphedema Management and Self Care Workbook- hard copy provided   Activity Tolerance Patient tolerated treatment well;No increased pain   Behavior During Therapy WFL for tasks assessed/performed      Past Medical History:  Diagnosis Date  . Arthritis   . CKD (chronic kidney disease)     saw dr Lowell Guitarpowell 2 years ago, now released from nephrology  . Complication of anesthesia 2010   "sluggish after anesthesia and had vertigo", slow to awaken after knee artroscopy 2012  . Diabetes (HCC)   . Eczema    at times  . Edema   . GERD (gastroesophageal reflux disease)   . HTN (hypertension)   . Hypercholesteremia   . Lymphedema of leg    right more than RIGHT  . Narrowing of airway 2010  . Numbness and tingling of right leg   . Osteopenia   . PONV (postoperative nausea and vomiting)    pt has n/v and vertigo after anesthesia  . Vertigo    at times, cannot turn on left side quickly or sleep on left side  . Vitamin D deficiency     Past Surgical History:  Procedure Laterality Date  . arthroscopic knee surgery Left 2012  . CARDIAC CATHETERIZATION  2012  . JOINT REPLACEMENT Right 2010  . LUMBAR LAMINECTOMY/DECOMPRESSION MICRODISCECTOMY N/A 10/22/2013   Procedure: MICROLUMBAR DECOMPRESSION LUMBAR TWO TO THREE, LUMBAR THREE TO FOUR and faramenotomy two,threeand four ,five bilateral;  Surgeon: Javier DockerJeffrey C  Beane, MD;  Location: [facility];  Service: Orthopedics;  Laterality: N/A;  . NASAL SINUS SURGERY  yrs ago    x 2  . TOTAL KNEE ARTHROPLASTY Left 03/01/2015   Procedure: LEFT TOTAL KNEE ARTHROPLASTY;  Surgeon: Ollen GrossFrank Aluisio, MD;  Location: [facility];  Service: Orthopedics;  Laterality: Left;    There were no vitals filed for this visit.      Subjective Assessment - 11/13/16 1530    Subjective  Pt here for OT visit 4 for lymphedema treatment to BLE. Pt is accompanied by her husband, who will assist with compression wrapping. Pt reports she has decreased leg pain and discomfort when walking , transferring, and moving about in bed. since commencing LE rx. Pt reports she had no difficulty tolerating wraps during visit interval, but she feels like her husband needs more practice with banaging.   Patient is accompained by: Family member   Pertinent History d   Limitations BLE pain and swelling causes difficulty walking, difficulty w/ transfers and decreased functional mobility, decreased standing tolerance, decreased basic and instrumental ADLs performance, ( difficulty sleeping, lower body dressing, home management activities, social and community participation, decreased body image 2/2  feet and legsdisfigurement   Repetition Increases Symptoms   Patient Stated Goals decrease pain and leg swelling and keep it down so I can do more; get into a pair of  pretty boots, sleep better,  do more volunteering   Currently in Pain? No/denies   Pain Onset More than a month ago                      OT Treatments/Exercises (OP) - 11/13/16 0001      ADLs   ADL Education Given Yes     Manual Therapy   Manual Therapy Edema management;Compression Bandaging;Manual Lymphatic Drainage (MLD)   Manual therapy comments skin care with low pH Eucerin lotion   Manual Lymphatic Drainage (MLD) Manual lymph drainage (MLD) to RLE in supine utilizing functional inguinal lymph nodes and deep abdominal lymphatics as  is customary for non-cancer related lower extremity LE, including bilateral "short neck" sequence, J strokes to sub and supraclavicular LN, deep abdominal pathways, functional inguinal LN, lower extremity proximal to distal w/ emphasis on medial knee bottleneck and politeal LN. Performed fibrosis technique to B maleoli and distal  legs to address fatty fibrosis. Good tolerance.   Compression Bandaging LLE gradient compression wraps applied circumferentially in gradient configuration from toes to groin as follows: custom toe wrap using 1 and 2" co-wrap under cotton stockinett from toes to groin; 8 cm x 1 to foot and ankle, 10 cm x 1, then 12 cm x 2- all layered over .04 x 10 cm and 12 cm Rosidol Soft foam from A-G.                OT Education - 11/13/16 1535    Education provided Yes   Education Details Con tinued Artistskilled Pt/caregiver Education  And LE ADL training throughout visit for lymphedema self care, including compression wrapping, compression garment and device wear/care, lymphatic pumping ther ex, simple self-MLD, and skin care. Discussed progress towards goals.   Person(s) Educated Patient;Spouse   Methods Explanation;Demonstration   Comprehension Verbalized understanding;Need further instruction             OT Long Term Goals - 11/02/16 1205      OT LONG TERM GOAL #1   Title Lymphedema (LE) management/ self-care: Pt able to apply multi layered, gradient compression wraps with MAX caregiver assistance using proper techniques within 2 weeks to achieve optimal Fields volume reduction.   Baseline dependent   Time 2   Period Weeks   Status New     OT LONG TERM GOAL #2   Title Lymphedema (LE) management/ self-care:  Pt to achieve at least 10% LLE Fields volume reductions bilaterally during Intensive CDT to limit LE progression, decrease infection and falls risk, to reduce pain/,  to improve safe ambulation and functional mobility, and to improve ability to fit preferred LB  clothing and street shoes..   Baseline dependent   Time 12   Period Weeks   Status New     OT LONG TERM GOAL #3   Title Lymphedema (LE) management/ self-care:  Pt >/= 85 % compliant with all daily, LE self-care protocols for home program w/ needed level of caregiver assistance , including simple self-manual lymphatic drainage (MLD), skin care, lymphatic pumping the ex, skin care, and donning/ doffing compression wraps and garments o limit LE progression and further functional decline.     Baseline dependent   Time 12   Period Weeks   Status New     OT LONG TERM GOAL #4   Title Lymphedema (LE) management/ self-care:  Pt to tolerate daily compression wraps, garments and devices in keeping w/ prescribed wear regime within 1 week of issue date to progress and retain clinical  and functional gains and to limit LE progression.   Baseline dependent   Time 12   Period Weeks   Status New     OT LONG TERM GOAL #5   Title Lymphedema (LE) self-care:  During Management Phase CDT Pt to sustain Fields volume reductions achieved during Intensive Phase CDT within 5% utilizing LE self-care protocols, appropriate compression garments/ devices, and needed level of caregiver assistance.   Baseline dependent   Time 6   Period Months   Status New               Plan - 11/13/16 1537    Clinical Impression Statement Mild decrease in RLE swelling achieved so far. Pt reporting decreased leg pain with compression. She is tolerating without difficulty. Spouse and Pt are learning to apply wraps and did so over the weekend, but need more practice in clinic. Cont as per POC. Steady progress towards goals noted.   Rehab Potential Good   OT Frequency 3x / week   OT Duration 12 weeks   OT Treatment/Interventions Self-care/ADL training;Therapeutic exercise;Functional Mobility Training;Patient/family education;Manual Therapy;Manual lymph drainage;Other (comment);DME and/or AE instruction;Compression  bandaging;Therapeutic activities  skin care      Patient will benefit from skilled therapeutic intervention in order to improve the following deficits and impairments:  Abnormal gait, Decreased skin integrity, Decreased knowledge of precautions, Improper body mechanics, Decreased activity tolerance, Decreased knowledge of use of DME, Impaired flexibility, Decreased balance, Decreased mobility, Difficulty walking, Impaired sensation, Decreased range of motion, Increased edema, Pain  Visit Diagnosis: Lymphedema, not elsewhere classified    Problem List Patient Active Problem List   Diagnosis Date Noted  . OA (osteoarthritis) of knee 03/01/2015  . Preoperative cardiovascular examination 01/05/2015  . Spinal stenosis of lumbar region 10/22/2013    Loel Dubonnet, MS, OTR/L, [facility] 11/13/16 3:40 PM  [facility] MAIN [facility] SERVICES [address] [place], [place], [address] Phone: [phone]   Fax:  [phone]  Name: Holly Fields MRN: [id number] Date of Birth: 1941-11-02

## 2016-11-13 NOTE — Patient Instructions (Signed)
LE instructions and precautions as established- see initial eval.   

## 2016-11-15 ENCOUNTER — Ambulatory Visit: Payer: Medicare Other | Admitting: Occupational Therapy

## 2016-11-15 DIAGNOSIS — I890 Lymphedema, not elsewhere classified: Secondary | ICD-10-CM

## 2016-11-15 NOTE — Patient Instructions (Signed)
LE instructions and precautions as established- see initial eval.   

## 2016-11-15 NOTE — Therapy (Signed)
[facility] [address] [place], [place], [address] Phone: [phone]   Fax:  [phone]  Occupational Therapy Treatment  Patient Details  Name: Holly [name] MRN: [id number] Date of Birth: 04/08/1941 Referring Provider: childhood, exacerbations 2010 and 2016  Encounter Date: 11/15/2016      OT End of Session - 11/15/16 1544    Visit Number 5   Number of Visits 36   Date for OT Re-Evaluation 01/31/17   OT Start Time 0105   OT Stop Time 0210   OT Time Calculation (min) 65 min   Equipment Utilized During Treatment Lymphedema Management and Self Care Workbook- hard copy provided   Activity Tolerance Patient tolerated treatment well;No increased pain   Behavior During Therapy WFL for tasks assessed/performed      Past Medical History:  Diagnosis Date  . Arthritis   . CKD (chronic kidney disease)     saw dr Lowell Guitarpowell 2 years ago, now released from nephrology  . Complication of anesthesia 2010   "sluggish after anesthesia and had vertigo", slow to awaken after knee artroscopy 2012  . Diabetes (HCC)   . Eczema    at times  . Edema   . GERD (gastroesophageal reflux disease)   . HTN (hypertension)   . Hypercholesteremia   . Lymphedema of leg    right more than RIGHT  . Narrowing of airway 2010  . Numbness and tingling of right leg   . Osteopenia   . PONV (postoperative nausea and vomiting)    pt has n/v and vertigo after anesthesia  . Vertigo    at times, cannot turn on left side quickly or sleep on left side  . Vitamin D deficiency     Past Surgical History:  Procedure Laterality Date  . arthroscopic knee surgery Left 2012  . CARDIAC CATHETERIZATION  2012  . JOINT REPLACEMENT Right 2010  . LUMBAR LAMINECTOMY/DECOMPRESSION MICRODISCECTOMY N/A 10/22/2013   Procedure: MICROLUMBAR DECOMPRESSION LUMBAR TWO TO THREE, LUMBAR THREE TO FOUR and faramenotomy two,threeand four ,five bilateral;  Surgeon: Javier DockerJeffrey C  Beane, MD;  Location: [facility];  Service: Orthopedics;  Laterality: N/A;  . NASAL SINUS SURGERY  yrs ago    x 2  . TOTAL KNEE ARTHROPLASTY Left 03/01/2015   Procedure: LEFT TOTAL KNEE ARTHROPLASTY;  Surgeon: Ollen GrossFrank Aluisio, MD;  Location: [facility];  Service: Orthopedics;  Laterality: Left;    There were no vitals filed for this visit.      Subjective Assessment - 11/15/16 1541    Subjective  Pt here for OT visit 5 for lymphedema treatment to BLE. Pt is accompanied by her husband. Pt reports she had no difficulty tolerating compression wraps between sessions. My husband needs to practice more."   Patient is accompained by: Family member   Pertinent History d   Limitations BLE pain and swelling causes difficulty walking, difficulty w/ transfers and decreased functional mobility, decreased standing tolerance, decreased basic and instrumental ADLs performance, ( difficulty sleeping, lower body dressing, home management activities, social and community participation, decreased body image 2/2  feet and legsdisfigurement   Repetition Increases Symptoms   Patient Stated Goals decrease pain and leg swelling and keep it down so I can do more; get into a pair of  pretty boots, sleep better, do more volunteering   Currently in Pain? No/denies   Pain Onset More than a month ago  OT Treatments/Exercises (OP) - 11/15/16 0001      ADLs   ADL Education Given Yes     Manual Therapy   Manual Therapy Edema management;Compression Bandaging;Manual Lymphatic Drainage (MLD)   Manual therapy comments skin care with low pH Eucerin lotion   Manual Lymphatic Drainage (MLD) Manual lymph drainage (MLD) to RLE in supine utilizing functional inguinal lymph nodes and deep abdominal lymphatics as is customary for non-cancer related lower extremity LE, including bilateral "short neck" sequence, J strokes to sub and supraclavicular LN, deep abdominal pathways, functional inguinal LN, lower  extremity proximal to distal w/ emphasis on medial knee bottleneck and politeal LN. Performed fibrosis technique to B maleoli and distal  legs to address fatty fibrosis. Good tolerance.   Compression Bandaging LLE gradient compression wraps applied circumferentially in gradient configuration from toes to groin as follows: custom toe wrap using 1 and 2" co-wrap under cotton stockinett from toes to groin; 8 cm x 1 to foot and ankle, 10 cm x 1, then 12 cm x 2- all layered over .04 x 10 cm and 12 cm Rosidol Soft foam from A-G.                OT Education - 11/15/16 1543    Education provided Yes   Education Details Continues skilled Pt and CG edu for lymphedema compression wrapping.             OT Long Term Goals - 11/02/16 1205      OT LONG TERM GOAL #1   Title Lymphedema (LE) management/ self-care: Pt able to apply multi layered, gradient compression wraps with MAX caregiver assistance using proper techniques within 2 weeks to achieve optimal limb volume reduction.   Baseline dependent   Time 2   Period Weeks   Status New     OT LONG TERM GOAL #2   Title Lymphedema (LE) management/ self-care:  Pt to achieve at least 10% LLE limb volume reductions bilaterally during Intensive CDT to limit LE progression, decrease infection and falls risk, to reduce pain/,  to improve safe ambulation and functional mobility, and to improve ability to fit preferred LB clothing and street shoes..   Baseline dependent   Time 12   Period Weeks   Status New     OT LONG TERM GOAL #3   Title Lymphedema (LE) management/ self-care:  Pt >/= 85 % compliant with all daily, LE self-care protocols for home program w/ needed level of caregiver assistance , including simple self-manual lymphatic drainage (MLD), skin care, lymphatic pumping the ex, skin care, and donning/ doffing compression wraps and garments o limit LE progression and further functional decline.     Baseline dependent   Time 12   Period  Weeks   Status New     OT LONG TERM GOAL #4   Title Lymphedema (LE) management/ self-care:  Pt to tolerate daily compression wraps, garments and devices in keeping w/ prescribed wear regime within 1 week of issue date to progress and retain clinical and functional gains and to limit LE progression.   Baseline dependent   Time 12   Period Weeks   Status New     OT LONG TERM GOAL #5   Title Lymphedema (LE) self-care:  During Management Phase CDT Pt to sustain limb volume reductions achieved during Intensive Phase CDT within 5% utilizing LE self-care protocols, appropriate compression garments/ devices, and needed level of caregiver assistance.   Baseline dependent   Time 6   Period  Months   Status New               Plan - 11/15/16 1545    Clinical Impression Statement By end of session spouse able to apply wraps w/ mod assist, including intermittent verbal and physical cues and demonstration. Pt able to take a more active directional roll. Limb volume responding slowly to Rx, but tissue density in R leg is moderately decreased by palpation since commencing CDT. Foot fibrosis and swelling are very stubborn to date with minimal change observed.   Rehab Potential Good   OT Frequency 3x / week   OT Duration 12 weeks   OT Treatment/Interventions Self-care/ADL training;Therapeutic exercise;Functional Mobility Training;Patient/family education;Manual Therapy;Manual lymph drainage;Other (comment);DME and/or AE instruction;Compression bandaging;Therapeutic activities  skin care      Patient will benefit from skilled therapeutic intervention in order to improve the following deficits and impairments:  Abnormal gait, Decreased skin integrity, Decreased knowledge of precautions, Improper body mechanics, Decreased activity tolerance, Decreased knowledge of use of DME, Impaired flexibility, Decreased balance, Decreased mobility, Difficulty walking, Impaired sensation, Decreased range of motion,  Increased edema, Pain  Visit Diagnosis: Lymphedema, not elsewhere classified    Problem List Patient Active Problem List   Diagnosis Date Noted  . OA (osteoarthritis) of knee 03/01/2015  . Preoperative cardiovascular examination 01/05/2015  . Spinal stenosis of lumbar region 10/22/2013   Loel Dubonnet, MS, OTR/L, [facility] 11/15/16 3:49 PM   [place] [facility] MAIN [facility] SERVICES [address] [place], [place], [address] Phone: [phone]   Fax:  [phone]  Name: Holly Fields MRN: [id number] Date of Birth: [birth year]

## 2016-11-17 ENCOUNTER — Ambulatory Visit: Payer: Medicare Other | Admitting: Occupational Therapy

## 2016-11-17 DIAGNOSIS — I890 Lymphedema, not elsewhere classified: Secondary | ICD-10-CM

## 2016-11-17 NOTE — Therapy (Signed)
[facility] [address] [place], [place], [address] Phone: [phone]   Fax:  [phone]  Occupational Therapy Treatment  Patient Details  Name: Holly [name] MRN: [id number] Date of Birth: 07/10/1941 Referring Provider: childhood, exacerbations 2010 and 2016  Encounter Date: 11/17/2016      OT End of Session - 11/17/16 1409    Visit Number 5   Number of Visits 36   Date for OT Re-Evaluation 01/31/17   OT Start Time 0103   OT Stop Time 0209   OT Time Calculation (min) 66 min   Equipment Utilized During Treatment Lymphedema Management and Self Care Workbook- hard copy provided   Activity Tolerance Patient tolerated treatment well;No increased pain   Behavior During Therapy WFL for tasks assessed/performed      Past Medical History:  Diagnosis Date  . Arthritis   . CKD (chronic kidney disease)     saw dr Lowell Guitarpowell 2 years ago, now released from nephrology  . Complication of anesthesia 2010   "sluggish after anesthesia and had vertigo", slow to awaken after knee artroscopy 2012  . Diabetes (HCC)   . Eczema    at times  . Edema   . GERD (gastroesophageal reflux disease)   . HTN (hypertension)   . Hypercholesteremia   . Lymphedema of leg    right more than RIGHT  . Narrowing of airway 2010  . Numbness and tingling of right leg   . Osteopenia   . PONV (postoperative nausea and vomiting)    pt has n/v and vertigo after anesthesia  . Vertigo    at times, cannot turn on left side quickly or sleep on left side  . Vitamin D deficiency     Past Surgical History:  Procedure Laterality Date  . arthroscopic knee surgery Left 2012  . CARDIAC CATHETERIZATION  2012  . JOINT REPLACEMENT Right 2010  . LUMBAR LAMINECTOMY/DECOMPRESSION MICRODISCECTOMY N/A 10/22/2013   Procedure: MICROLUMBAR DECOMPRESSION LUMBAR TWO TO THREE, LUMBAR THREE TO FOUR and faramenotomy two,threeand four ,five bilateral;  Surgeon: Javier DockerJeffrey C  Beane, MD;  Location: [facility];  Service: Orthopedics;  Laterality: N/A;  . NASAL SINUS SURGERY  yrs ago    x 2  . TOTAL KNEE ARTHROPLASTY Left 03/01/2015   Procedure: LEFT TOTAL KNEE ARTHROPLASTY;  Surgeon: Ollen GrossFrank Aluisio, MD;  Location: [facility];  Service: Orthopedics;  Laterality: Left;    There were no vitals filed for this visit.      Subjective Assessment - 11/17/16 1310    Subjective  Pt here for OT visit 5 for lymphedema treatment to BLE. Pt is accompanied by her husband. Pt has no new complaints.   Patient is accompained by: Family member   Pertinent History --   Limitations BLE pain and swelling causes difficulty walking, difficulty w/ transfers and decreased functional mobility, decreased standing tolerance, decreased basic and instrumental ADLs performance, ( difficulty sleeping, lower body dressing, home management activities, social and community participation, decreased body image 2/2  feet and legsdisfigurement   Repetition Increases Symptoms   Patient Stated Goals decrease pain and leg swelling and keep it down so I can do more; get into a pair of  pretty boots, sleep better, do more volunteering   Currently in Pain? No/denies   Pain Onset More than a month ago                      OT Treatments/Exercises (OP) - 11/17/16 0001  ADLs   ADL Education Given Yes     Manual Therapy   Manual Therapy Edema management;Compression Bandaging;Manual Lymphatic Drainage (MLD)   Manual therapy comments skin care with low pH Eucerin lotion   Manual Lymphatic Drainage (MLD) Manual lymph drainage (MLD) to RLE in supine utilizing functional inguinal lymph nodes and deep abdominal lymphatics as is customary for non-cancer related lower extremity LE, including bilateral "short neck" sequence, J strokes to sub and supraclavicular LN, deep abdominal pathways, functional inguinal LN, lower extremity proximal to distal w/ emphasis on medial knee bottleneck and politeal LN.  Performed fibrosis technique to B maleoli and distal  legs to address fatty fibrosis. Good tolerance.   Compression Bandaging LLE gradient compression wraps applied circumferentially in gradient configuration from toes to groin as follows: custom toe wrap using 1 and 2" co-wrap under cotton stockinett from toes to groin; 8 cm x 1 to foot and ankle, 10 cm x 1, then 12 cm x 2- all layered over .04 x 10 cm and 12 cm Rosidol Soft foam from A-G.                OT Education - 11/17/16 1405    Education provided Yes   Education Details Emphasis on teaching compression wraps again today   Person(s) Educated Patient;Spouse   Methods Explanation;Demonstration;Tactile cues;Verbal cues;Handout   Comprehension Verbalized understanding;Need further instruction             OT Long Term Goals - 11/02/16 1205      OT LONG TERM GOAL #1   Title Lymphedema (LE) management/ self-care: Pt able to apply multi layered, gradient compression wraps with MAX caregiver assistance using proper techniques within 2 weeks to achieve optimal limb volume reduction.   Baseline dependent   Time 2   Period Weeks   Status New     OT LONG TERM GOAL #2   Title Lymphedema (LE) management/ self-care:  Pt to achieve at least 10% LLE limb volume reductions bilaterally during Intensive CDT to limit LE progression, decrease infection and falls risk, to reduce pain/,  to improve safe ambulation and functional mobility, and to improve ability to fit preferred LB clothing and street shoes..   Baseline dependent   Time 12   Period Weeks   Status New     OT LONG TERM GOAL #3   Title Lymphedema (LE) management/ self-care:  Pt >/= 85 % compliant with all daily, LE self-care protocols for home program w/ needed level of caregiver assistance , including simple self-manual lymphatic drainage (MLD), skin care, lymphatic pumping the ex, skin care, and donning/ doffing compression wraps and garments o limit LE progression and  further functional decline.     Baseline dependent   Time 12   Period Weeks   Status New     OT LONG TERM GOAL #4   Title Lymphedema (LE) management/ self-care:  Pt to tolerate daily compression wraps, garments and devices in keeping w/ prescribed wear regime within 1 week of issue date to progress and retain clinical and functional gains and to limit LE progression.   Baseline dependent   Time 12   Period Weeks   Status New     OT LONG TERM GOAL #5   Title Lymphedema (LE) self-care:  During Management Phase CDT Pt to sustain limb volume reductions achieved during Intensive Phase CDT within 5% utilizing LE self-care protocols, appropriate compression garments/ devices, and needed level of caregiver assistance.   Baseline dependent   Time  6   Period Months   Status New               Plan - 11/17/16 1544    Clinical Impression Statement Emphasis of visit today on MLd and Pt/caregiver edu for compression wrapping. Pt tolerated MLD without difficulty. By end of session spouse was able to apply wraps with mod assist again today, including visual, verble and tactile cues. Cont as per POC.   Rehab Potential Good   OT Frequency 3x / week   OT Duration 12 weeks   OT Treatment/Interventions Self-care/ADL training;Therapeutic exercise;Functional Mobility Training;Patient/family education;Manual Therapy;Manual lymph drainage;Other (comment);DME and/or AE instruction;Compression bandaging;Therapeutic activities  skin care      Patient will benefit from skilled therapeutic intervention in order to improve the following deficits and impairments:  Abnormal gait, Decreased skin integrity, Decreased knowledge of precautions, Improper body mechanics, Decreased activity tolerance, Decreased knowledge of use of DME, Impaired flexibility, Decreased balance, Decreased mobility, Difficulty walking, Impaired sensation, Decreased range of motion, Increased edema, Pain  Visit Diagnosis: Lymphedema,  not elsewhere classified    Problem List Patient Active Problem List   Diagnosis Date Noted  . OA (osteoarthritis) of knee 03/01/2015  . Preoperative cardiovascular examination 01/05/2015  . Spinal stenosis of lumbar region 10/22/2013    Loel Dubonnet, MS, OTR/L, [facility] 11/17/16 3:48 PM    [facility] MAIN [facility] SERVICES [address] [place], [place], [address] Phone: [phone]   Fax:  [phone]  Name: Holly Fields MRN: [id number] Date of Birth: 1941-11-08

## 2016-11-20 ENCOUNTER — Ambulatory Visit: Payer: Medicare Other | Admitting: Occupational Therapy

## 2016-11-20 DIAGNOSIS — I890 Lymphedema, not elsewhere classified: Secondary | ICD-10-CM | POA: Diagnosis not present

## 2016-11-20 NOTE — Therapy (Signed)
[place] MAIN [facility] SERVICES [address] [place], [place], [address] Phone: [phone]   Fax:  [phone]  Occupational Therapy Treatment  Patient Details  Name: Holly Fields MRN: [id number] Date of Birth: 1941-10-19 Referring Provider: childhood, exacerbations 2010 and 2016  Encounter Date: 11/20/2016      OT End of Session - 11/20/16 1523    Visit Number 6   Number of Visits 36   Date for OT Re-Evaluation 01/31/17   OT Start Time 0103   OT Stop Time 0209   OT Time Calculation (min) 66 min   Equipment Utilized During Treatment Lymphedema Management and Self Care Workbook- hard copy provided   Activity Tolerance Patient tolerated treatment well;No increased pain   Behavior During Therapy WFL for tasks assessed/performed      Past Medical History:  Diagnosis Date  . Arthritis   . CKD (chronic kidney disease)     saw dr Florene Glen 2 years ago, now released from nephrology  . Complication of anesthesia 2010   "sluggish after anesthesia and had vertigo", slow to awaken after knee artroscopy 2012  . [place]   . Eczema    at times  . Edema   . GERD (gastroesophageal reflux disease)   . HTN (hypertension)   . Hypercholesteremia   . Lymphedema of leg    right more than RIGHT  . Narrowing of airway 2010  . Numbness and tingling of right leg   . Osteopenia   . PONV (postoperative nausea and vomiting)    pt has n/v and vertigo after anesthesia  . Vertigo    at times, cannot turn on left side quickly or sleep on left side  . Vitamin D deficiency     Past Surgical History:  Procedure Laterality Date  . arthroscopic knee surgery Left 2012  . CARDIAC CATHETERIZATION  2012  . JOINT REPLACEMENT Right 2010  . LUMBAR LAMINECTOMY/DECOMPRESSION MICRODISCECTOMY N/A 10/22/2013   Procedure: MICROLUMBAR DECOMPRESSION LUMBAR TWO TO THREE, LUMBAR THREE TO FOUR and faramenotomy two,threeand four ,five bilateral;  Surgeon: Johnn Hai, MD;  Location: [facility];  Service: Orthopedics;  Laterality: N/A;  . NASAL SINUS SURGERY  yrs ago    x 2  . TOTAL KNEE ARTHROPLASTY Left 03/01/2015   Procedure: LEFT TOTAL KNEE ARTHROPLASTY;  Surgeon: Gaynelle Arabian, MD;  Location: [facility];  Service: Orthopedics;  Laterality: Left;    There were no vitals filed for this visit.      Subjective Assessment - 11/20/16 1519    Subjective  Pt presents for OT visit 6 for CDT to BLE w/ emphasis on RLE. Pt reports R leg is achy from medial groin to knee.    Limitations BLE pain and swelling causes difficulty walking, difficulty w/ transfers and decreased functional mobility, decreased standing tolerance, decreased basic and instrumental ADLs performance, ( difficulty sleeping, lower body dressing, home management activities, social and community participation, decreased body image 2/2  feet and legsdisfigurement   Patient Stated Goals decrease pain and leg swelling and keep it down so I can do more; get into a pair of  pretty boots, sleep better, do more volunteering   Currently in Pain? Yes  R Leg- not rated- deep ache   Pain Type Chronic pain             LYMPHEDEMA/ONCOLOGY QUESTIONNAIRE - 11/20/16 1534      Right Lower Extremity Lymphedema   Other RLE below knee (A-D) limb volume= 3513.44 ml.  Ankle to groin (A-G) volume =10680.30 ml.   Other RLE A-D limb volume is decreased by 8.69%. RLE A-G limb volume is decreased by 10.20%. Ten % reduction GOAL MET.   Other A-D limb volume differential (LVD)= 8.25%, R>L A-G LVD= 13.32%, R>L                 OT Treatments/Exercises (OP) - 11/20/16 0001      ADLs   ADL Education Given Yes     Manual Therapy   Manual Therapy Edema management;Compression Bandaging;Manual Lymphatic Drainage (MLD)   Manual therapy comments skin care with low pH Eucerin lotion   Manual Lymphatic Drainage (MLD) Manual lymph drainage (MLD) to RLE in supine utilizing functional inguinal lymph nodes and deep  abdominal lymphatics as is customary for non-cancer related lower extremity LE, including bilateral "short neck" sequence, J strokes to sub and supraclavicular LN, deep abdominal pathways, functional inguinal LN, lower extremity proximal to distal w/ emphasis on medial knee bottleneck and politeal LN. Performed fibrosis technique to B maleoli and distal  legs to address fatty fibrosis. Good tolerance.   Compression Bandaging RLE gradient compression wraps applied circumferentially in gradient configuration from toes to groin as follows: custom toe wrap using 1 and 2" co-wrap under cotton stockinett from toes to groin; 8 cm x 1 to foot and ankle, 10 cm x 1, then 12 cm x 2- all layered over .04 x 10 cm and 12 cm Rosidol Soft foam from A-G.. Added custom fabricated foam pad to dorsal foot ibeneath wraps and on top of stockinette  in an effort to facilitate increased compression on back of foot.                OT Education - 11/20/16 1522    Education Details Cont skilled Pt and spouse edu for LE self care protocols, including simple self- MLD, compression, skin care,  and ther ex, as established at initial visit   Person(s) Educated Patient;Spouse   Methods Explanation;Demonstration   Comprehension Verbalized understanding             OT Long Term Goals - 11/02/16 1205      OT LONG TERM GOAL #1   Title Lymphedema (LE) management/ self-care: Pt able to apply multi layered, gradient compression wraps with MAX caregiver assistance using proper techniques within 2 weeks to achieve optimal limb volume reduction.   Baseline dependent   Time 2   Period Weeks   Status New     OT LONG TERM GOAL #2   Title Lymphedema (LE) management/ self-care:  Pt to achieve at least 10% LLE limb volume reductions bilaterally during Intensive CDT to limit LE progression, decrease infection and falls risk, to reduce pain/,  to improve safe ambulation and functional mobility, and to improve ability to fit  preferred LB clothing and street shoes..   Baseline dependent   Time 12   Period Weeks   Status New     OT LONG TERM GOAL #3   Title Lymphedema (LE) management/ self-care:  Pt >/= 85 % compliant with all daily, LE self-care protocols for home program w/ needed level of caregiver assistance , including simple self-manual lymphatic drainage (MLD), skin care, lymphatic pumping the ex, skin care, and donning/ doffing compression wraps and garments o limit LE progression and further functional decline.     Baseline dependent   Time 12   Period Weeks   Status New     OT LONG TERM GOAL #4  Title Lymphedema (LE) management/ self-care:  Pt to tolerate daily compression wraps, garments and devices in keeping w/ prescribed wear regime within 1 week of issue date to progress and retain clinical and functional gains and to limit LE progression.   Baseline dependent   Time 12   Period Weeks   Status New     OT LONG TERM GOAL #5   Title Lymphedema (LE) self-care:  During Management Phase CDT Pt to sustain limb volume reductions achieved during Intensive Phase CDT within 5% utilizing LE self-care protocols, appropriate compression garments/ devices, and needed level of caregiver assistance.   Baseline dependent   Time 6   Period Months   Status New               Plan - 11/20/16 1526    Clinical Impression Statement Completed RLE comparative volumetrics, which reveal limb volume below the knee (A-D) is decreased by 8.69%, and ankle to groin is decreased by 10.2%. The A-G value meets the initial limb volume reduction goal, and Pt is in agreement to increase this goal to 15% for the RLE.  When assessing wraps applied at home yesterday, noted that Spouse mistakenly applied additional layers of foam on foot. Pthad no complaints and swelling is notably decreased in that area today. Pt in agreement with trial with custom fabricated  dorsal foam foot pad  under compression wraps. Pt instructed to  remove this if skin irritation or pain/ discomfort increases.   Rehab Potential Good   OT Frequency 3x / week   OT Duration 12 weeks   OT Treatment/Interventions Self-care/ADL training;Therapeutic exercise;Functional Mobility Training;Patient/family education;Manual Therapy;Manual lymph drainage;Other (comment);DME and/or AE instruction;Compression bandaging;Therapeutic activities  skin care      Patient will benefit from skilled therapeutic intervention in order to improve the following deficits and impairments:  Abnormal gait, Decreased skin integrity, Decreased knowledge of precautions, Improper body mechanics, Decreased activity tolerance, Decreased knowledge of use of DME, Impaired flexibility, Decreased balance, Decreased mobility, Difficulty walking, Impaired sensation, Decreased range of motion, Increased edema, Pain  Visit Diagnosis: Lymphedema, not elsewhere classified    Problem List Patient Active Problem List   Diagnosis Date Noted  . OA (osteoarthritis) of knee 03/01/2015  . Preoperative cardiovascular examination 01/05/2015  . Spinal stenosis of lumbar region 10/22/2013    Andrey Spearman, MS, OTR/L, [facility] 11/20/16 3:38 PM  [place] MAIN [facility] SERVICES [address] [place], [place], [address] Phone: [phone]   Fax:  [phone]  Name: Holly Fields MRN: [id number] Date of Birth: 04-20-1941

## 2016-11-20 NOTE — Patient Instructions (Signed)
LE instructions and precautions as established- see initial eval.   

## 2016-11-22 ENCOUNTER — Ambulatory Visit: Payer: Medicare Other | Admitting: Occupational Therapy

## 2016-11-22 DIAGNOSIS — I890 Lymphedema, not elsewhere classified: Secondary | ICD-10-CM | POA: Diagnosis not present

## 2016-11-23 NOTE — Therapy (Signed)
[facility] MAIN [facility] [address] [place], [place], [address] Phone: [phone]   Fax:  [phone]  Occupational Therapy Treatment  Patient Details  Name: Holly [name] MRN: [id number] Date of Birth: 09/30/1941 Referring Provider: childhood, exacerbations 2010 and 2016  Encounter Date: 11/22/2016      OT End of Session - 11/23/16 1322    Visit Number 7   Number of Visits 36   Date for OT Re-Evaluation 01/31/17   OT Start Time 0105   OT Stop Time 0210   OT Time Calculation (min) 65 min   Equipment Utilized During Treatment Lymphedema Management and Self Care Workbook- hard copy provided   Activity Tolerance Patient tolerated treatment well;No increased pain   Behavior During Therapy WFL for tasks assessed/performed      Past Medical History:  Diagnosis Date  . Arthritis   . CKD (chronic kidney disease)     saw dr Lowell Guitarpowell 2 years ago, now released from nephrology  . Complication of anesthesia 2010   "sluggish after anesthesia and had vertigo", slow to awaken after knee artroscopy 2012  . Diabetes (HCC)   . Eczema    at times  . Edema   . GERD (gastroesophageal reflux disease)   . HTN (hypertension)   . Hypercholesteremia   . Lymphedema of leg    right more than RIGHT  . Narrowing of airway 2010  . Numbness and tingling of right leg   . Osteopenia   . PONV (postoperative nausea and vomiting)    pt has n/v and vertigo after anesthesia  . Vertigo    at times, cannot turn on left side quickly or sleep on left side  . Vitamin D deficiency     Past Surgical History:  Procedure Laterality Date  . arthroscopic knee surgery Left 2012  . CARDIAC CATHETERIZATION  2012  . JOINT REPLACEMENT Right 2010  . LUMBAR LAMINECTOMY/DECOMPRESSION MICRODISCECTOMY N/A 10/22/2013   Procedure: MICROLUMBAR DECOMPRESSION LUMBAR TWO TO THREE, LUMBAR THREE TO FOUR and faramenotomy two,threeand four ,five bilateral;  Surgeon: Javier DockerJeffrey C  Beane, MD;  Location: [facility];  Service: Orthopedics;  Laterality: N/A;  . NASAL SINUS SURGERY  yrs ago    x 2  . TOTAL KNEE ARTHROPLASTY Left 03/01/2015   Procedure: LEFT TOTAL KNEE ARTHROPLASTY;  Surgeon: Ollen GrossFrank Aluisio, MD;  Location: [facility];  Service: Orthopedics;  Laterality: Left;    There were no vitals filed for this visit.      Subjective Assessment - 11/22/16 1317    Subjective  Pt presents for OT visit 7 for CDT to BLE w/ emphasis on RLE.  requests time for caregiver edu for compression wrapping during session today.   Limitations BLE pain and swelling causes difficulty walking, difficulty w/ transfers and decreased functional mobility, decreased standing tolerance, decreased basic and instrumental ADLs performance, ( difficulty sleeping, lower body dressing, home management activities, social and community participation, decreased body image 2/2  feet and legsdisfigurement   Patient Stated Goals decrease pain and leg swelling and keep it down so I can do more; get into a pair of  pretty boots, sleep better, do more volunteering   Currently in Pain? No/denies                      OT Treatments/Exercises (OP) - 11/23/16 0001      ADLs   ADL Education Given Yes     Manual Therapy   Manual Therapy Edema  management;Compression Bandaging;Manual Lymphatic Drainage (MLD)   Manual therapy comments skin care with low pH Eucerin lotion   Manual Lymphatic Drainage (MLD) Manual lymph drainage (MLD) to RLE in supine utilizing functional inguinal lymph nodes and deep abdominal lymphatics as is customary for non-cancer related lower extremity LE, including bilateral "short neck" sequence, J strokes to sub and supraclavicular LN, deep abdominal pathways, functional inguinal LN, lower extremity proximal to distal w/ emphasis on medial knee bottleneck and politeal LN. Performed fibrosis technique to B maleoli and distal  legs to address fatty fibrosis. Good tolerance.   Compression  Bandaging RLE gradient compression wraps applied circumferentially in gradient configuration from toes to groin as follows: custom toe wrap using 1 and 2" co-wrap under cotton stockinett from toes to groin; 8 cm x 1 to foot and ankle, 10 cm x 1, then 12 cm x 2- all layered over .04 x 10 cm and 12 cm Rosidol Soft foam from A-G.. 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                OT Education - 11/22/16 1321    Education Details Continued caregiver and Pt edu for compression wrapping today. Troubleshooting problems w/ Pt.   Person(s) Educated Patient;Spouse   Methods Explanation;Demonstration;Tactile cues;Verbal cues;Handout   Comprehension Verbalized understanding;Returned demonstration;Verbal cues required;Tactile cues required;Need further instruction             OT Long Term Goals - 11/02/16 1205      OT LONG TERM GOAL #1   Title Lymphedema (LE) management/ self-care: Pt able to apply multi layered, gradient compression wraps with MAX caregiver assistance using proper techniques within 2 weeks to achieve optimal limb volume reduction.   Baseline dependent   Time 2   Period Weeks   Status New     OT LONG TERM GOAL #2   Title Lymphedema (LE) management/ self-care:  Pt to achieve at least 10% LLE limb volume reductions bilaterally during Intensive CDT to limit LE progression, decrease infection and falls risk, to reduce pain/,  to improve safe ambulation and functional mobility, and to improve ability to fit preferred LB clothing and street shoes..   Baseline dependent   Time 12   Period Weeks   Status New     OT LONG TERM GOAL #3   Title Lymphedema (LE) management/ self-care:  Pt >/= 85 % compliant with all daily, LE self-care protocols for home program w/ needed level of caregiver assistance , including simple self-manual lymphatic  drainage (MLD), skin care, lymphatic pumping the ex, skin care, and donning/ doffing compression wraps and garments o limit LE progression and further functional decline.     Baseline dependent   Time 12   Period Weeks   Status New     OT LONG TERM GOAL #4   Title Lymphedema (LE) management/ self-care:  Pt to tolerate daily compression wraps, garments and devices in keeping w/ prescribed wear regime within 1 week of issue date to progress and retain clinical and functional gains and to limit LE progression.   Baseline dependent   Time 12   Period Weeks   Status New     OT LONG TERM GOAL #5   Title Lymphedema (LE) self-care:  During Management Phase CDT Pt to sustain limb volume reductions achieved during Intensive Phase CDT within 5% utilizing LE self-care  protocols, appropriate compression garments/ devices, and needed level of caregiver assistance.   Baseline dependent   Time 6   Period Months   Status New               Plan - 11/23/16 1322    Clinical Impression Statement Pt's spouse continues to have difficulty w/ bandage application. After trouble shooting problems w/ him he worked on increasing tension on wraps and on equalizing fabric distributuion. After MLD replaced dorsal foot pad fbricated last session with custom "T" style pad  covering dorsal foot and extending over ankle crease to distal leg. Added dense foam kidneys to [facility] in an effort to decrease stubborn foot and ankle fibrosis. Cont as per plan.   Rehab Potential Good   OT Frequency 3x / week   OT Duration 12 weeks   OT Treatment/Interventions Self-care/ADL training;Therapeutic exercise;Functional Mobility Training;Patient/family education;Manual Therapy;Manual lymph drainage;Other (comment);DME and/or AE instruction;Compression bandaging;Therapeutic activities  skin care      Patient will benefit from skilled therapeutic intervention in order to improve the following deficits and impairments:  Abnormal  gait, Decreased skin integrity, Decreased knowledge of precautions, Improper body mechanics, Decreased activity tolerance, Decreased knowledge of use of DME, Impaired flexibility, Decreased balance, Decreased mobility, Difficulty walking, Impaired sensation, Decreased range of motion, Increased edema, Pain  Visit Diagnosis: Lymphedema, not elsewhere classified    Problem List Patient Active Problem List   Diagnosis Date Noted  . OA (osteoarthritis) of knee 03/01/2015  . Preoperative cardiovascular examination 01/05/2015  . Spinal stenosis of lumbar region 10/22/2013   Loel Dubonnetheresa Emma Birchler, MS, OTR/L, [facility] 11/23/16 1:28 PM   [facility] [address] [place], [place], [address] Phone: [phone]   Fax:  [phone]  Name: Holly [name] MRN: [id number] Date of Birth: 12/07/1940

## 2016-11-23 NOTE — Patient Instructions (Signed)
LE instructions and precautions as established- see initial eval.   

## 2016-11-24 ENCOUNTER — Ambulatory Visit: Payer: Medicare Other | Admitting: Occupational Therapy

## 2016-11-24 DIAGNOSIS — I890 Lymphedema, not elsewhere classified: Secondary | ICD-10-CM | POA: Diagnosis not present

## 2016-11-24 MED ORDER — NALOXONE HCL 0.4 MG/ML IJ SOLN
INTRAMUSCULAR | Status: AC
  Filled 2016-11-24: qty 1

## 2016-11-24 NOTE — Therapy (Signed)
[place] [facility] [address] [place], [place], [address] Phone: [phone]   Fax:  [phone]  Occupational Therapy Treatment  Patient Details  Name: Holly Fields MRN: [id number] Date of Birth: 08/07/1941 Referring Provider: childhood, exacerbations 2010 and 2016  Encounter Date: 11/24/2016      OT End of Session - 11/24/16 1512    Visit Number 8   Number of Visits 36   Date for OT Re-Evaluation 01/31/17   OT Start Time 0105   OT Stop Time 0210   OT Time Calculation (min) 65 min   Equipment Utilized During Treatment Lymphedema Management and Self Care Workbook- hard copy provided   Activity Tolerance Patient tolerated treatment well;No increased pain   Behavior During Therapy WFL for tasks assessed/performed      Past Medical History:  Diagnosis Date  . Arthritis   . CKD (chronic kidney disease)     saw dr Lowell Guitarpowell 2 years ago, now released from nephrology  . Complication of anesthesia 2010   "sluggish after anesthesia and had vertigo", slow to awaken after knee artroscopy 2012  . Diabetes (HCC)   . Eczema    at times  . Edema   . GERD (gastroesophageal reflux disease)   . HTN (hypertension)   . Hypercholesteremia   . Lymphedema of leg    right more than RIGHT  . Narrowing of airway 2010  . Numbness and tingling of right leg   . Osteopenia   . PONV (postoperative nausea and vomiting)    pt has n/v and vertigo after anesthesia  . Vertigo    at times, cannot turn on left side quickly or sleep on left side  . Vitamin D deficiency     Past Surgical History:  Procedure Laterality Date  . arthroscopic knee surgery Left 2012  . CARDIAC CATHETERIZATION  2012  . JOINT REPLACEMENT Right 2010  . LUMBAR LAMINECTOMY/DECOMPRESSION MICRODISCECTOMY N/A 10/22/2013   Procedure: MICROLUMBAR DECOMPRESSION LUMBAR TWO TO THREE, LUMBAR THREE TO FOUR and faramenotomy two,threeand four ,five bilateral;  Surgeon: Javier DockerJeffrey C  Beane, MD;  Location: [facility];  Service: Orthopedics;  Laterality: N/A;  . NASAL SINUS SURGERY  yrs ago    x 2  . TOTAL KNEE ARTHROPLASTY Left 03/01/2015   Procedure: LEFT TOTAL KNEE ARTHROPLASTY;  Surgeon: Ollen GrossFrank Aluisio, MD;  Location: [facility];  Service: Orthopedics;  Laterality: Left;    There were no vitals filed for this visit.      Subjective Assessment - 11/24/16 1509    Subjective  Pt presents for OT visit 8 for CDT to BLE w/ emphasis on RLE.  Pt has no new complaints. Pt reports she and spouse had to rewrap thigh portion of bandages since last visit , but not below the knee.   Limitations BLE pain and swelling causes difficulty walking, difficulty w/ transfers and decreased functional mobility, decreased standing tolerance, decreased basic and instrumental ADLs performance, ( difficulty sleeping, lower body dressing, home management activities, social and community participation, decreased body image 2/2  feet and legsdisfigurement   Patient Stated Goals decrease pain and leg swelling and keep it down so I can do more; get into a pair of  pretty boots, sleep better, do more volunteering   Currently in Pain? No/denies                      OT Treatments/Exercises (OP) - 11/24/16 0001      ADLs  ADL Education Given Yes     Manual Therapy   Manual Therapy Edema management;Compression Bandaging;Manual Lymphatic Drainage (MLD)   Manual therapy comments skin care with low pH Eucerin lotion   Manual Lymphatic Drainage (MLD) Manual lymph drainage (MLD) to RLE in supine utilizing functional inguinal lymph nodes and deep abdominal lymphatics as is customary for non-cancer related lower extremity LE, including bilateral "short neck" sequence, J strokes to sub and supraclavicular LN, deep abdominal pathways, functional inguinal LN, lower extremity proximal to distal w/ emphasis on medial knee bottleneck and politeal LN. Performed fibrosis technique to B maleoli and distal  legs to  address fatty fibrosis. Good tolerance.   Compression Bandaging RLE gradient compression wraps applied circumferentially in gradient configuration from toes to groin as follows: custom toe wrap using 1 and 2" co-wrap under cotton stockinett from toes to groin; 8 cm x 1 to foot and ankle, 10 cm x 1, then 12 cm x 2- all layered over .04 x 10 cm and 12 cm Rosidol Soft foam from A-G.. 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                OT Education - 11/24/16 1512    Education provided Yes   Education Details Con tinued skilled Pt/caregiver Education  And LE ADL training throughout visit for lymphedema self care, including compression wrapping, compression garment and device wear/care, lymphatic pumping ther ex, simple self-MLD, and skin care. Discussed progress towards goals.   Person(s) Educated Patient;Spouse   Methods Explanation;Demonstration;Handout   Comprehension Verbalized understanding;Need further instruction             OT Long Term Goals - 11/02/16 1205      OT LONG TERM GOAL #1   Title Lymphedema (LE) management/ self-care: Pt able to apply multi layered, gradient compression wraps with MAX caregiver assistance using proper techniques within 2 weeks to achieve optimal limb volume reduction.   Baseline dependent   Time 2   Period Weeks   Status New     OT LONG TERM GOAL #2   Title Lymphedema (LE) management/ self-care:  Pt to achieve at least 10% LLE limb volume reductions bilaterally during Intensive CDT to limit LE progression, decrease infection and falls risk, to reduce pain/,  to improve safe ambulation and functional mobility, and to improve ability to fit preferred LB clothing and street shoes..   Baseline dependent   Time 12   Period Weeks   Status New     OT LONG TERM GOAL #3   Title Lymphedema (LE) management/  self-care:  Pt >/= 85 % compliant with all daily, LE self-care protocols for home program w/ needed level of caregiver assistance , including simple self-manual lymphatic drainage (MLD), skin care, lymphatic pumping the ex, skin care, and donning/ doffing compression wraps and garments o limit LE progression and further functional decline.     Baseline dependent   Time 12   Period Weeks   Status New     OT LONG TERM GOAL #4   Title Lymphedema (LE) management/ self-care:  Pt to tolerate daily compression wraps, garments and devices in keeping w/ prescribed wear regime within 1 week of issue date to progress and retain clinical and functional gains and to limit LE progression.   Baseline dependent   Time 12   Period Weeks   Status  New     OT LONG TERM GOAL #5   Title Lymphedema (LE) self-care:  During Management Phase CDT Pt to sustain limb volume reductions achieved during Intensive Phase CDT within 5% utilizing LE self-care protocols, appropriate compression garments/ devices, and needed level of caregiver assistance.   Baseline dependent   Time 6   Period Months   Status New               Plan - 11/24/16 1512    Clinical Impression Statement Limb volume throughout RLE continues to decrease and tissue fibrosis is palpably decreased throughout ley; however, foot fibrosis remains very stubborn and has not been as responsive to CDT as we had hoped. Pt tolerated extra foam compression without difficulty.  Well continue to wrap using noted techniques in effort to provoke increased decongestion. Pt managing well between visits w/ spouse's assistance for compression wrapping. Pt and spouse verbalized understanding of rational and recomendations for HOS device specifications. Cont as per POC.   Rehab Potential Good   OT Frequency 3x / week   OT Duration 12 weeks   OT Treatment/Interventions Self-care/ADL training;Therapeutic exercise;Functional Mobility Training;Patient/family  education;Manual Therapy;Manual lymph drainage;Other (comment);DME and/or AE instruction;Compression bandaging;Therapeutic activities  skin care      Patient will benefit from skilled therapeutic intervention in order to improve the following deficits and impairments:  Abnormal gait, Decreased skin integrity, Decreased knowledge of precautions, Improper body mechanics, Decreased activity tolerance, Decreased knowledge of use of DME, Impaired flexibility, Decreased balance, Decreased mobility, Difficulty walking, Impaired sensation, Decreased range of motion, Increased edema, Pain  Visit Diagnosis: Lymphedema, not elsewhere classified    Problem List Patient Active Problem List   Diagnosis Date Noted  . OA (osteoarthritis) of knee 03/01/2015  . Preoperative cardiovascular examination 01/05/2015  . Spinal stenosis of lumbar region 10/22/2013    Loel Dubonnet, MS, OTR/L, [facility] 11/24/16 3:16 PM  [place] [facility] MAIN [facility] SERVICES [address] [place], [place], [address] Phone: [phone]   Fax:  [phone]  Name: Holly Fields MRN: [id number] Date of Birth: [birth year]

## 2016-12-06 ENCOUNTER — Ambulatory Visit: Payer: Medicare HMO | Attending: Family Medicine | Admitting: Occupational Therapy

## 2016-12-06 DIAGNOSIS — I890 Lymphedema, not elsewhere classified: Secondary | ICD-10-CM | POA: Diagnosis not present

## 2016-12-06 NOTE — Patient Instructions (Signed)
LE instructions and precautions as established- see initial eval.   

## 2016-12-06 NOTE — Therapy (Addendum)
[place] MAIN [facility] SERVICES [address] [place], [place], [address] Phone: [phone]   Fax:  [phone]  Occupational Therapy Treatment Note and Progress Report  Patient Details  Name: Holly Fields MRN: [id number] Date of Birth: [birth year] Referring Provider: childhood, exacerbations 2010 and 2016  Encounter Date: 12/06/2016    Past Medical History:  Diagnosis Date  . Arthritis   . CKD (chronic kidney disease)     saw dr Florene Glen 2 years ago, now released from nephrology  . Complication of anesthesia 2010   "sluggish after anesthesia and had vertigo", slow to awaken after knee artroscopy 2012  . Diabetes ([place])   . Eczema    at times  . Edema   . GERD (gastroesophageal reflux disease)   . HTN (hypertension)   . Hypercholesteremia   . Lymphedema of leg    right more than RIGHT  . Narrowing of airway 2010  . Numbness and tingling of right leg   . Osteopenia   . PONV (postoperative nausea and vomiting)    pt has n/v and vertigo after anesthesia  . Vertigo    at times, cannot turn on left side quickly or sleep on left side  . Vitamin D deficiency     Past Surgical History:  Procedure Laterality Date  . arthroscopic knee surgery Left 2012  . CARDIAC CATHETERIZATION  2012  . JOINT REPLACEMENT Right 2010  . LUMBAR LAMINECTOMY/DECOMPRESSION MICRODISCECTOMY N/A 10/22/2013   Procedure: MICROLUMBAR DECOMPRESSION LUMBAR TWO TO THREE, LUMBAR THREE TO FOUR and faramenotomy two,threeand four ,five bilateral;  Surgeon: Johnn Hai, MD;  Location: [facility];  Service: Orthopedics;  Laterality: N/A;  . NASAL SINUS SURGERY  yrs ago    x 2  . TOTAL KNEE ARTHROPLASTY Left 03/01/2015   Procedure: LEFT TOTAL KNEE ARTHROPLASTY;  Surgeon: Gaynelle Arabian, MD;  Location: [facility];  Service: Orthopedics;  Laterality: Left;    There were no vitals filed for this visit.      Subjective Assessment - 12/06/16 1049    Subjective  Pt presents for OT  visit 9 for CDT to BLE w/ emphasis on RLE.  Pt last seen before the winter holidays on Dec 22. Pt reports her spouse did very well with compression wraps, which they applied daily as directed. Pt c/o of skin irritation in wraps. She reorts she is anxious to be fitted with compression wraps.   Limitations BLE pain and swelling causes difficulty walking, difficulty w/ transfers and decreased functional mobility, decreased standing tolerance, decreased basic and instrumental ADLs performance, ( difficulty sleeping, lower body dressing, home management activities, social and community participation, decreased body image 2/2  feet and legsdisfigurement   Patient Stated Goals decrease pain and leg swelling and keep it down so I can do more; get into a pair of  pretty boots, sleep better, do more volunteering   Currently in Pain? No/denies             LYMPHEDEMA/ONCOLOGY QUESTIONNAIRE - 12/06/16 1053      Right Lower Extremity Lymphedema   Other RLE below knee (A-D) limb volume= 3387.67 ml. Ankle to groin (A-G) volume =10696.42 ml.   Other RLE A-D limb volume is decreased by 3.58% since last measured on 12/18, and by 11.96% since initially measured on 12/7. [name] Kitchen RLE A-G limb volume is increased by 0.15 % since 12/18, and decreased by 10.06 % since initially measured on 11/09/2016. RLE limb volume reduction GOAL MET.  OT Treatments/Exercises (OP) - 12/06/16 0001      ADLs   ADL Education Given Yes     Manual Therapy   Manual Therapy Edema management   Edema Management RLE comparative limb volumetrics; RLE anatomical measurements for OTS compression thigh highs.                OT Education - 12/06/16 1057    Education provided Yes             OT Long Term Goals - 11/02/16 1205      OT LONG TERM GOAL #1   Title Lymphedema (LE) management/ self-care: Pt able to apply multi layered, gradient compression wraps with MAX caregiver assistance using proper  techniques within 2 weeks to achieve optimal limb volume reduction.   Baseline dependent   Time 2   Period Weeks   Status New     OT LONG TERM GOAL #2   Title Lymphedema (LE) management/ self-care:  Pt to achieve at least 10% LLE limb volume reductions bilaterally during Intensive CDT to limit LE progression, decrease infection and falls risk, to reduce pain/,  to improve safe ambulation and functional mobility, and to improve ability to fit preferred LB clothing and street shoes..   Baseline dependent   Time 12   Period Weeks   Status New     OT LONG TERM GOAL #3   Title Lymphedema (LE) management/ self-care:  Pt >/= 85 % compliant with all daily, LE self-care protocols for home program w/ needed level of caregiver assistance , including simple self-manual lymphatic drainage (MLD), skin care, lymphatic pumping the ex, skin care, and donning/ doffing compression wraps and garments o limit LE progression and further functional decline.     Baseline dependent   Time 12   Period Weeks   Status New     OT LONG TERM GOAL #4   Title Lymphedema (LE) management/ self-care:  Pt to tolerate daily compression wraps, garments and devices in keeping w/ prescribed wear regime within 1 week of issue date to progress and retain clinical and functional gains and to limit LE progression.   Baseline dependent   Time 12   Period Weeks   Status New     OT LONG TERM GOAL #5   Title Lymphedema (LE) self-care:  During Management Phase CDT Pt to sustain limb volume reductions achieved during Intensive Phase CDT within 5% utilizing LE self-care protocols, appropriate compression garments/ devices, and needed level of caregiver assistance.   Baseline dependent   Time 6   Period Months   Status New               Plan - 12/06/16 1057    Clinical Impression Statement RLE A-D limb volume is decreased by 3.58% since last measured on 12/18, and by 11.96% since initially measured on 12/7. [name] Kitchen RLE A-G limb  volume is increased by 0.15 % since 12/18, and decreased by 10.06 % since initially measured on 11/09/2016. RLE limb volume reduction GOAL MET.  Pt and spouse agree w/ plan to initially fit w/ OTS Juzo Dynamic (3512)  a-g, CCL 2 (30-40 mmHg), W/ ot AND sb, to be paired with custom, Jobst Elvarex plus, seamless, ccl 2 toe cap. If this garment specification does not contain swelling, we will then consider custom thigh high as well.   Rehab Potential Good   OT Frequency 3x / week   OT Duration 12 weeks   OT Treatment/Interventions Self-care/ADL training;Therapeutic exercise;Functional Mobility Training;Patient/family  education;Manual Therapy;Manual lymph drainage;Other (comment);DME and/or AE instruction;Compression bandaging;Therapeutic activities  skin care      Patient will benefit from skilled therapeutic intervention in order to improve the following deficits and impairments:  Abnormal gait, Decreased skin integrity, Decreased knowledge of precautions, Improper body mechanics, Decreased activity tolerance, Decreased knowledge of use of DME, Impaired flexibility, Decreased balance, Decreased mobility, Difficulty walking, Impaired sensation, Decreased range of motion, Increased edema, Pain  Visit Diagnosis: Lymphedema, not elsewhere classified    Problem List Patient Active Problem List   Diagnosis Date Noted  . OA (osteoarthritis) of knee 03/01/2015  . Preoperative cardiovascular examination 01/05/2015  . Spinal stenosis of lumbar region 10/22/2013    Andrey Spearman, MS, OTR/L, [facility] 12/06/16 11:04 AM  [place] MAIN [facility] SERVICES [address] [place], [place], [address] Phone: [phone]   Fax:  [phone]  Name: Holly Fields MRN: [id number] Date of Birth: Sep 19, 1941

## 2016-12-08 ENCOUNTER — Ambulatory Visit: Payer: Medicare HMO | Admitting: Occupational Therapy

## 2016-12-08 DIAGNOSIS — I890 Lymphedema, not elsewhere classified: Secondary | ICD-10-CM | POA: Diagnosis not present

## 2016-12-08 NOTE — Therapy (Signed)
[place] [facility] MAIN [facility] SERVICES [address] [place], [place], [address] Phone: [phone]   Fax:  [phone]  Occupational Therapy Treatment  Patient Details  Name: Holly Fields MRN: [id number] Date of Birth: 03-02-1941 Referring Provider: childhood, exacerbations 2010 and 2016  Encounter Date: 12/08/2016      OT End of Session - 12/08/16 1223    Visit Number 10   Number of Visits 36   Date for OT Re-Evaluation 01/31/17   OT Start Time 1102   OT Stop Time 1202   OT Time Calculation (min) 60 min   Equipment Utilized During Treatment Lymphedema Management and Self Care Workbook- hard copy provided   Activity Tolerance Patient tolerated treatment well;No increased pain   Behavior During Therapy WFL for tasks assessed/performed      Past Medical History:  Diagnosis Date  . Arthritis   . CKD (chronic kidney disease)     saw dr Lowell Guitar 2 years ago, now released from nephrology  . Complication of anesthesia 2010   "sluggish after anesthesia and had vertigo", slow to awaken after knee artroscopy 2012  . Diabetes ([facility])   . Eczema    at times  . Edema   . GERD (gastroesophageal reflux disease)   . HTN (hypertension)   . Hypercholesteremia   . Lymphedema of leg    right more than RIGHT  . Narrowing of airway 2010  . Numbness and tingling of right leg   . Osteopenia   . PONV (postoperative nausea and vomiting)    pt has n/v and vertigo after anesthesia  . Vertigo    at times, cannot turn on left side quickly or sleep on left side  . Vitamin D deficiency     Past Surgical History:  Procedure Laterality Date  . arthroscopic knee surgery Left 2012  . CARDIAC CATHETERIZATION  2012  . JOINT REPLACEMENT Right 2010  . LUMBAR LAMINECTOMY/DECOMPRESSION MICRODISCECTOMY N/A 10/22/2013   Procedure: MICROLUMBAR DECOMPRESSION LUMBAR TWO TO THREE, LUMBAR THREE TO FOUR and faramenotomy two,threeand four ,five bilateral;  Surgeon: Javier Docker,  MD;  Location: [facility];  Service: Orthopedics;  Laterality: N/A;  . NASAL SINUS SURGERY  yrs ago    x 2  . TOTAL KNEE ARTHROPLASTY Left 03/01/2015   Procedure: LEFT TOTAL KNEE ARTHROPLASTY;  Surgeon: Ollen Gross, MD;  Location: [facility];  Service: Orthopedics;  Laterality: Left;    There were no vitals filed for this visit.      Subjective Assessment - 12/08/16 1220    Subjective  Pt presents for OT visit 63for CDT to BLE w/ emphasis on RLE.  Pt reports that she ordered recommended OTS compression thigh highs after last visit. We discussed toe cap recommendation at length today and decided that Pt and spouse will dscuss and consider over the weekend and we'll address early next week.   Patient is accompained by: Family member   Limitations BLE pain and swelling causes difficulty walking, difficulty w/ transfers and decreased functional mobility, decreased standing tolerance, decreased basic and instrumental ADLs performance, ( difficulty sleeping, lower body dressing, home management activities, social and community participation, decreased body image 2/2  feet and legsdisfigurement   Repetition Increases Symptoms   Patient Stated Goals decrease pain and leg swelling and keep it down so I can do more; get into a pair of  pretty boots, sleep better, do more volunteering   Currently in Pain? No/denies   Pain Onset More than a month ago  OT Treatments/Exercises (OP) - 12/08/16 0001      ADLs   ADL Education Given Yes     Manual Therapy   Manual Therapy Edema management;Manual Lymphatic Drainage (MLD);Compression Bandaging   Manual therapy comments skin care with low pH Eucerin lotion   Manual Lymphatic Drainage (MLD) Manual lymph drainage (MLD) to RLE in supine utilizing functional inguinal lymph nodes and deep abdominal lymphatics as is customary for non-cancer related lower extremity LE, including bilateral "short neck" sequence, J strokes to sub and  supraclavicular LN, deep abdominal pathways, functional inguinal LN, lower extremity proximal to distal w/ emphasis on medial knee bottleneck and politeal LN. Performed fibrosis technique to B maleoli and distal  legs to address fatty fibrosis. Good tolerance.   Compression Bandaging RLE gradient compression wraps applied circumferentially in gradient configuration from toes to groin as follows: custom toe wrap using 1 and 2" co-wrap under cotton stockinett from toes to groin; 8 cm x 1 to foot and ankle, 10 cm x 1, then 12 cm x 2- all layered over .04 x 10 cm and 12 cm Rosidol Soft foam from A-G.. 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                OT Education - 12/08/16 1222    Education provided Yes   Education Details Cont skilled training re compression garment wear and care routines, recommendations, and measuring / fitting process.   Person(s) Educated Patient;Spouse   Methods Explanation   Comprehension Verbalized understanding;Need further instruction             OT Long Term Goals - 12/08/16 1225      OT LONG TERM GOAL #1   Title Lymphedema (LE) management/ self-care: Pt able to apply multi layered, gradient compression wraps with MAX caregiver assistance using proper techniques within 2 weeks to achieve optimal limb volume reduction.   Baseline dependent   Time 2   Period Weeks   Status Achieved     OT LONG TERM GOAL #2   Title Lymphedema (LE) management/ self-care:  Pt to achieve at least 10% LLE limb volume reductions bilaterally during Intensive CDT to limit LE progression, decrease infection and falls risk, to reduce pain/,  to improve safe ambulation and functional mobility, and to improve ability to fit preferred LB clothing and street shoes..   Baseline dependent   Time 12   Period Weeks   Status Achieved     OT  LONG TERM GOAL #3   Title Lymphedema (LE) management/ self-care:  Pt >/= 85 % compliant with all daily, LE self-care protocols for home program w/ needed level of caregiver assistance , including simple self-manual lymphatic drainage (MLD), skin care, lymphatic pumping the ex, skin care, and donning/ doffing compression wraps and garments o limit LE progression and further functional decline.     Baseline dependent   Period Weeks   Status Achieved     OT LONG TERM GOAL #4   Title Lymphedema (LE) management/ self-care:  Pt to tolerate daily compression wraps, garments and devices in keeping w/ prescribed wear regime within 1 week of issue date to progress and retain clinical and functional gains and to limit LE progression.   Baseline dependent   Time 12   Period Weeks   Status On-going     OT LONG TERM GOAL #5  Title Lymphedema (LE) self-care:  During Management Phase CDT Pt to sustain limb volume reductions achieved during Intensive Phase CDT within 5% utilizing LE self-care protocols, appropriate compression garments/ devices, and needed level of caregiver assistance.   Baseline dependent   Time 6   Period Months   Status On-going               Plan - 12-15-2016 1232    Clinical Impression Statement Pt tolerated MLD and compression wrapping without difficulty today. We discussed compression garment recommendations and process at length again today. Pt and spouse will discuss over the weekend and decide on final configs next week.   Rehab Potential Good   OT Frequency 3x / week   OT Duration 12 weeks   OT Treatment/Interventions Self-care/ADL training;Therapeutic exercise;Functional Mobility Training;Patient/family education;Manual Therapy;Manual lymph drainage;Other (comment);DME and/or AE instruction;Compression bandaging;Therapeutic activities  skin care      Patient will benefit from skilled therapeutic intervention in order to improve the following deficits and  impairments:  Abnormal gait, Decreased skin integrity, Decreased knowledge of precautions, Improper body mechanics, Decreased activity tolerance, Decreased knowledge of use of DME, Impaired flexibility, Decreased balance, Decreased mobility, Difficulty walking, Impaired sensation, Decreased range of motion, Increased edema, Pain  Visit Diagnosis: Lymphedema, not elsewhere classified      G-Codes - 12-15-2016 1231    Functional Assessment Tool Used Clinical observation, physical examination, medical records review for H&P, Pt and caregiver interview, comparative limb volumetrics   Functional Limitation Self care   Self Care Current Status (W0981) At least 60 percent but less than 80 percent impaired, limited or restricted   Self Care Goal Status (X9147) At least 1 percent but less than 20 percent impaired, limited or restricted      Problem List Patient Active Problem List   Diagnosis Date Noted  . OA (osteoarthritis) of knee 03/01/2015  . Preoperative cardiovascular examination 01/05/2015  . Spinal stenosis of lumbar region 10/22/2013    Judithann Sauger 12/15/2016, 12:34 PM  [facility] MAIN [facility] SERVICES [address] [place], [place], [address] Phone: [phone]   Fax:  [phone]  Name: Holly Fields MRN: [id number] Date of Birth: [birth year]

## 2016-12-11 ENCOUNTER — Ambulatory Visit: Payer: Medicare HMO | Admitting: Occupational Therapy

## 2016-12-11 DIAGNOSIS — I890 Lymphedema, not elsewhere classified: Secondary | ICD-10-CM

## 2016-12-11 NOTE — Therapy (Signed)
[facility] MAIN [facility] SERVICES [address] [place], [place], [address] Phone: [phone]   Fax:  [phone]  Occupational Therapy Treatment  Patient Details  Name: Holly Fields MRN: [id number] Date of Birth: 09-25-1941 Referring Provider: childhood, exacerbations 2010 and 2016  Encounter Date: 12/11/2016      OT End of Session - 12/11/16 1659    Visit Number 11   Number of Visits 36   Date for OT Re-Evaluation 01/31/17   OT Start Time 0101   OT Stop Time 0212   OT Time Calculation (min) 71 min   Equipment Utilized During Treatment Lymphedema Management and Self Care Workbook- hard copy provided   Activity Tolerance Patient tolerated treatment well;No increased pain   Behavior During Therapy WFL for tasks assessed/performed      Past Medical History:  Diagnosis Date  . Arthritis   . CKD (chronic kidney disease)     saw dr Lowell Guitar 2 years ago, now released from nephrology  . Complication of anesthesia 2010   "sluggish after anesthesia and had vertigo", slow to awaken after knee artroscopy 2012  . Diabetes (HCC)   . Eczema    at times  . Edema   . GERD (gastroesophageal reflux disease)   . HTN (hypertension)   . Hypercholesteremia   . Lymphedema of leg    right more than RIGHT  . Narrowing of airway 2010  . Numbness and tingling of right leg   . Osteopenia   . PONV (postoperative nausea and vomiting)    pt has n/v and vertigo after anesthesia  . Vertigo    at times, cannot turn on left side quickly or sleep on left side  . Vitamin D deficiency     Past Surgical History:  Procedure Laterality Date  . arthroscopic knee surgery Left 2012  . CARDIAC CATHETERIZATION  2012  . JOINT REPLACEMENT Right 2010  . LUMBAR LAMINECTOMY/DECOMPRESSION MICRODISCECTOMY N/A 10/22/2013   Procedure: MICROLUMBAR DECOMPRESSION LUMBAR TWO TO THREE, LUMBAR THREE TO FOUR and faramenotomy two,threeand four ,five bilateral;  Surgeon: Javier Docker,  MD;  Location: [facility];  Service: Orthopedics;  Laterality: N/A;  . NASAL SINUS SURGERY  yrs ago    x 2  . TOTAL KNEE ARTHROPLASTY Left 03/01/2015   Procedure: LEFT TOTAL KNEE ARTHROPLASTY;  Surgeon: Ollen Gross, MD;  Location: [facility];  Service: Orthopedics;  Laterality: Left;    There were no vitals filed for this visit.                    OT Treatments/Exercises (OP) - 12/11/16 0001      ADLs   ADL Education Given Yes     Manual Therapy   Manual Therapy Edema management;Manual Lymphatic Drainage (MLD);Compression Bandaging   Manual therapy comments skin care with low pH Eucerin lotion   Manual Lymphatic Drainage (MLD) Manual lymph drainage (MLD) to RLE in supine utilizing functional inguinal lymph nodes and deep abdominal lymphatics as is customary for non-cancer related lower extremity LE, including bilateral "short neck" sequence, J strokes to sub and supraclavicular LN, deep abdominal pathways, functional inguinal LN, lower extremity proximal to distal w/ emphasis on medial knee bottleneck and politeal LN. Performed fibrosis technique to B maleoli and distal  legs to address fatty fibrosis. Good tolerance.   Compression Bandaging RLE gradient compression wraps applied circumferentially in gradient configuration from toes to groin as follows: custom toe wrap using 1 and 2" co-wrap under cotton stockinett from  toes to groin; 8 cm x 1 to foot and ankle, 10 cm x 1, then 12 cm x 2- all layered over .04 x 10 cm and 12 cm Rosidol Soft foam from A-G.. 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                OT Education - 12/11/16 1658    Education provided Yes   Education Details Con tinued skilled Pt/caregiver Education  And LE ADL training throughout visit for lymphedema self care, including compression wrapping, compression garment  and device wear/care, lymphatic pumping ther ex, simple self-MLD, and skin care. Discussed progress towards goals.   Person(s) Educated Patient;Spouse   Methods Explanation;Demonstration   Comprehension Verbalized understanding;Need further instruction             OT Long Term Goals - 12/08/16 1225      OT LONG TERM GOAL #1   Title Lymphedema (LE) management/ self-care: Pt able to apply multi layered, gradient compression wraps with MAX caregiver assistance using proper techniques within 2 weeks to achieve optimal limb volume reduction.   Baseline dependent   Time 2   Period Weeks   Status Achieved     OT LONG TERM GOAL #2   Title Lymphedema (LE) management/ self-care:  Pt to achieve at least 10% LLE limb volume reductions bilaterally during Intensive CDT to limit LE progression, decrease infection and falls risk, to reduce pain/,  to improve safe ambulation and functional mobility, and to improve ability to fit preferred LB clothing and street shoes..   Baseline dependent   Time 12   Period Weeks   Status Achieved     OT LONG TERM GOAL #3   Title Lymphedema (LE) management/ self-care:  Pt >/= 85 % compliant with all daily, LE self-care protocols for home program w/ needed level of caregiver assistance , including simple self-manual lymphatic drainage (MLD), skin care, lymphatic pumping the ex, skin care, and donning/ doffing compression wraps and garments o limit LE progression and further functional decline.     Baseline dependent   Period Weeks   Status Achieved     OT LONG TERM GOAL #4   Title Lymphedema (LE) management/ self-care:  Pt to tolerate daily compression wraps, garments and devices in keeping w/ prescribed wear regime within 1 week of issue date to progress and retain clinical and functional gains and to limit LE progression.   Baseline dependent   Time 12   Period Weeks   Status On-going     OT LONG TERM GOAL #5   Title Lymphedema (LE) self-care:  During  Management Phase CDT Pt to sustain limb volume reductions achieved during Intensive Phase CDT within 5% utilizing LE self-care protocols, appropriate compression garments/ devices, and needed level of caregiver assistance.   Baseline dependent   Time 6   Period Months   Status On-going               Plan - 12/11/16 1700    Clinical Impression Statement senting today with mild RLE skin redness at thigh and below knee 2/2 bandage fatigue. Pt tolerating wraps well but is anxious to receive new compression stockings ordered late last week. Pt and spouse educated about custom garment measurement and fitting process, and pros        of local vendor vs limited customer service from  on line vendors. Good session. Complete toe cap measurements next session. DME vendor provided estimate by request earlier today.   Rehab Potential Good   OT Frequency 3x / week   OT Duration 12 weeks   OT Treatment/Interventions Self-care/ADL training;Therapeutic exercise;Functional Mobility Training;Patient/family education;Manual Therapy;Manual lymph drainage;Other (comment);DME and/or AE instruction;Compression bandaging;Therapeutic activities  skin care      Patient will benefit from skilled therapeutic intervention in order to improve the following deficits and impairments:  Abnormal gait, Decreased skin integrity, Decreased knowledge of precautions, Improper body mechanics, Decreased activity tolerance, Decreased knowledge of use of DME, Impaired flexibility, Decreased balance, Decreased mobility, Difficulty walking, Impaired sensation, Decreased range of motion, Increased edema, Pain  Visit Diagnosis: Lymphedema, not elsewhere classified    Problem List Patient Active Problem List   Diagnosis Date Noted  . OA (osteoarthritis) of knee 03/01/2015  . Preoperative cardiovascular examination 01/05/2015  . Spinal stenosis of lumbar region 10/22/2013    Loel Dubonnet, MS, OTR/L, [facility] 12/11/16 5:05  PM  [facility] MAIN [facility] SERVICES [address] [place], [place], [address] Phone: [phone]   Fax:  [phone]  Name: Holly Fields MRN: [id number] Date of Birth: Jan 03, 1941

## 2016-12-11 NOTE — Patient Instructions (Signed)
LE instructions and precautions as established- see initial eval.   

## 2016-12-13 ENCOUNTER — Ambulatory Visit: Payer: Medicare HMO | Admitting: Occupational Therapy

## 2016-12-13 DIAGNOSIS — I890 Lymphedema, not elsewhere classified: Secondary | ICD-10-CM | POA: Diagnosis not present

## 2016-12-13 NOTE — Patient Instructions (Signed)
Holly Dubonnetheresa Drury Ardizzone, MS, OTR/L, CLT-LANA @TODAY @ 4:45 PM

## 2016-12-13 NOTE — Therapy (Signed)
[place] [facility] MAIN [facility] SERVICES [address] [place], [place], [address] Phone: [phone]   Fax:  [phone]  Occupational Therapy Treatment  Patient Details  Name: Holly Fields MRN: [id number] Date of Birth: November 24, 1941 Referring Provider: childhood, exacerbations 2010 and 2016  Encounter Date: 12/13/2016      OT End of Session - 12/13/16 1651    Visit Number 12   Number of Visits 36   Date for OT Re-Evaluation 01/31/17   OT Start Time 0803   OT Stop Time 0907   OT Time Calculation (min) 64 min   Equipment Utilized During Treatment Lymphedema Management and Self Care Workbook- hard copy provided   Activity Tolerance Patient tolerated treatment well;No increased pain   Behavior During Therapy WFL for tasks assessed/performed      Past Medical History:  Diagnosis Date  . Arthritis   . CKD (chronic kidney disease)     saw dr Lowell Guitar 2 years ago, now released from nephrology  . Complication of anesthesia 2010   "sluggish after anesthesia and had vertigo", slow to awaken after knee artroscopy 2012  . Diabetes (HCC)   . Eczema    at times  . Edema   . GERD (gastroesophageal reflux disease)   . HTN (hypertension)   . Hypercholesteremia   . Lymphedema of leg    right more than RIGHT  . Narrowing of airway 2010  . Numbness and tingling of right leg   . Osteopenia   . PONV (postoperative nausea and vomiting)    pt has n/v and vertigo after anesthesia  . Vertigo    at times, cannot turn on left side quickly or sleep on left side  . Vitamin D deficiency     Past Surgical History:  Procedure Laterality Date  . arthroscopic knee surgery Left 2012  . CARDIAC CATHETERIZATION  2012  . JOINT REPLACEMENT Right 2010  . LUMBAR LAMINECTOMY/DECOMPRESSION MICRODISCECTOMY N/A 10/22/2013   Procedure: MICROLUMBAR DECOMPRESSION LUMBAR TWO TO THREE, LUMBAR THREE TO FOUR and faramenotomy two,threeand four ,five bilateral;  Surgeon: Javier Docker, MD;  Location: [facility];  Service: Orthopedics;  Laterality: N/A;  . NASAL SINUS SURGERY  yrs ago    x 2  . TOTAL KNEE ARTHROPLASTY Left 03/01/2015   Procedure: LEFT TOTAL KNEE ARTHROPLASTY;  Surgeon: Ollen Gross, MD;  Location: [facility];  Service: Orthopedics;  Laterality: Left;    There were no vitals filed for this visit.      Subjective Assessment - 12/13/16 1633    Subjective  Pt presents for OT visit 12 for CDT to BLE w/ emphasis on RLE.  Pt presents with recommended ccl 2 ( 30-40) thigh high compression stocking in place. Pt agreeable to completing custom toe cap measurement today.   Patient is accompained by: Family member   Limitations BLE pain and swelling causes difficulty walking, difficulty w/ transfers and decreased functional mobility, decreased standing tolerance, decreased basic and instrumental ADLs performance, ( difficulty sleeping, lower body dressing, home management activities, social and community participation, decreased body image 2/2  feet and legsdisfigurement   Repetition Increases Symptoms   Patient Stated Goals decrease pain and leg swelling and keep it down so I can do more; get into a pair of  pretty boots, sleep better, do more volunteering   Currently in Pain? No/denies   Pain Onset More than a month ago             LYMPHEDEMA/ONCOLOGY QUESTIONNAIRE - 12/13/16  1647      Left Lower Extremity Lymphedema   Other LLE below knee (A-D) limb volume= 3380.08 ml, which is decreased by 4.25% since commencing CDT to RLE on 11/09/16. Ankle to groin (A-G) volume = 10302.43 ml, ehich is minutely decreased by 0.062% since 11/09/16.                 OT Treatments/Exercises (OP) - 12/13/16 0001      ADLs   ADL Education Given Yes     Manual Therapy   Manual Therapy Edema management;Manual Lymphatic Drainage (MLD);Compression Bandaging   Manual therapy comments anatomical measurements for RLE custom toe cap .Faxed info to vendor.   Edema Management  RLE comparative limb volumetrics   Compression Bandaging RLE gradient compression wraps applied circumferentially in gradient configuration from toes to groin as follows: custom toe wrap using 1 and 2" co-wrap under cotton stockinett from toes to groin; 8 cm x 1 to foot and ankle, 10 cm x 1, then 12 cm x 2- all layered over .04 x 10 cm and 12 cm Rosidol Soft foam from A-G.. 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                OT Education - 12/13/16 1646    Education provided Yes   Education Details Pt edu for donning, doffing, wear and care of RLE compression garments using assistive devices. Provided on line resources for AE.   Person(s) Educated Patient;Spouse   Methods Explanation;Demonstration   Comprehension Verbalized understanding;Need further instruction             OT Long Term Goals - 12/08/16 1225      OT LONG TERM GOAL #1   Title Lymphedema (LE) management/ self-care: Pt able to apply multi layered, gradient compression wraps with MAX caregiver assistance using proper techniques within 2 weeks to achieve optimal limb volume reduction.   Baseline dependent   Time 2   Period Weeks   Status Achieved     OT LONG TERM GOAL #2   Title Lymphedema (LE) management/ self-care:  Pt to achieve at least 10% LLE limb volume reductions bilaterally during Intensive CDT to limit LE progression, decrease infection and falls risk, to reduce pain/,  to improve safe ambulation and functional mobility, and to improve ability to fit preferred LB clothing and street shoes..   Baseline dependent   Time 12   Period Weeks   Status Achieved     OT LONG TERM GOAL #3   Title Lymphedema (LE) management/ self-care:  Pt >/= 85 % compliant with all daily, LE self-care protocols for home program w/ needed level of caregiver assistance , including simple  self-manual lymphatic drainage (MLD), skin care, lymphatic pumping the ex, skin care, and donning/ doffing compression wraps and garments o limit LE progression and further functional decline.     Baseline dependent   Period Weeks   Status Achieved     OT LONG TERM GOAL #4   Title Lymphedema (LE) management/ self-care:  Pt to tolerate daily compression wraps, garments and devices in keeping w/ prescribed wear regime within 1 week of issue date to progress and retain clinical and functional gains and to limit LE progression.   Baseline dependent   Time 12   Period Weeks   Status On-going     OT LONG TERM GOAL #  5   Title Lymphedema (LE) self-care:  During Management Phase CDT Pt to sustain limb volume reductions achieved during Intensive Phase CDT within 5% utilizing LE self-care protocols, appropriate compression garments/ devices, and needed level of caregiver assistance.   Baseline dependent   Time 6   Period Months   Status On-going               Plan - 12/13/16 1652    Clinical Impression Statement LLE comparative volumetrics reveal LLE below knee (A-D) limb volume= 3380.08 ml, which is decreased by 4.25% since commencing CDT to RLE on 11/09/16. Ankle to groin (A-G) volume = 10302.43 ml, ehich is minutely decreased by 0.062% since 11/09/16.[name] Kitchen. Pt requires additional skilled edu for compression garment wear and care. RLE stocking fits relatively well except at anterior ankle crease where some creasing is noted. Pt encouraged to pull excess fabric upward to distribute compression and avoid tourniquet effect as much as possible.   Rehab Potential Good   OT Frequency 3x / week   OT Duration 12 weeks   OT Treatment/Interventions Self-care/ADL training;Therapeutic exercise;Functional Mobility Training;Patient/family education;Manual Therapy;Manual lymph drainage;Other (comment);DME and/or AE instruction;Compression bandaging;Therapeutic activities  skin care      Patient will benefit  from skilled therapeutic intervention in order to improve the following deficits and impairments:  Abnormal gait, Decreased skin integrity, Decreased knowledge of precautions, Improper body mechanics, Decreased activity tolerance, Decreased knowledge of use of DME, Impaired flexibility, Decreased balance, Decreased mobility, Difficulty walking, Impaired sensation, Decreased range of motion, Increased edema, Pain  Visit Diagnosis: Lymphedema, not elsewhere classified    Problem List Patient Active Problem List   Diagnosis Date Noted  . OA (osteoarthritis) of knee 03/01/2015  . Preoperative cardiovascular examination 01/05/2015  . Spinal stenosis of lumbar region 10/22/2013    Loel Dubonnetheresa Javarri Segal, MS, OTR/L, [facility] 12/13/16 4:56 PM   [facility] MAIN [facility] [address] [place], [place], [address] Phone: [phone]   Fax:  [phone]  Name: Holly Fields MRN: [id number] Date of Birth: 08/04/1941

## 2016-12-15 ENCOUNTER — Encounter: Payer: Medicare Other | Admitting: Occupational Therapy

## 2016-12-18 ENCOUNTER — Ambulatory Visit: Payer: Medicare HMO | Admitting: Occupational Therapy

## 2016-12-18 DIAGNOSIS — I890 Lymphedema, not elsewhere classified: Secondary | ICD-10-CM

## 2016-12-18 NOTE — Patient Instructions (Signed)
12-18-16: Utilize compression full time during the day. Do not sleep in compression stockings intended for daytime use. I lieu of HOs devices, Wrap legs to groin 2-3 times weekly and PRN to limit increasing fibrosis.  Lymphedema Precautions  If you experience atypical shortness of breath, or notice any signs /symptoms of skin infection (aka cellulitis) remove all compression wraps/ garments, discontinue manual lymphatic drainage (MLD), and report symptoms to your physician immediately. Discontinue MLD and compression for 72 hours after you take your first oral antibiotic so not to spread the infection.   Lymphedema Self- Care Instructions  1. EXERCISE: Perform lymphatic pumping there ex 2 x a day. While wearing your compression wraps or garments. Perform 10 reps of each exercise bilaterally and be sure to perform them in order. Don;t skip around!  OMIT PARTIAL SIT UP  2. MLD: Perform simple self-Manual Lymphatic Drainage (MLD) at least once a day as directed.  3. WRAPS: Compression wraps are to be worn 23 hrs/ 7 days/wk during Intensive Phase of Complete Decongestive Therapy (CDT).Building tolerance may take time and practice, so don't get discouraged. If bandages begin to feel tight during periods of inactivity and/or during the night, try performing your exercises to loosen them.   4. GARMENTS: During Management Phase CDT your compression garments are to be worn during waking hours when active. Do NOT sleep in your garments!!   5. PUT YOUR FEET UP! Elevate your feet and legs and feet to the level of your heart whenever you are sitting down.   6. SKIN: Carefully monitor skin condition and perform impeccable hygiene daily. Bathe skin with mild soap and water and apply low pH lotion (aka Eucerin ) to improve hydration and limit infection risk.    Lymphatic Pumping Exercises:

## 2016-12-18 NOTE — Therapy (Signed)
[facility] MAIN [facility] SERVICES [address] [place], [place], [address] Phone: [phone]   Fax:  [phone]  Occupational Therapy Treatment  Patient Details  Name: Holly Fields MRN: [id number] Date of Birth: 1941/07/05 Referring Provider: childhood, exacerbations 2010 and 2016  Encounter Date: 12/18/2016      OT End of Session - 12/18/16 1627    Visit Number 13   Number of Visits 36   Date for OT Re-Evaluation 01/31/17   OT Start Time 0200   OT Stop Time 0305   OT Time Calculation (min) 65 min   Equipment Utilized During Treatment Lymphedema Management and Self Care Workbook- hard copy provided   Activity Tolerance Patient tolerated treatment well;No increased pain   Behavior During Therapy WFL for tasks assessed/performed      Past Medical History:  Diagnosis Date  . Arthritis   . CKD (chronic kidney disease)     saw dr Lowell Guitar 2 years ago, now released from nephrology  . Complication of anesthesia 2010   "sluggish after anesthesia and had vertigo", slow to awaken after knee artroscopy 2012  . Diabetes (HCC)   . Eczema    at times  . Edema   . GERD (gastroesophageal reflux disease)   . HTN (hypertension)   . Hypercholesteremia   . Lymphedema of leg    right more than RIGHT  . Narrowing of airway 2010  . Numbness and tingling of right leg   . Osteopenia   . PONV (postoperative nausea and vomiting)    pt has n/v and vertigo after anesthesia  . Vertigo    at times, cannot turn on left side quickly or sleep on left side  . Vitamin D deficiency     Past Surgical History:  Procedure Laterality Date  . arthroscopic knee surgery Left 2012  . CARDIAC CATHETERIZATION  2012  . JOINT REPLACEMENT Right 2010  . LUMBAR LAMINECTOMY/DECOMPRESSION MICRODISCECTOMY N/A 10/22/2013   Procedure: MICROLUMBAR DECOMPRESSION LUMBAR TWO TO THREE, LUMBAR THREE TO FOUR and faramenotomy two,threeand four ,five bilateral;  Surgeon: Javier Docker, MD;  Location: [facility];  Service: Orthopedics;  Laterality: N/A;  . NASAL SINUS SURGERY  yrs ago    x 2  . TOTAL KNEE ARTHROPLASTY Left 03/01/2015   Procedure: LEFT TOTAL KNEE ARTHROPLASTY;  Surgeon: Ollen Gross, MD;  Location: [facility];  Service: Orthopedics;  Laterality: Left;    There were no vitals filed for this visit.      Subjective Assessment - 12/18/16 1627    Subjective  Pt presents for OT visit 13 for CDT to BLE w/ emphasis on RLE. Pt arrives w/ BLE compression thigh highs in place. Pt states she does not wish to [facility] CDT to RLE st this time. She states she is pleased with clinical gains made in swelling reduction on the L, and is staisfied with RLE condition. "I don't want my R foot getting too small or my shoes wont  fit."   Patient is accompained by: Family member   Limitations BLE pain and swelling causes difficulty walking, difficulty w/ transfers and decreased functional mobility, decreased standing tolerance, decreased basic and instrumental ADLs performance, ( difficulty sleeping, lower body dressing, home management activities, social and community participation, decreased body image 2/2  feet and legsdisfigurement   Repetition Increases Symptoms   Patient Stated Goals decrease pain and leg swelling and keep it down so I can do more; get into a pair of  pretty boots, sleep  better, do more volunteering   Currently in Pain? Yes  pain unchanged on RLE since initial eval. LLE pain slightly reduced by report since initial eval. No numerical rating today   Pain Onset More than a month ago                              OT Education - 12/18/16 1633    Education provided Yes   Education Details Emphasis of LE self care training today on donning and doffing compression garments  using various assistive devices. Also discussed management phase cdt goals and self care going forward as Pt transitions to [place] Phase CDT   Person(s) Educated  Patient;Spouse   Methods Explanation;Demonstration;Tactile cues;Verbal cues   Comprehension Verbalized understanding;Returned demonstration;Verbal cues required;Tactile cues required             OT Long Term Goals - 12/08/16 1225      OT LONG TERM GOAL #1   Title Lymphedema (LE) management/ self-care: Pt able to apply multi layered, gradient compression wraps with MAX caregiver assistance using proper techniques within 2 weeks to achieve optimal limb volume reduction.   Baseline dependent   Time 2   Period Weeks   Status Achieved     OT LONG TERM GOAL #2   Title Lymphedema (LE) management/ self-care:  Pt to achieve at least 10% LLE limb volume reductions bilaterally during Intensive CDT to limit LE progression, decrease infection and falls risk, to reduce pain/,  to improve safe ambulation and functional mobility, and to improve ability to fit preferred LB clothing and street shoes..   Baseline dependent   Time 12   Period Weeks   Status Achieved     OT LONG TERM GOAL #3   Title Lymphedema (LE) management/ self-care:  Pt >/= 85 % compliant with all daily, LE self-care protocols for home program w/ needed level of caregiver assistance , including simple self-manual lymphatic drainage (MLD), skin care, lymphatic pumping the ex, skin care, and donning/ doffing compression wraps and garments o limit LE progression and further functional decline.     Baseline dependent   Period Weeks   Status Achieved     OT LONG TERM GOAL #4   Title Lymphedema (LE) management/ self-care:  Pt to tolerate daily compression wraps, garments and devices in keeping w/ prescribed wear regime within 1 week of issue date to progress and retain clinical and functional gains and to limit LE progression.   Baseline dependent   Time 12   Period Weeks   Status On-going     OT LONG TERM GOAL #5   Title Lymphedema (LE) self-care:  During Management Phase CDT Pt to sustain limb volume reductions achieved during  Intensive Phase CDT within 5% utilizing LE self-care protocols, appropriate compression garments/ devices, and needed level of caregiver assistance.   Baseline dependent   Time 6   Period Months   Status On-going               Plan - 12/18/16 1635    Clinical Impression Statement Pt ready to transition to Management Phase CDT today. She is pleased with outcome for RLE limb volume reduction and tissue softening, and is satisfied with condition on LLE. Pt has been fit with off the shelf , ccl 2, thigh high compression garments (Juzo Dynamic 3512 A-G 30-40 mmHg) . She is awaiting delivery of custom Elvarex soft PLUS seamless, ccl 2 toe cap. Pt  agrees w/ plan to decrease OT frequency to 1 x weekly until fitting is complete.   Rehab Potential Good   OT Frequency 1x / week   OT Duration 4 weeks   OT Treatment/Interventions Self-care/ADL training;Therapeutic exercise;Functional Mobility Training;Patient/family education;Manual Therapy;Manual lymph drainage;Other (comment);DME and/or AE instruction;Compression bandaging;Therapeutic activities  skin care      Patient will benefit from skilled therapeutic intervention in order to improve the following deficits and impairments:  Abnormal gait, Decreased skin integrity, Decreased knowledge of precautions, Improper body mechanics, Decreased activity tolerance, Decreased knowledge of use of DME, Impaired flexibility, Decreased balance, Decreased mobility, Difficulty walking, Impaired sensation, Decreased range of motion, Increased edema, Pain  Visit Diagnosis: Lymphedema, not elsewhere classified    Problem List Patient Active Problem List   Diagnosis Date Noted  . OA (osteoarthritis) of knee 03/01/2015  . Preoperative cardiovascular examination 01/05/2015  . Spinal stenosis of lumbar region 10/22/2013   Loel Dubonnet, MS, OTR/L, [facility] 12/18/16 4:41 PM  [facility] MAIN [facility] SERVICES [address] [place], [place], [address] Phone: [phone]   Fax:  [phone]  Name: Holly Fields MRN: [id number] Date of Birth: Nov 25, 1941

## 2016-12-20 ENCOUNTER — Encounter: Payer: Medicare Other | Admitting: Occupational Therapy

## 2016-12-22 ENCOUNTER — Encounter: Payer: Medicare Other | Admitting: Occupational Therapy

## 2016-12-26 ENCOUNTER — Ambulatory Visit: Payer: Medicare HMO | Admitting: Occupational Therapy

## 2016-12-26 DIAGNOSIS — I890 Lymphedema, not elsewhere classified: Secondary | ICD-10-CM

## 2016-12-26 NOTE — Therapy (Signed)
[place] [facility] MAIN [facility] SERVICES [address] [place], [place], [address] Phone: [phone]   Fax:  [phone]  Occupational Therapy Treatment  Patient Details  Name: Holly Fields MRN: [id number] Date of Birth: [birth year] Referring Provider: childhood, exacerbations 2010 and 2016  Encounter Date: 12/26/2016      OT End of Session - 12/26/16 1348    Visit Number 14   Number of Visits 36   Date for OT Re-Evaluation 01/31/17   OT Start Time 1002   OT Stop Time 1106   OT Time Calculation (min) 64 min   Equipment Utilized During Treatment Lymphedema Management and Self Care Workbook- hard copy provided   Activity Tolerance Patient tolerated treatment well;No increased pain   Behavior During Therapy WFL for tasks assessed/performed      Past Medical History:  Diagnosis Date  . Arthritis   . CKD (chronic kidney disease)     saw dr Lowell Guitar 2 years ago, now released from nephrology  . Complication of anesthesia 2010   "sluggish after anesthesia and had vertigo", slow to awaken after knee artroscopy 2012  . Diabetes (HCC)   . Eczema    at times  . Edema   . GERD (gastroesophageal reflux disease)   . HTN (hypertension)   . Hypercholesteremia   . Lymphedema of leg    right more than RIGHT  . Narrowing of airway 2010  . Numbness and tingling of right leg   . Osteopenia   . PONV (postoperative nausea and vomiting)    pt has n/v and vertigo after anesthesia  . Vertigo    at times, cannot turn on left side quickly or sleep on left side  . Vitamin D deficiency     Past Surgical History:  Procedure Laterality Date  . arthroscopic knee surgery Left 2012  . CARDIAC CATHETERIZATION  2012  . JOINT REPLACEMENT Right 2010  . LUMBAR LAMINECTOMY/DECOMPRESSION MICRODISCECTOMY N/A 10/22/2013   Procedure: MICROLUMBAR DECOMPRESSION LUMBAR TWO TO THREE, LUMBAR THREE TO FOUR and faramenotomy two,threeand four ,five bilateral;  Surgeon: Javier Docker, MD;  Location: [facility];  Service: Orthopedics;  Laterality: N/A;  . NASAL SINUS SURGERY  yrs ago    x 2  . TOTAL KNEE ARTHROPLASTY Left 03/01/2015   Procedure: LEFT TOTAL KNEE ARTHROPLASTY;  Surgeon: Ollen Gross, MD;  Location: [facility];  Service: Orthopedics;  Laterality: Left;    There were no vitals filed for this visit.      Subjective Assessment - 12/26/16 1342    Subjective  Pt presents for OT visit 14 for CDT to BLE w/ emphasis on RLE.  Pt is accompanied by her spouse. The RLE is wrapped    from toes to groin with compression wraps and Pt is wearing class 1 (20-30 mmHg) garment on the LLE. Pt  reports she is alternating class 1 and class 2 garments daily due to ongoing LLE sciatica. Pt has several questions reguarding  alternative compression garments/ devices for daytime and HOS.   Patient is accompained by: Family member   Limitations BLE pain and swelling causes difficulty walking, difficulty w/ transfers and decreased functional mobility, decreased standing tolerance, decreased basic and instrumental ADLs performance, ( difficulty sleeping, lower body dressing, home management activities, social and community participation, decreased body image 2/2  feet and legsdisfigurement   Repetition Increases Symptoms   Patient Stated Goals decrease pain and leg swelling and keep it down so I can do more; get into a  pair of  pretty boots, sleep better, do more volunteering   Pain Onset More than a month ago                      OT Treatments/Exercises (OP) - 12/26/16 0001      ADLs   ADL Education Given Yes                OT Education - 12/26/16 1345    Education provided Yes   Education Details Emphasis of visit today on skilled edu for alternative compression garments vs standard elastic compression garments for day time, for HOS devices-options and  choices, and options for second pair of thigh highs.   Person(s) Educated Patient   Methods  Explanation;Demonstration;Tactile cues;Verbal cues;Handout   Comprehension Need further instruction;Verbalized understanding;Returned demonstration;Verbal cues required;Tactile cues required             OT Long Term Goals - 12/08/16 1225      OT LONG TERM GOAL #1   Title Lymphedema (LE) management/ self-care: Pt able to apply multi layered, gradient compression wraps with MAX caregiver assistance using proper techniques within 2 weeks to achieve optimal limb volume reduction.   Baseline dependent   Time 2   Period Weeks   Status Achieved     OT LONG TERM GOAL #2   Title Lymphedema (LE) management/ self-care:  Pt to achieve at least 10% LLE limb volume reductions bilaterally during Intensive CDT to limit LE progression, decrease infection and falls risk, to reduce pain/,  to improve safe ambulation and functional mobility, and to improve ability to fit preferred LB clothing and street shoes..   Baseline dependent   Time 12   Period Weeks   Status Achieved     OT LONG TERM GOAL #3   Title Lymphedema (LE) management/ self-care:  Pt >/= 85 % compliant with all daily, LE self-care protocols for home program w/ needed level of caregiver assistance , including simple self-manual lymphatic drainage (MLD), skin care, lymphatic pumping the ex, skin care, and donning/ doffing compression wraps and garments o limit LE progression and further functional decline.     Baseline dependent   Period Weeks   Status Achieved     OT LONG TERM GOAL #4   Title Lymphedema (LE) management/ self-care:  Pt to tolerate daily compression wraps, garments and devices in keeping w/ prescribed wear regime within 1 week of issue date to progress and retain clinical and functional gains and to limit LE progression.   Baseline dependent   Time 12   Period Weeks   Status On-going     OT LONG TERM GOAL #5   Title Lymphedema (LE) self-care:  During Management Phase CDT Pt to sustain limb volume reductions achieved  during Intensive Phase CDT within 5% utilizing LE self-care protocols, appropriate compression garments/ devices, and needed level of caregiver assistance.   Baseline dependent   Time 6   Period Months   Status On-going               Plan - 12/26/16 1349    Clinical Impression Statement Pt continuing to have difficulty donning and doffing compression garments without assistance. Pt also having difficulty tolerating ccl 2 garment on LLE 2/2 sciatica. She reports mm spasms in her hands this morning made it impossible to finish donning garments w/out help. Provided Pt and caregiver edu throughout session today for addiditonal assistive device ( donn 'N Doffer) . Also demonstrated adjustable compression  w/ Velcro clusures, including Farrow custom, Farrow wrap lites, and CircAid . Assisted Pt w/ ordering 2nd pair of compression on the phone, but we increasaed frassistive device for trial.m size 4 to size 5 to decrease donning/ doffing effort. Loan    Rehab Potential Good   OT Frequency 1x / week   OT Duration 4 weeks   OT Treatment/Interventions Self-care/ADL training;Therapeutic exercise;Functional Mobility Training;Patient/family education;Manual Therapy;Manual lymph drainage;Other (comment);DME and/or AE instruction;Compression bandaging;Therapeutic activities  skin care      Patient will benefit from skilled therapeutic intervention in order to improve the following deficits and impairments:  Abnormal gait, Decreased skin integrity, Decreased knowledge of precautions, Improper body mechanics, Decreased activity tolerance, Decreased knowledge of use of DME, Impaired flexibility, Decreased balance, Decreased mobility, Difficulty walking, Impaired sensation, Decreased range of motion, Increased edema, Pain  Visit Diagnosis: Lymphedema, not elsewhere classified    Problem List Patient Active Problem List   Diagnosis Date Noted  . OA (osteoarthritis) of knee 03/01/2015  . Preoperative  cardiovascular examination 01/05/2015  . Spinal stenosis of lumbar region 10/22/2013    Loel Dubonnet, MS, OTR/L, [facility] 12/26/16 1:54 PM  [place] [facility] MAIN [facility] SERVICES [address] [place], [place], [address] Phone: [phone]   Fax:  [phone]  Name: Holly Fields MRN: [id number] Date of Birth: 22-Nov-1941

## 2016-12-28 ENCOUNTER — Encounter: Payer: Medicare Other | Admitting: Occupational Therapy

## 2016-12-29 ENCOUNTER — Encounter: Payer: Medicare Other | Admitting: Occupational Therapy

## 2016-12-31 IMAGING — US US ABDOMEN COMPLETE
1 series · 13 of 25 positions shown · non-contrast
Comparison: 02/10/2010

CLINICAL DATA: Epigastric discomfort. Lot of gas, belching.
Bloating. Epigastric fullness and loss of appetite. Constipation 3
weeks. Symptoms improved on new meds. History of diabetes,
hypertension.

EXAM:
ULTRASOUND ABDOMEN COMPLETE

[Series 1: us abdomen complete · 0.28mm/px · 13 of 91 slices shown]
[im 1/91]
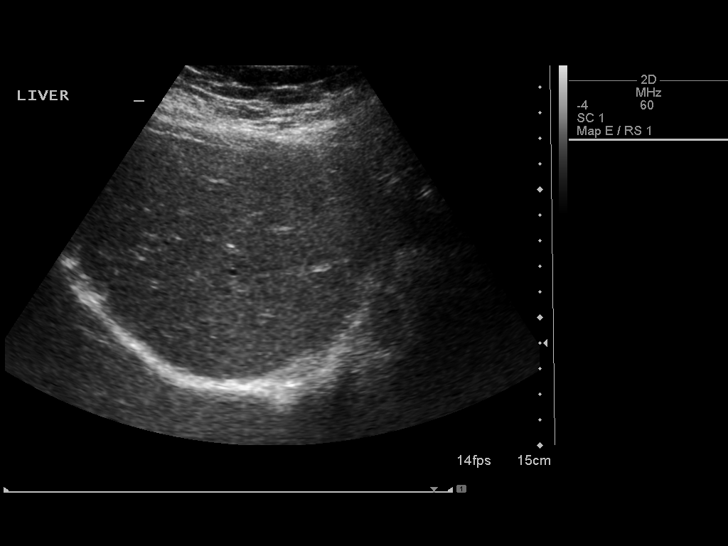
[im 8/91]
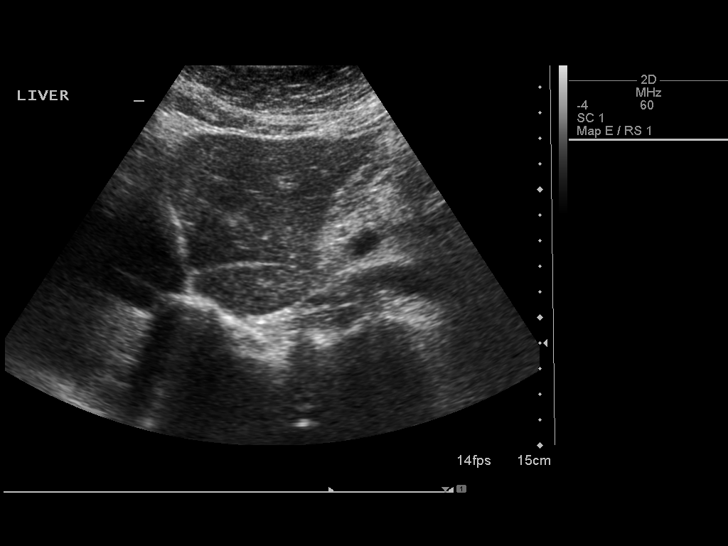
[im 16/91]
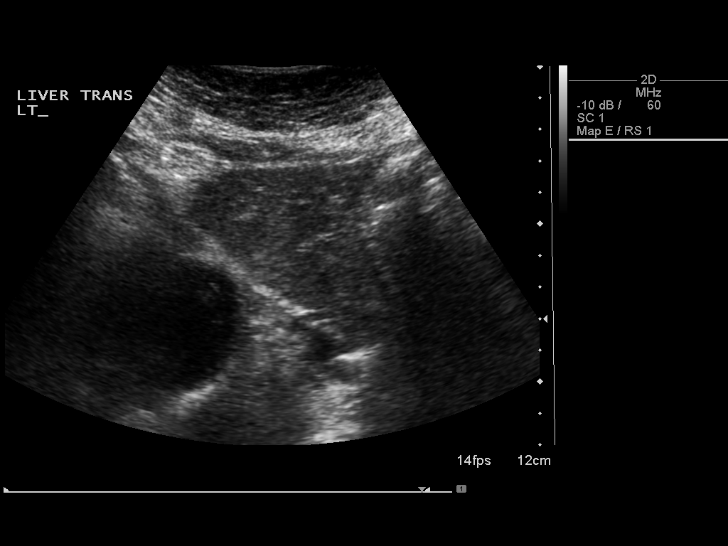
[im 23/91]
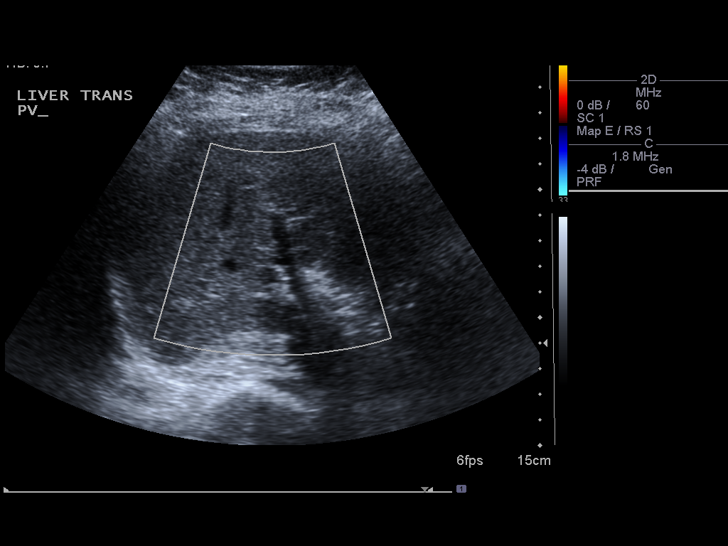
[im 31/91]
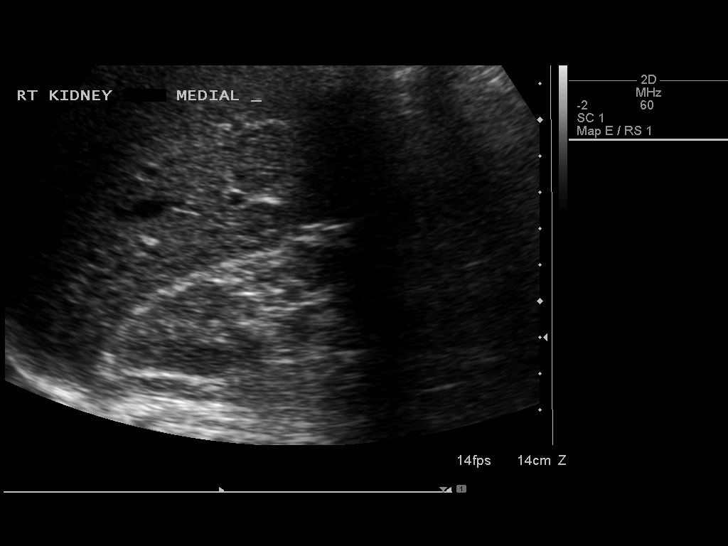
[im 38/91]
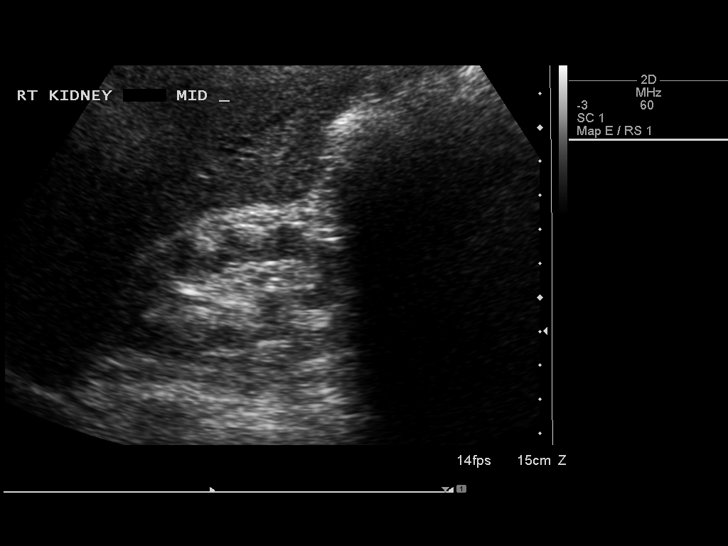
[im 46/91]
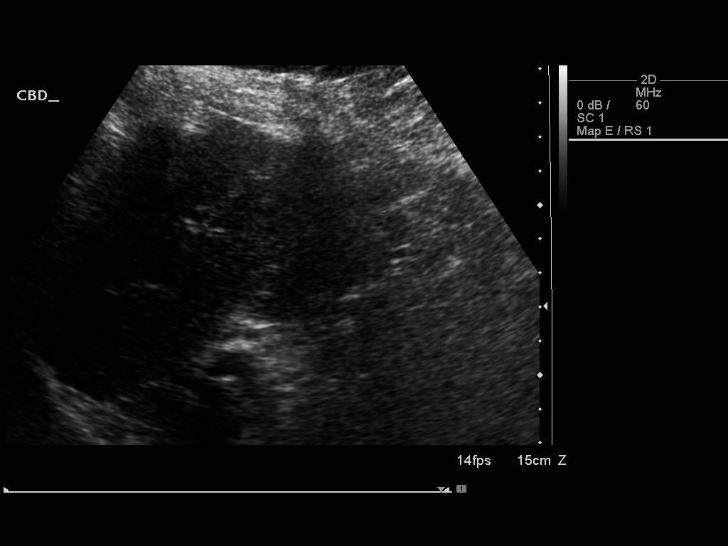
[im 53/91]
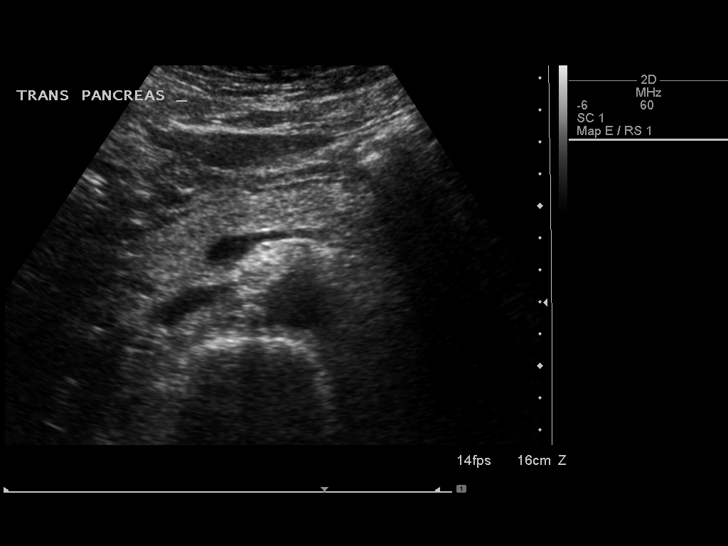
[im 61/91]
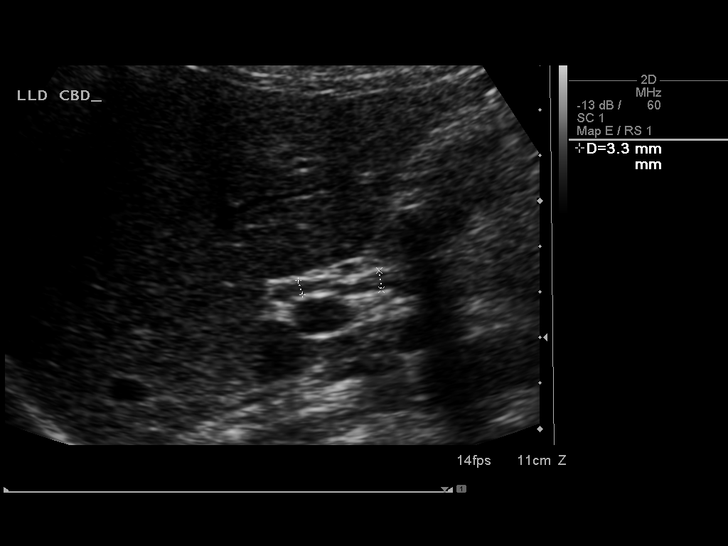
[im 68/91]
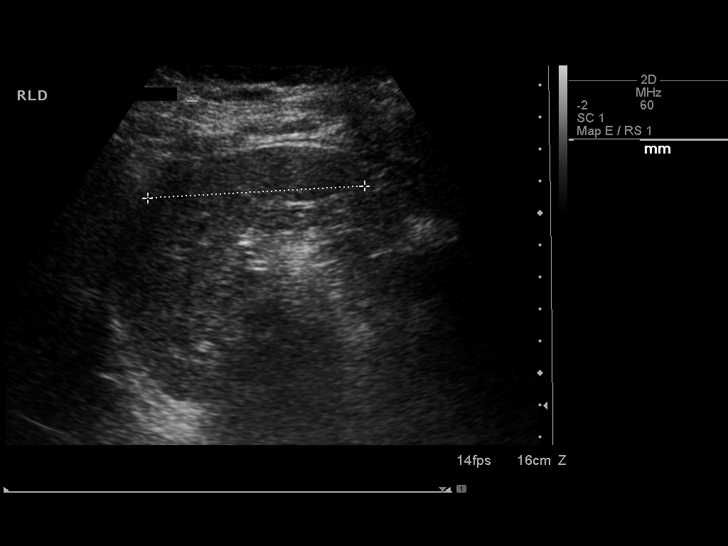
[im 76/91]
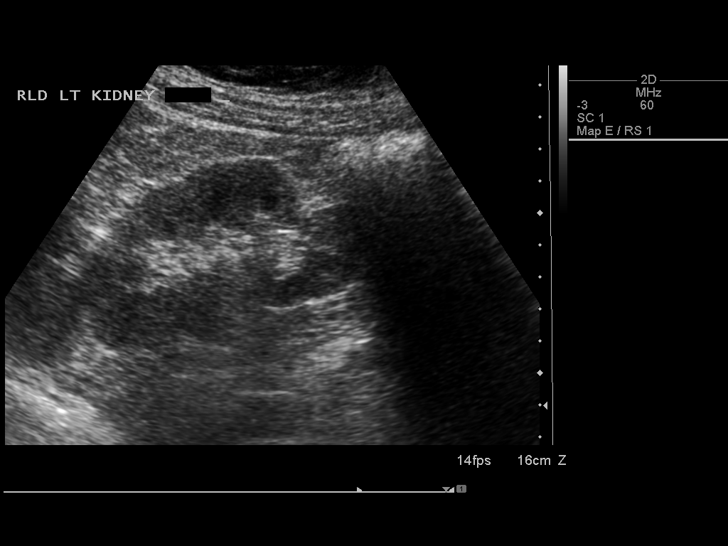
[im 83/91]
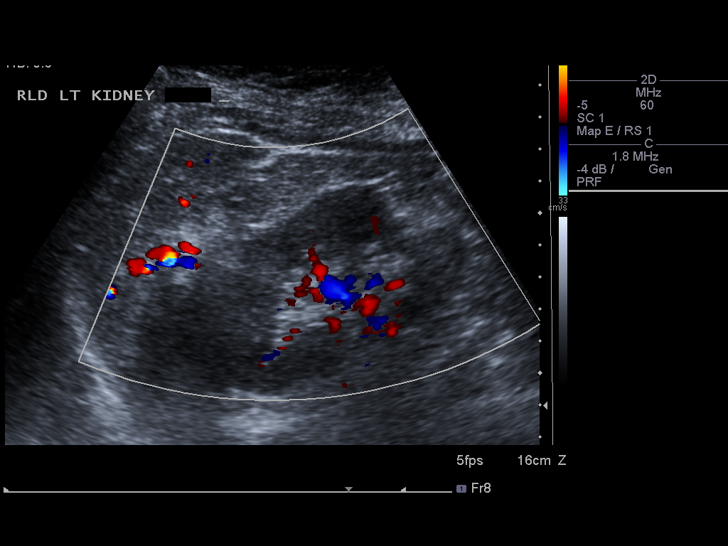
[im 91/91]
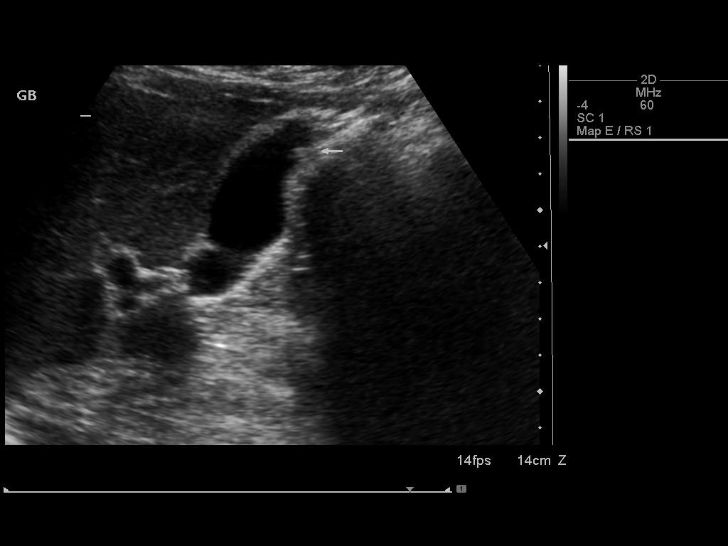

[13 of 25 positions shown; findings below may reference images not displayed]

FINDINGS: Gallbladder: Within the gallbladder there are several hyperechoic
mobile foci not associated with shadowing, likely representing
tumefactive sludge. These measure on the order of 4 mm in diameter.
No definite stones are identified. No pericholecystic fluid. No
sonographic Murphy sign. Wall thickness is normal, 2.0 mm.

Common bile duct: Diameter: 4.5 mm

Liver: No focal lesion identified. Within normal limits in
parenchymal echogenicity.

IVC: No abnormality visualized.

Pancreas: Visualized portion unremarkable.

Spleen: Size and appearance within normal limits.

Right Kidney: Length: 9.7 cm. Echogenicity within normal limits. No
mass or hydronephrosis visualized.

Left Kidney: Length: 9.9 cm. Echogenicity within normal limits. No
mass or hydronephrosis visualized.

Abdominal aorta: No aneurysm visualized.

Other findings: None.
IMPRESSION: 1. Mobile probable sludge balls within the gallbladder. No definite
stones or other evidence for acute cholecystitis.
2. No hydronephrosis.

## 2017-01-01 ENCOUNTER — Encounter: Payer: Medicare Other | Admitting: Occupational Therapy

## 2017-01-02 ENCOUNTER — Encounter: Payer: Medicare Other | Admitting: Occupational Therapy

## 2017-01-02 ENCOUNTER — Ambulatory Visit: Payer: Medicare HMO | Admitting: Occupational Therapy

## 2017-01-02 DIAGNOSIS — I890 Lymphedema, not elsewhere classified: Secondary | ICD-10-CM | POA: Diagnosis not present

## 2017-01-02 NOTE — Therapy (Signed)
[facility] [address] [place], [place], [address] Phone: [phone]   Fax:  [phone]  Occupational Therapy Treatment  Patient Details  Name: Holly CharMary J Batt MRN: [id number] Date of Birth: 05/02/1941 Referring Provider: childhood, exacerbations 2010 and 2016  Encounter Date: 01/02/2017      OT End of Session - 01/02/17 1409    Visit Number 15   Number of Visits 36   Date for OT Re-Evaluation 01/31/17   OT Start Time 0105   OT Stop Time 0201   OT Time Calculation (min) 56 min   Equipment Utilized During Treatment Lymphedema Management and Self Care Workbook- hard copy provided   Activity Tolerance Patient tolerated treatment well;No increased pain   Behavior During Therapy WFL for tasks assessed/performed      Past Medical History:  Diagnosis Date  . Arthritis   . CKD (chronic kidney disease)     saw dr Lowell Guitarpowell 2 years ago, now released from nephrology  . Complication of anesthesia 2010   "sluggish after anesthesia and had vertigo", slow to awaken after knee artroscopy 2012  . Diabetes (HCC)   . Eczema    at times  . Edema   . GERD (gastroesophageal reflux disease)   . HTN (hypertension)   . Hypercholesteremia   . Lymphedema of leg    right more than RIGHT  . Narrowing of airway 2010  . Numbness and tingling of right leg   . Osteopenia   . PONV (postoperative nausea and vomiting)    pt has n/v and vertigo after anesthesia  . Vertigo    at times, cannot turn on left side quickly or sleep on left side  . Vitamin D deficiency     Past Surgical History:  Procedure Laterality Date  . arthroscopic knee surgery Left 2012  . CARDIAC CATHETERIZATION  2012  . JOINT REPLACEMENT Right 2010  . LUMBAR LAMINECTOMY/DECOMPRESSION MICRODISCECTOMY N/A 10/22/2013   Procedure: MICROLUMBAR DECOMPRESSION LUMBAR TWO TO THREE, LUMBAR THREE TO FOUR and faramenotomy two,threeand four ,five bilateral;  Surgeon: Javier DockerJeffrey C  Beane, MD;  Location: [facility];  Service: Orthopedics;  Laterality: N/A;  . NASAL SINUS SURGERY  yrs ago    x 2  . TOTAL KNEE ARTHROPLASTY Left 03/01/2015   Procedure: LEFT TOTAL KNEE ARTHROPLASTY;  Surgeon: Ollen GrossFrank Aluisio, MD;  Location: [facility];  Service: Orthopedics;  Laterality: Left;    There were no vitals filed for this visit.      Subjective Assessment - 01/02/17 1405    Subjective  Pt presents for OT visit 15 for CDT to BLE w/ emphasis on RLE.  Pt is accompanied by her spouse. Pt is wearing compression garments to clinic today. Pt reports leg pain , L>R, radiating from hips to ankles and feet last 3-4 days without known precipitating event. Pt reports rubbing legs and moving made them feel better. She couldn't tolerate compression .   Patient is accompained by: Family member   Pertinent History d   Limitations BLE pain and swelling causes difficulty walking, difficulty w/ transfers and decreased functional mobility, decreased standing tolerance, decreased basic and instrumental ADLs performance, ( difficulty sleeping, lower body dressing, home management activities, social and community participation, decreased body image 2/2  feet and legsdisfigurement   Repetition Increases Symptoms   Patient Stated Goals decrease pain and leg swelling and keep it down so I can do more; get into a pair of  pretty boots, sleep better, do more volunteering  Currently in Pain? No/denies   Pain Onset More than a month ago                      OT Treatments/Exercises (OP) - 01/02/17 0001      ADLs   ADL Education Given Yes     Manual Therapy   Manual Therapy Edema management                OT Education - 01/02/17 1407    Education provided Yes   Education Details Cont skilled Pt and caregiver edu for donning and doffing compression garments using a variety of assistive devices to decrease physical effort required.   Person(s) Educated Patient;Spouse   Methods  Explanation;Demonstration;Tactile cues;Handout;Verbal cues   Comprehension Verbalized understanding;Returned demonstration;Verbal cues required;Tactile cues required;Need further instruction             OT Long Term Goals - 12/08/16 1225      OT LONG TERM GOAL #1   Title Lymphedema (LE) management/ self-care: Pt able to apply multi layered, gradient compression wraps with MAX caregiver assistance using proper techniques within 2 weeks to achieve optimal limb volume reduction.   Baseline dependent   Time 2   Period Weeks   Status Achieved     OT LONG TERM GOAL #2   Title Lymphedema (LE) management/ self-care:  Pt to achieve at least 10% LLE limb volume reductions bilaterally during Intensive CDT to limit LE progression, decrease infection and falls risk, to reduce pain/,  to improve safe ambulation and functional mobility, and to improve ability to fit preferred LB clothing and street shoes..   Baseline dependent   Time 12   Period Weeks   Status Achieved     OT LONG TERM GOAL #3   Title Lymphedema (LE) management/ self-care:  Pt >/= 85 % compliant with all daily, LE self-care protocols for home program w/ needed level of caregiver assistance , including simple self-manual lymphatic drainage (MLD), skin care, lymphatic pumping the ex, skin care, and donning/ doffing compression wraps and garments o limit LE progression and further functional decline.     Baseline dependent   Period Weeks   Status Achieved     OT LONG TERM GOAL #4   Title Lymphedema (LE) management/ self-care:  Pt to tolerate daily compression wraps, garments and devices in keeping w/ prescribed wear regime within 1 week of issue date to progress and retain clinical and functional gains and to limit LE progression.   Baseline dependent   Time 12   Period Weeks   Status On-going     OT LONG TERM GOAL #5   Title Lymphedema (LE) self-care:  During Management Phase CDT Pt to sustain limb volume reductions achieved  during Intensive Phase CDT within 5% utilizing LE self-care protocols, appropriate compression garments/ devices, and needed level of caregiver assistance.   Baseline dependent   Time 6   Period Months   Status On-going               Plan - 01/02/17 1409    Clinical Impression Statement Pt continues to have difficulty and require extra time to when donning class 2 sz 4 compression garments. Large sz 5 garments are on order but notr yet delivered. Pt doing well with donn n doffer for getting stockings off on her own. Using Tyvek slipper and friction pad and friction gloves gives best results for donning. OT will work on Health and safety inspector using sewing machine  to try to improve effectiveness. Pt instructed on washing and wearing custom toe cap when delivered.  Pt agrees w/ plan.   Rehab Potential Good   OT Frequency 1x / week   OT Duration 4 weeks   OT Treatment/Interventions Self-care/ADL training;Therapeutic exercise;Functional Mobility Training;Patient/family education;Manual Therapy;Manual lymph drainage;Other (comment);DME and/or AE instruction;Compression bandaging;Therapeutic activities  skin care      Patient will benefit from skilled therapeutic intervention in order to improve the following deficits and impairments:  Abnormal gait, Decreased skin integrity, Decreased knowledge of precautions, Improper body mechanics, Decreased activity tolerance, Decreased knowledge of use of DME, Impaired flexibility, Decreased balance, Decreased mobility, Difficulty walking, Impaired sensation, Decreased range of motion, Increased edema, Pain  Visit Diagnosis: Lymphedema, not elsewhere classified    Problem List Patient Active Problem List   Diagnosis Date Noted  . OA (osteoarthritis) of knee 03/01/2015  . Preoperative cardiovascular examination 01/05/2015  . Spinal stenosis of lumbar region 10/22/2013    Loel Dubonnet, MS, OTR/L, [facility] 01/02/17 2:13 PM  [facility] MAIN [facility] SERVICES [address] [place], [place], [address] Phone: [phone]   Fax:  [phone]  Name: ANASTACIA REINECKE MRN: [id number] Date of Birth: 1941-05-31

## 2017-01-04 ENCOUNTER — Encounter: Payer: Medicare Other | Admitting: Occupational Therapy

## 2017-01-05 ENCOUNTER — Encounter: Payer: Medicare HMO | Admitting: Occupational Therapy

## 2017-01-08 ENCOUNTER — Encounter: Payer: Medicare Other | Admitting: Occupational Therapy

## 2017-01-10 ENCOUNTER — Ambulatory Visit: Payer: Medicare HMO | Attending: Family Medicine | Admitting: Occupational Therapy

## 2017-01-10 DIAGNOSIS — I890 Lymphedema, not elsewhere classified: Secondary | ICD-10-CM | POA: Insufficient documentation

## 2017-01-10 NOTE — Patient Instructions (Signed)

## 2017-01-10 NOTE — Therapy (Signed)
[place] MAIN [facility] SERVICES [address] [place], [place], [address] Phone: [phone]   Fax:  [phone]  Occupational Therapy Treatment Note, Progress Report & Discharge Summary  Patient Details  Name: Holly Fields MRN: [id number] Date of Birth: 12-21-1940 Referring Provider: childhood, exacerbations 2010 and 2016  Encounter Date: 01/10/2017      OT End of Session - 01/10/17 1401    Visit Number 16   Number of Visits 36   Date for OT Re-Evaluation 01/31/17   OT Start Time 0800   OT Stop Time 0830   OT Time Calculation (min) 30 min   Equipment Utilized During Treatment Lymphedema Management and Self Care Workbook- hard copy provided   Activity Tolerance Patient tolerated treatment well;No increased pain   Behavior During Therapy WFL for tasks assessed/performed      Past Medical History:  Diagnosis Date  . Arthritis   . CKD (chronic kidney disease)     saw dr Florene Glen 2 years ago, now released from nephrology  . Complication of anesthesia 2010   "sluggish after anesthesia and had vertigo", slow to awaken after knee artroscopy 2012  . Diabetes ([place])   . Eczema    at times  . Edema   . GERD (gastroesophageal reflux disease)   . HTN (hypertension)   . Hypercholesteremia   . Lymphedema of leg    right more than RIGHT  . Narrowing of airway 2010  . Numbness and tingling of right leg   . Osteopenia   . PONV (postoperative nausea and vomiting)    pt has n/v and vertigo after anesthesia  . Vertigo    at times, cannot turn on left side quickly or sleep on left side  . Vitamin D deficiency     Past Surgical History:  Procedure Laterality Date  . arthroscopic knee surgery Left 2012  . CARDIAC CATHETERIZATION  2012  . JOINT REPLACEMENT Right 2010  . LUMBAR LAMINECTOMY/DECOMPRESSION MICRODISCECTOMY N/A 10/22/2013   Procedure: MICROLUMBAR DECOMPRESSION LUMBAR TWO TO THREE, LUMBAR THREE TO FOUR and faramenotomy two,threeand four  ,five bilateral;  Surgeon: Johnn Hai, MD;  Location: [facility];  Service: Orthopedics;  Laterality: N/A;  . NASAL SINUS SURGERY  yrs ago    x 2  . TOTAL KNEE ARTHROPLASTY Left 03/01/2015   Procedure: LEFT TOTAL KNEE ARTHROPLASTY;  Surgeon: Gaynelle Arabian, MD;  Location: [facility];  Service: Orthopedics;  Laterality: Left;    There were no vitals filed for this visit.      Subjective Assessment - 01/10/17 1358    Subjective  Pt presents for OT visit 16 for CDT to BLE w/ emphasis on RLE.  Pt is accompanied by her spouse. Pt is wearing custom compression toe cap under new Juzo ccl 2 A-G in 1 size larger than measured for ease of donning. Pt states both garments fit and function well and are comfortable to wear.,   Patient is accompained by: Family member   Limitations BLE pain and swelling causes difficulty walking, difficulty w/ transfers and decreased functional mobility, decreased standing tolerance, decreased basic and instrumental ADLs performance, ( difficulty sleeping, lower body dressing, home management activities, social and community participation, decreased body image 2/2  feet and legsdisfigurement   Repetition Increases Symptoms   Patient Stated Goals decrease pain and leg swelling and keep it down so I can do more; get into a pair of  pretty boots, sleep better, do more volunteering   Currently in Pain? No/denies  Pain Onset More than a month ago                      OT Treatments/Exercises (OP) - 01/10/17 0001      ADLs   ADL Education Given Yes     Manual Therapy   Manual Therapy Edema management   Manual therapy comments assessment of new compression garments                OT Education - 01/10/17 1400    Education provided Yes   Education Details Completed LE self care education    Person(s) Educated Patient;Spouse   Methods Explanation;Handout   Comprehension Verbalized understanding             OT Long Term Goals - 01/10/17 1402       OT LONG TERM GOAL #1   Title Lymphedema (LE) management/ self-care: Pt able to apply multi layered, gradient compression wraps with MAX caregiver assistance using proper techniques within 2 weeks to achieve optimal limb volume reduction.   Baseline dependent   Time 2   Period Weeks   Status Achieved     OT LONG TERM GOAL #2   Title Lymphedema (LE) management/ self-care:  Pt to achieve at least 10% LLE limb volume reductions bilaterally during Intensive CDT to limit LE progression, decrease infection and falls risk, to reduce pain/,  to improve safe ambulation and functional mobility, and to improve ability to fit preferred LB clothing and street shoes..   Baseline dependent   Time 12   Period Weeks   Status Achieved     OT LONG TERM GOAL #3   Title Lymphedema (LE) management/ self-care:  Pt >/= 85 % compliant with all daily, LE self-care protocols for home program w/ needed level of caregiver assistance , including simple self-manual lymphatic drainage (MLD), skin care, lymphatic pumping the ex, skin care, and donning/ doffing compression wraps and garments o limit LE progression and further functional decline.     Baseline dependent   Period Weeks   Status Achieved     OT LONG TERM GOAL #4   Title Lymphedema (LE) management/ self-care:  Pt to tolerate daily compression wraps, garments and devices in keeping w/ prescribed wear regime within 1 week of issue date to progress and retain clinical and functional gains and to limit LE progression.   Baseline dependent   Time 12   Period Weeks   Status Achieved     OT LONG TERM GOAL #5   Title Lymphedema (LE) self-care:  During Management Phase CDT Pt to sustain limb volume reductions achieved during Intensive Phase CDT within 5% utilizing LE self-care protocols, appropriate compression garments/ devices, and needed level of caregiver assistance.   Baseline dependent   Time 6   Period Months   Status On-going                Plan - 01/10/17 1405    Clinical Impression Statement Pt returns for assessment of Juzo, thigh length, class 2 ( 30-40 mmHg) in size larger (sz V, petitte w/ open toe) replacing original   sz 4 for ease of donning. Pt states she is able to don these larger stockings with less physical effort. She has somewhat easier time doffing thjese garments due to hand arthritis, but the larger size is easier by report. Her spouse is eager to help w/ garment donning and doffing whenever he is needed, although Pt remains fiercely independent with all self  care. Swelling is moderately well controlled today. Pt continues to use sequential  compression pump on alternate legs 2-3 x weekly.. Pt's spouse assists her with light compression wrap unilaterally for HOS. She can tolerate this sometimes, but at other times leg pain limits   her ability to utilize compression at night. Pt in agreement with plan to purchase a pair of knee highs in same config as thigh highs for days when she is unable to tolerate full leg compression. Pt has met all initial OT goals for CDT to BLE. Pt is in agreement with plan to DC OT today. Pt will call PRN and follow-up as needed. It has been a joy working with Mrs. Trivett and her Spouse.   Rehab Potential Good   OT Frequency Other (comment)  DC OT for CDT. All goals Met.   OT Duration 4 weeks   OT Treatment/Interventions Self-care/ADL training;Therapeutic exercise;Functional Mobility Training;Patient/family education;Manual Therapy;Manual lymph drainage;Other (comment);DME and/or AE instruction;Compression bandaging;Therapeutic activities  skin care   Plan Juzo A-G, Dynamic (3512) sz 5 petite, open toe, w/ SB   Consulted and Agree with Plan of Care Patient;Family member/caregiver      Patient will benefit from skilled therapeutic intervention in order to improve the following deficits and impairments:  Abnormal gait, Decreased skin integrity, Decreased knowledge of precautions, Improper body  mechanics, Decreased activity tolerance, Decreased knowledge of use of DME, Impaired flexibility, Decreased balance, Decreased mobility, Difficulty walking, Impaired sensation, Decreased range of motion, Increased edema, Pain  Visit Diagnosis: Lymphedema, not elsewhere classified      G-Codes - 01/26/17 1404    Functional Limitation Self care   Self Care Current Status (R5188) At least 20 percent but less than 40 percent impaired, limited or restricted   Self Care Discharge Status [phone]) At least 20 percent but less than 40 percent impaired, limited or restricted      Problem List Patient Active Problem List   Diagnosis Date Noted  . OA (osteoarthritis) of knee 03/01/2015  . Preoperative cardiovascular examination 01/05/2015  . Spinal stenosis of lumbar region 10/22/2013    Andrey Spearman, MS, OTR/L, [facility] 01/26/17 2:15 PM  [place] MAIN [facility] SERVICES [address] [place], [place], [address] Phone: [phone]   Fax:  [phone]  Name: SKYLEY GRANDMAISON MRN: [id number] Date of Birth: August 22, 1941

## 2017-01-12 ENCOUNTER — Encounter: Payer: Medicare Other | Admitting: Occupational Therapy

## 2017-01-15 ENCOUNTER — Encounter: Payer: Medicare Other | Admitting: Occupational Therapy

## 2017-01-17 ENCOUNTER — Encounter: Payer: Medicare Other | Admitting: Occupational Therapy

## 2017-01-19 ENCOUNTER — Encounter: Payer: Medicare Other | Admitting: Occupational Therapy

## 2017-01-22 ENCOUNTER — Encounter: Payer: Medicare Other | Admitting: Occupational Therapy

## 2017-01-24 ENCOUNTER — Encounter: Payer: Medicare Other | Admitting: Occupational Therapy

## 2017-01-26 ENCOUNTER — Encounter: Payer: Medicare Other | Admitting: Occupational Therapy

## 2017-01-31 DIAGNOSIS — Z1231 Encounter for screening mammogram for malignant neoplasm of breast: Secondary | ICD-10-CM | POA: Diagnosis not present

## 2017-02-07 DIAGNOSIS — Z6836 Body mass index (BMI) 36.0-36.9, adult: Secondary | ICD-10-CM | POA: Diagnosis not present

## 2017-02-07 DIAGNOSIS — I129 Hypertensive chronic kidney disease with stage 1 through stage 4 chronic kidney disease, or unspecified chronic kidney disease: Secondary | ICD-10-CM | POA: Diagnosis not present

## 2017-02-07 DIAGNOSIS — N183 Chronic kidney disease, stage 3 (moderate): Secondary | ICD-10-CM | POA: Diagnosis not present

## 2017-03-19 DIAGNOSIS — R194 Change in bowel habit: Secondary | ICD-10-CM | POA: Diagnosis not present

## 2017-03-19 DIAGNOSIS — R1012 Left upper quadrant pain: Secondary | ICD-10-CM | POA: Diagnosis not present

## 2017-03-19 DIAGNOSIS — I890 Lymphedema, not elsewhere classified: Secondary | ICD-10-CM | POA: Diagnosis not present

## 2017-03-19 DIAGNOSIS — M810 Age-related osteoporosis without current pathological fracture: Secondary | ICD-10-CM | POA: Diagnosis not present

## 2017-03-19 DIAGNOSIS — M48061 Spinal stenosis, lumbar region without neurogenic claudication: Secondary | ICD-10-CM | POA: Diagnosis not present

## 2017-03-30 DIAGNOSIS — R1012 Left upper quadrant pain: Secondary | ICD-10-CM | POA: Diagnosis not present

## 2017-03-30 DIAGNOSIS — R198 Other specified symptoms and signs involving the digestive system and abdomen: Secondary | ICD-10-CM | POA: Diagnosis not present

## 2017-04-12 ENCOUNTER — Ambulatory Visit (INDEPENDENT_AMBULATORY_CARE_PROVIDER_SITE_OTHER): Payer: Medicare HMO | Admitting: Internal Medicine

## 2017-04-12 ENCOUNTER — Encounter: Payer: Self-pay | Admitting: Internal Medicine

## 2017-04-12 VITALS — BP 120/82 | HR 101 | Ht 62.5 in | Wt 194.0 lb

## 2017-04-12 DIAGNOSIS — M810 Age-related osteoporosis without current pathological fracture: Secondary | ICD-10-CM

## 2017-04-12 LAB — BASIC METABOLIC PANEL WITH GFR
BUN: 23 mg/dL (ref 7–25)
CHLORIDE: 103 mmol/L (ref 98–110)
CO2: 24 mmol/L (ref 20–31)
Calcium: 9.7 mg/dL (ref 8.6–10.4)
Creat: 1.68 mg/dL — ABNORMAL HIGH (ref 0.60–0.93)
GFR, Est African American: 34 mL/min — ABNORMAL LOW (ref >=60)
GFR, Est Non African American: 30 mL/min — ABNORMAL LOW (ref >=60)
GLUCOSE: 91 mg/dL (ref 65–99)
Potassium: 4.2 mmol/L (ref 3.5–5.3)
Sodium: 139 mmol/L (ref 135–146)

## 2017-04-12 LAB — VITAMIN D 25 HYDROXY (VIT D DEFICIENCY, FRACTURES): VITD: 46.5 ng/mL (ref 30.00–100.00)

## 2017-04-12 NOTE — Progress Notes (Signed)
Patient ID: Holly Fields, female   DOB: [birth year], 76 y.o.   MRN: [id number]    HPI  Holly Fields is a 76 y.o.-year-old female, referred by her PCP, Dr. Drema Dallas, for management of osteoporosis. She is here with her husband who offers part of the history.  Pt was dx with OP in 2015. Previous osteopenia.  I reviewed pt's DEXA scans: Date L1-L4 T score FN T score  09/04/2016 [facility]) -2.5 (-7.9%*) RFN: -2.3 LFN: -2.2  08/18/2014 ([place]) -1.8 RFN: -2.5 LFN: -2.1   She denies fractures or falls.  No dizziness/vertigo/orthostasis/poor vision.  Previous OP treatments:  - Fosamax >> started 10/2016 >> swelling on tongue, lips + dysphagia + skin rash (painful) + generalized pain  She does have a h/o vitamin D deficiency. Reviewed available recent vit D levels >> normal: 08/29/2016: 48.2 04/25/2016: 53.1 08/26/2015: 45.9 No results found for: VD25OH  Pt is on vitamin D 2000 units 3x a week since 2016. She also eats dairy and green, leafy, vegetables.   No weight bearing exercises. 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 She also has lymphedema. Legs hurt at night. She has been seen in pain clinic before. She is not stable on her feet.  She does not take high vitamin A doses.  She had multiple steroid inj in knees up to 2016, when she had the last TKR.  Menopause was at 76 y/o.   Pt does not have a FH of osteoporosis. Mother is 65 >> no OP.  No h/o hyper/hypocalcemia or hyperparathyroidism. No h/o kidney stones. 08/2016: 9.9 Lab Results  Component Value Date   CALCIUM 8.7 03/03/2015   CALCIUM 8.6 03/02/2015   CALCIUM 9.4 02/18/2015   CALCIUM 9.1 10/23/2013   CALCIUM 9.8 10/16/2013   CALCIUM 8.6 02/12/2010   CALCIUM 8.3 (L) 02/11/2010   CALCIUM 8.4 02/10/2010   CALCIUM 8.5 02/09/2010   CALCIUM 8.4 02/08/2010   No h/o thyrotoxicosis. 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 She sees Dr. Florene Glen. Last BUN/Cr: 08/2016: 25/1.52; eGFR 41 Lab Results  Component Value Date   BUN 18 03/03/2015   CREATININE 1.39 (H) 03/03/2015   She also has a history of GERD with history of reflux esophagitis.  ROS: Constitutional: no weight gain/loss, + fatigue, no subjective hyperthermia/hypothermia, + poor sleep Eyes: no blurry vision, no xerophthalmia ENT: no sore throat, no nodules palpated in throat, no dysphagia/odynophagia, no hoarseness Cardiovascular: no CP/SOB/palpitations/++leg swelling (lymphedema bilaterally) Respiratory: no cough/SOB Gastrointestinal: no N/V/D/+ C/+ heartburn Musculoskeletal: + see history of present illness Skin: + Rash Neurological: no tremors/numbness/tingling/dizziness Psychiatric: no depression/anxiety  Past Medical History:  Diagnosis Date  . Arthritis   . CKD (chronic kidney disease)     saw dr Florene Glen 2 years ago, now released from nephrology  . Complication of anesthesia 2010   "sluggish after anesthesia and had vertigo", slow to awaken after knee artroscopy 2012  . Diabetes ([place])   . Eczema    at times  . Edema   . GERD (gastroesophageal reflux disease)   . HTN (hypertension)   . Hypercholesteremia   . Lymphedema of leg    right more than RIGHT  . Narrowing of airway 2010  . Numbness and tingling of right leg   . Osteopenia   . PONV (postoperative nausea and vomiting)  pt has n/v and vertigo after anesthesia  . Vertigo    at times, cannot turn on left side quickly or sleep on left side  . Vitamin D deficiency    Past Surgical History:  Procedure Laterality Date  . arthroscopic knee surgery Left 2012  . CARDIAC CATHETERIZATION  2012  . JOINT REPLACEMENT Right 2010  . LUMBAR LAMINECTOMY/DECOMPRESSION MICRODISCECTOMY N/A 10/22/2013   Procedure: MICROLUMBAR DECOMPRESSION LUMBAR TWO TO THREE, LUMBAR THREE TO FOUR and faramenotomy  two,threeand four ,five bilateral;  Surgeon: Johnn Hai, MD;  Location: [facility];  Service: Orthopedics;  Laterality: N/A;  . NASAL SINUS SURGERY  yrs ago    x 2  . TOTAL KNEE ARTHROPLASTY Left 03/01/2015   Procedure: LEFT TOTAL KNEE ARTHROPLASTY;  Surgeon: Gaynelle Arabian, MD;  Location: [facility];  Service: Orthopedics;  Laterality: Left;   Social History   Social History  . Marital status: Married    Spouse name: N/A  . Number of children: 3   Occupational History  . 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 Smoking status: Former Smoker    Packs/day: 0.25    Years: 15.00    Types: Cigarettes    Quit date: 12/04/1976  . Smokeless tobacco: Never Used  . Alcohol use Yes     Comment: very occasional wine  . Drug use: No   Current Outpatient Prescriptions on File Prior to Visit  Medication Sig Dispense Refill  . atorvastatin (LIPITOR) 10 MG tablet Take 10 mg by mouth every morning.     . docusate sodium (COLACE) 100 MG capsule Take 1 capsule (100 mg total) by mouth 2 (two) times daily. 20 capsule 0  . fexofenadine (ALLEGRA) 180 MG tablet Take 90 mg by mouth as needed for allergies. Takes 1/2 tablet    . fluticasone (FLONASE) 50 MCG/ACT nasal spray Place 2 sprays into the nose as needed for allergies.     . hydrochlorothiazide (HYDRODIURIL) 25 MG tablet Take 25 mg by mouth every morning.     [name] Kitchen JANUVIA 50 MG tablet Take 50 mg by mouth every morning.     [name] Kitchen lisinopril (PRINIVIL,ZESTRIL) 5 MG tablet Take 5 mg by mouth every morning.     . pantoprazole (PROTONIX) 40 MG tablet Take 40 mg by mouth daily.    [name] Kitchen tetrahydrozoline 0.05 % ophthalmic solution Place 1 drop into both eyes 2 (two) times daily as needed (Allergies).    [name] Kitchen HYDROcodone-acetaminophen (NORCO) 7.5-325 MG per tablet Take 1-2 tablets by mouth every 6 (six) hours as needed for moderate pain. (Patient not taking: Reported on 04/12/2017) 60 tablet 0  . methocarbamol (ROBAXIN) 500 MG tablet Take 1 tablet (500 mg total) by  mouth every 6 (six) hours as needed for muscle spasms. (Patient not taking: Reported on 04/12/2017) 40 tablet 1  . ondansetron (ZOFRAN) 4 MG tablet Take 1 tablet (4 mg total) by mouth every 6 (six) hours as needed for nausea. (Patient not taking: Reported on 04/12/2017) 30 tablet 0  . rivaroxaban (XARELTO) 10 MG TABS tablet Take 1 tablet (10 mg total) by mouth daily with breakfast. (Patient not taking: Reported on 04/12/2017) 19 tablet 0   No current facility-administered medications on file prior to visit.    Allergies  Allergen Reactions  . Adenosine      bp plummets  . Augmentin [Amoxicillin-Pot Clavulanate] Hives  . Codeine Nausea And Vomiting  . Doxycycline Hives  . Metformin And Related     diarrhea  .  Oxycodone Nausea And Vomiting  . Tramadol Nausea And Vomiting   Family History  Problem Relation Age of Onset  . Arrhythmia Father   . Arrhythmia Mother   . Heart attack Father   . Heart failure Father   . Hyperlipidemia Mother   . Hypertension Mother     PE: BP 120/82 (BP Location: Left Arm, Patient Position: Sitting)   Pulse (!) 101   Ht 5' 2.5" (1.588 m)   Wt 194 lb (88 kg)   SpO2 97%   BMI 34.92 kg/m  Wt Readings from Last 3 Encounters:  04/12/17 194 lb (88 kg)  03/01/15 185 lb (83.9 kg)  02/18/15 185 lb (83.9 kg)   Constitutional: overweight, in NAD. No kyphosis. Eyes: PERRLA, EOMI, no exophthalmos ENT: moist mucous membranes, no thyromegaly, no cervical lymphadenopathy Cardiovascular: RRR, No MRG, + B signif. lymphedema Respiratory: CTA B Gastrointestinal: abdomen soft, NT, ND, BS+ Musculoskeletal: no deformities, strength intact in all 4; No pain at spine percussion Skin: moist, warm, no rashes Neurological: no tremor with outstretched hands, DTR normal in all 4  Assessment: 1. Osteoporosis  Plan: 1. Osteoporosis - likely postmenopausal + 2/2 steroid inj's. - Discussed about increased risk of fracture, depending on the T score, greatly increased when  the T score is lower than -2.5, but it is actually a continuum and -2.5 should not be regarded as an absolute threshold. We reviewed the reports and images from her last DEXA scan and the report of the 2015 DEXA scan, and I explained that based on the T scores, she has an increased risk for fractures.  - we reviewed her dietary and supplemental calcium and vitamin D intake. She does appear to get an appropriate amount of calcium daily from the diet (1000-1200 mg) and I will check vit D today to see if she needs to increase her supplementation (she takes ~1000 IU daily now) - given her specific instructions about food sources for Calcium and Vitamin D - see pt instructions  - discussed fall precautions   - given handout from [place] Re: weight bearing exercises - advised to do this every day or at least 5/7 days - I also recommended to look into skeletal loading (OsteoStrong) - given brochure and discussed possible outcomes   - we discussed about maintaining a good amount of protein in her diet. The recommended daily protein intake is ~0.8 g per kilogram per day (for her ~70g). I advised her to try to aim for this amount, since a diet low in proteins can exacerbate osteoporosis. Also, avoid smoking or >2 drinks of alcohol a day. - We discussed about the different medication classes, benefits and side effects (including atypical fractures and ONJ - no dental workup in progress or planned). Since she had an intolerance to Fosamax,  I recommended sq denosumab (Prolia) for 3-6 years. We can then continue with  zoledronic acid (iv Reclast) for 1-2 years. I would use Teriparatide as a last resort. I explained the mechanism of action and expected benefits.  - Since she has a history of CKD, I will check a PTH level along with the rest of the labs today.Prolia is approved for GFR less than 30, so most likely we can continue with the plan to start this - today, we will check: Orders Placed  This Encounter  Procedures  . VITAMIN D 25 Hydroxy (Vit-D Deficiency, Fractures)  . Parathyroid hormone, intact (no Ca)  . BASIC METABOLIC PANEL WITH GFR  - if  labs normal, will arrange for a Prolia inj - will check a new DEXA scan in 2 years after starting Prolia -  I explained that the first indication that the treatment is working is her not having anymore fractures. DEXA scan changes are secondary: unchanged or slightly higher T-scores are desirable - will see pt back in a year  Component     Latest Ref Rng & Units 04/12/2017  Sodium     135 - 146 mmol/L 139  Potassium     3.5 - 5.3 mmol/L 4.2  Chloride     98 - 110 mmol/L 103  CO2     20 - 31 mmol/L 24  Glucose     65 - 99 mg/dL 91  BUN     7 - 25 mg/dL 23  Creatinine     0.60 - 0.93 mg/dL 1.68 (H)  Calcium     8.6 - 10.4 mg/dL 9.7  GFR, Est African American     >=60 mL/min 34 (L)  GFR, Est Non African American     >=60 mL/min 30 (L)  VITD     30.00 - 100.00 ng/mL 46.50  PTH     15 - 65 pg/mL 39   GFR lower. PTH normal, as is her vitamin D. We can continue with Prolia as discussed.   Philemon Kingdom, MD PhD [facility]

## 2017-04-12 NOTE — Patient Instructions (Signed)
Please stop at the lab.  Please continue vitamin D 2000 units daily.  Switch to almond milk.  We will start a PA for Prolia.  How Can I Prevent Falls? Men and women with osteoporosis need to take care not to fall down. Falls can break bones. Some reasons people fall are: Poor vision  Poor balance  Certain diseases that affect how you walk  Some types of medicine, such as sleeping pills.  Some tips to help prevent falls outdoors are: Use a cane or walker  Wear rubber-soled shoes so you don't slip  Walk on grass when sidewalks are slippery  In winter, put salt or kitty litter on icy sidewalks.  Some ways to help prevent falls indoors are: Keep rooms free of clutter, especially on floors  Use plastic or carpet runners on slippery floors  Wear low-heeled shoes that provide good support  Do not walk in socks, stockings, or slippers  Be sure carpets and area rugs have skid-proof backs or are tacked to the floor  Be sure stairs are well lit and have rails on both sides  Put grab bars on bathroom walls near tub, shower, and toilet  Use a rubber bath mat in the shower or tub  Keep a flashlight next to your bed  Use a sturdy step stool with a handrail and wide steps  Add more lights in rooms (and night lights) Buy a cordless phone to keep with you so that you don't have to rush to the phone       when it rings and so that you can call for help if you fall.   (adapted from http://www.niams.NightlifePreviews.se)  Dietary sources of calcium and vitamin D:  Calcium content (mg) - http://www.niams.MoviePins.co.za  Fortified oatmeal, 1 packet 350  Sardines, canned in oil, with edible bones, 3 oz. 324  Cheddar cheese, 1 oz. shredded 306  Milk, nonfat, 1 cup 302  Milkshake, 1 cup 300  Yogurt, plain, low-fat, 1 cup 300  Soybeans, cooked, 1 cup 261  Tofu, firm, with calcium,  cup 204  Orange juice, fortified with calcium, 6 oz.  200-260 (varies)  Salmon, canned, with edible bones, 3 oz. 181  Pudding, instant, made with 2% milk,  cup 153  Baked beans, 1 cup [place], 1% milk fat, 1 cup 138  Spaghetti, lasagna, 1 cup 125  Frozen yogurt, vanilla, soft-serve,  cup 103  Ready-to-eat cereal, fortified with calcium, 1 cup 100-1,000 (varies)  Cheese pizza, 1 slice 295  Fortified waffles, 2 100  Turnip greens, boiled,  cup 99  Broccoli, raw, 1 cup 90  Ice cream, vanilla,  cup 85  Soy or rice milk, fortified with calcium, 1 cup 80-500 (varies)   Vitamin D content (International Units, IU) - https://www.ars.usda.gov Cod liver oil, 1 tablespoon 1,360  Swordfish, cooked, 3 oz 566  Salmon (sockeye), cooked, 3 oz 447  Tuna fish, canned in water, drained, 3 oz 154  Orange juice fortified with vitamin D, 1 cup (check product labels, as amount of added vitamin D varies) 137  Milk, nonfat, reduced fat, and whole, vitamin D-fortified, 1 cup 115-124  Yogurt, fortified with 20% of the daily value for vitamin D, 6 oz 80  Margarine, fortified, 1 tablespoon 60  Sardines, canned in oil, drained, 2 sardines 46  Liver, beef, cooked, 3 oz 42  Egg, 1 large (vitamin D is found in yolk) 41  Ready-to-eat cereal, fortified with 10% of the daily value for vitamin D, 0.75-1 cup  40  Cheese, Swiss, 1 oz 6   Exercise for Strong Bones (from [place]) There are two types of exercises that are important for building and maintaining bone density:  weight-bearing and muscle-strengthening exercises. Weight-bearing Exercises These exercises include activities that make you move against gravity while staying upright. Weight-bearing exercises can be high-impact or low-impact. High-impact weight-bearing exercises help build bones and keep them strong. If you have broken a bone due to osteoporosis or are at risk of breaking a bone, you may need to avoid high-impact exercises. If you're not sure, you should check with  your healthcare provider. Examples of high-impact weight-bearing exercises are: . Dancing . Doing high-impact aerobics . Hiking . Jogging/running . Jumping Rope . Stair climbing . Tennis Low-impact weight-bearing exercises can also help keep bones strong and are a safe alternative if you cannot do high-impact exercises. Examples of low-impact weight-bearing exercises are: . Using elliptical training machines . Doing low-impact aerobics . Using stair-step machines . Fast walking on a treadmill or outside Muscle-Strengthening Exercises These exercises include activities where you move your body, a weight or some other resistance against gravity. They are also known as resistance exercises and include: . Lifting weights . Using elastic exercise bands . Using weight machines . Lifting your own body weight . Functional movements, such as standing and rising up on your toes Yoga and Pilates can also improve strength, balance and flexibility. However, certain positions may not be safe for people with osteoporosis or those at increased risk of broken bones. For example, exercises that have you bend forward may increase the chance of breaking a bone in the spine. A physical therapist should be able to help you learn which exercises are safe and appropriate for you. Non-Impact Exercises Non-impact exercises can help you to improve balance, posture and how well you move in everyday activities. These exercises can also help to increase muscle strength and decrease the risk of falls and broken bones. Some of these exercises include: . Balance exercises that strengthen your legs and test your balance, such as Tai Chi, can decrease your risk of falls. . Posture exercises that improve your posture and reduce rounded or "sloping" shoulders can help you decrease the chance of breaking a bone, especially in the spine. . Functional exercises that improve how well you move can help you with everyday activities  and decrease your chance of falling and breaking a bone. For example, if you have trouble getting up from a chair or climbing stairs, you should do these activities as exercises. A physical therapist can teach you balance, posture and functional exercises. Starting a New Exercise Program If you haven't exercised regularly for a while, check with your healthcare provider before beginning a new exercise program-particularly if you have health problems such as heart disease, diabetes or high blood pressure. If you're at high risk of breaking a bone, you should work with a physical therapist to develop a safe exercise program. Once you have your healthcare provider's approval, start slowly. If you've already broken bones in the spine because of osteoporosis, be very careful to avoid activities that require reaching down, bending forward, rapid twisting motions, heavy lifting and those that increase your chance of a fall. As you get started, your muscles may feel sore for a day or two after you exercise. If soreness lasts longer, you may be working too hard and need to ease up. Exercises should be done in a pain-free range of motion. How Much Exercise Do  You Need? Weight-bearing exercises 30 minutes on most days of the week. Do a 30-minutesession or multiple sessions spread out throughout the day. The benefits to your bones are the same.   Muscle-strengthening exercises Two to three days per week. If you don't have much time for strengthening/resistance training, do small amounts at a time. You can do just one body part each day. For example do arms one day, legs the next and trunk the next. You can also spread these exercises out during your normal day.  Balance, posture and functional exercises Every day or as often as needed. You may want to focus on one area more than the others. If you have fallen or lose your balance, spend time doing balance exercises. If you are getting rounded shoulders, work more on  posture exercises. If you have trouble climbing stairs or getting up from the couch, do more functional exercises. You can also perform these exercises at one time or spread them during your day. Work with a phyiscal therapist to learn the right exercises for you.   Please return in 1 year.

## 2017-04-13 ENCOUNTER — Telehealth: Payer: Self-pay

## 2017-04-13 DIAGNOSIS — M810 Age-related osteoporosis without current pathological fracture: Secondary | ICD-10-CM | POA: Insufficient documentation

## 2017-04-13 LAB — PARATHYROID HORMONE, INTACT (NO CA): PTH: 39 pg/mL (ref 15–65)

## 2017-04-13 NOTE — Telephone Encounter (Signed)
Called patient, advised of labs. She does see Dr.Powell with nephrology. I will fax over last OV note.  Megan,  Can we initiate PA for this patient? Thank you!

## 2017-04-13 NOTE — Telephone Encounter (Signed)
-----   Message from Carlus Pavlovristina Gherghe, MD sent at 04/13/2017 12:23 PM EDT ----- Raynelle FanningJulie, can you please call pt: Kidney fxn is lower - Is she still seeing nephrology?  (if so >> can you please fax him my OV note?). Her vitamin D and parathyroid hormone are normal. We can continue with Prolia as discussed >> we can start the PA.

## 2017-04-19 DIAGNOSIS — I890 Lymphedema, not elsewhere classified: Secondary | ICD-10-CM | POA: Diagnosis not present

## 2017-04-19 DIAGNOSIS — M5416 Radiculopathy, lumbar region: Secondary | ICD-10-CM | POA: Diagnosis not present

## 2017-04-19 DIAGNOSIS — G629 Polyneuropathy, unspecified: Secondary | ICD-10-CM | POA: Diagnosis not present

## 2017-04-19 DIAGNOSIS — Z96652 Presence of left artificial knee joint: Secondary | ICD-10-CM | POA: Diagnosis not present

## 2017-04-19 DIAGNOSIS — G8929 Other chronic pain: Secondary | ICD-10-CM | POA: Diagnosis not present

## 2017-04-19 DIAGNOSIS — Z9889 Other specified postprocedural states: Secondary | ICD-10-CM | POA: Diagnosis not present

## 2017-04-19 DIAGNOSIS — Z7189 Other specified counseling: Secondary | ICD-10-CM | POA: Diagnosis not present

## 2017-04-19 DIAGNOSIS — M5442 Lumbago with sciatica, left side: Secondary | ICD-10-CM | POA: Diagnosis not present

## 2017-05-01 DIAGNOSIS — M7602 Gluteal tendinitis, left hip: Secondary | ICD-10-CM | POA: Diagnosis not present

## 2017-05-01 DIAGNOSIS — M47817 Spondylosis without myelopathy or radiculopathy, lumbosacral region: Secondary | ICD-10-CM | POA: Diagnosis not present

## 2017-05-01 DIAGNOSIS — M5417 Radiculopathy, lumbosacral region: Secondary | ICD-10-CM | POA: Diagnosis not present

## 2017-05-01 DIAGNOSIS — M9903 Segmental and somatic dysfunction of lumbar region: Secondary | ICD-10-CM | POA: Diagnosis not present

## 2017-05-01 DIAGNOSIS — M5137 Other intervertebral disc degeneration, lumbosacral region: Secondary | ICD-10-CM | POA: Diagnosis not present

## 2017-05-02 DIAGNOSIS — M4607 Spinal enthesopathy, lumbosacral region: Secondary | ICD-10-CM | POA: Diagnosis not present

## 2017-05-02 DIAGNOSIS — M62830 Muscle spasm of back: Secondary | ICD-10-CM | POA: Diagnosis not present

## 2017-05-02 DIAGNOSIS — M5137 Other intervertebral disc degeneration, lumbosacral region: Secondary | ICD-10-CM | POA: Diagnosis not present

## 2017-05-02 DIAGNOSIS — M545 Low back pain: Secondary | ICD-10-CM | POA: Diagnosis not present

## 2017-05-02 DIAGNOSIS — M5417 Radiculopathy, lumbosacral region: Secondary | ICD-10-CM | POA: Diagnosis not present

## 2017-05-02 DIAGNOSIS — M5387 Other specified dorsopathies, lumbosacral region: Secondary | ICD-10-CM | POA: Diagnosis not present

## 2017-05-02 DIAGNOSIS — M47817 Spondylosis without myelopathy or radiculopathy, lumbosacral region: Secondary | ICD-10-CM | POA: Diagnosis not present

## 2017-05-02 DIAGNOSIS — M9903 Segmental and somatic dysfunction of lumbar region: Secondary | ICD-10-CM | POA: Diagnosis not present

## 2017-05-03 DIAGNOSIS — M9903 Segmental and somatic dysfunction of lumbar region: Secondary | ICD-10-CM | POA: Diagnosis not present

## 2017-05-03 DIAGNOSIS — M5417 Radiculopathy, lumbosacral region: Secondary | ICD-10-CM | POA: Diagnosis not present

## 2017-05-03 DIAGNOSIS — M5137 Other intervertebral disc degeneration, lumbosacral region: Secondary | ICD-10-CM | POA: Diagnosis not present

## 2017-05-03 DIAGNOSIS — M47817 Spondylosis without myelopathy or radiculopathy, lumbosacral region: Secondary | ICD-10-CM | POA: Diagnosis not present

## 2017-05-07 DIAGNOSIS — M5136 Other intervertebral disc degeneration, lumbar region: Secondary | ICD-10-CM | POA: Diagnosis not present

## 2017-05-07 DIAGNOSIS — M5417 Radiculopathy, lumbosacral region: Secondary | ICD-10-CM | POA: Diagnosis not present

## 2017-05-07 DIAGNOSIS — M47817 Spondylosis without myelopathy or radiculopathy, lumbosacral region: Secondary | ICD-10-CM | POA: Diagnosis not present

## 2017-05-07 DIAGNOSIS — M47816 Spondylosis without myelopathy or radiculopathy, lumbar region: Secondary | ICD-10-CM | POA: Diagnosis not present

## 2017-05-07 DIAGNOSIS — M5137 Other intervertebral disc degeneration, lumbosacral region: Secondary | ICD-10-CM | POA: Diagnosis not present

## 2017-05-07 DIAGNOSIS — M62830 Muscle spasm of back: Secondary | ICD-10-CM | POA: Diagnosis not present

## 2017-05-07 DIAGNOSIS — M9903 Segmental and somatic dysfunction of lumbar region: Secondary | ICD-10-CM | POA: Diagnosis not present

## 2017-05-07 DIAGNOSIS — M7602 Gluteal tendinitis, left hip: Secondary | ICD-10-CM | POA: Diagnosis not present

## 2017-05-08 DIAGNOSIS — M5417 Radiculopathy, lumbosacral region: Secondary | ICD-10-CM | POA: Diagnosis not present

## 2017-05-08 DIAGNOSIS — M47817 Spondylosis without myelopathy or radiculopathy, lumbosacral region: Secondary | ICD-10-CM | POA: Diagnosis not present

## 2017-05-08 DIAGNOSIS — M9903 Segmental and somatic dysfunction of lumbar region: Secondary | ICD-10-CM | POA: Diagnosis not present

## 2017-05-08 DIAGNOSIS — M5387 Other specified dorsopathies, lumbosacral region: Secondary | ICD-10-CM | POA: Diagnosis not present

## 2017-05-08 DIAGNOSIS — M5137 Other intervertebral disc degeneration, lumbosacral region: Secondary | ICD-10-CM | POA: Diagnosis not present

## 2017-05-08 DIAGNOSIS — M4607 Spinal enthesopathy, lumbosacral region: Secondary | ICD-10-CM | POA: Diagnosis not present

## 2017-05-09 ENCOUNTER — Telehealth: Payer: Self-pay | Admitting: *Deleted

## 2017-05-09 ENCOUNTER — Telehealth: Payer: Self-pay

## 2017-05-09 NOTE — Telephone Encounter (Signed)
Verification of benefits have been processed and an approval has been received for pts prolia injection. Pts estimated cost are appx $250. Pt also has $183 deductible, that she is required to meet if she has not already done so. This is only an estimate and cannot be confirmed until benefits are paid. Please advise pt and schedule if needed. If scheduled, once the injection is received, pls contact me back with the date it was received so that I am able to update prolia folder. thanks

## 2017-05-09 NOTE — Telephone Encounter (Signed)
Called and notified patient of Prolia information. Advised to call back if any questions, and when she would want to schedule. Left call back number.   

## 2017-05-09 NOTE — Telephone Encounter (Signed)
Called and notified patient of Prolia information. Advised to call back if any questions, and when she would want to schedule. Left call back number.

## 2017-05-10 ENCOUNTER — Telehealth: Payer: Self-pay | Admitting: Internal Medicine

## 2017-05-10 DIAGNOSIS — M47817 Spondylosis without myelopathy or radiculopathy, lumbosacral region: Secondary | ICD-10-CM | POA: Diagnosis not present

## 2017-05-10 DIAGNOSIS — M5417 Radiculopathy, lumbosacral region: Secondary | ICD-10-CM | POA: Diagnosis not present

## 2017-05-10 DIAGNOSIS — M7602 Gluteal tendinitis, left hip: Secondary | ICD-10-CM | POA: Diagnosis not present

## 2017-05-10 DIAGNOSIS — M5137 Other intervertebral disc degeneration, lumbosacral region: Secondary | ICD-10-CM | POA: Diagnosis not present

## 2017-05-10 DIAGNOSIS — M9903 Segmental and somatic dysfunction of lumbar region: Secondary | ICD-10-CM | POA: Diagnosis not present

## 2017-05-10 NOTE — Telephone Encounter (Signed)
error 

## 2017-05-14 DIAGNOSIS — M9903 Segmental and somatic dysfunction of lumbar region: Secondary | ICD-10-CM | POA: Diagnosis not present

## 2017-05-14 DIAGNOSIS — M5137 Other intervertebral disc degeneration, lumbosacral region: Secondary | ICD-10-CM | POA: Diagnosis not present

## 2017-05-14 DIAGNOSIS — M47817 Spondylosis without myelopathy or radiculopathy, lumbosacral region: Secondary | ICD-10-CM | POA: Diagnosis not present

## 2017-05-14 DIAGNOSIS — M5417 Radiculopathy, lumbosacral region: Secondary | ICD-10-CM | POA: Diagnosis not present

## 2017-05-15 ENCOUNTER — Ambulatory Visit (INDEPENDENT_AMBULATORY_CARE_PROVIDER_SITE_OTHER): Payer: Medicare HMO

## 2017-05-15 DIAGNOSIS — M810 Age-related osteoporosis without current pathological fracture: Secondary | ICD-10-CM

## 2017-05-15 MED ORDER — DENOSUMAB 60 MG/ML ~~LOC~~ SOLN
60.0000 mg | Freq: Once | SUBCUTANEOUS | Status: AC
  Administered 2017-05-15: 60 mg via SUBCUTANEOUS

## 2017-05-16 DIAGNOSIS — M47817 Spondylosis without myelopathy or radiculopathy, lumbosacral region: Secondary | ICD-10-CM | POA: Diagnosis not present

## 2017-05-16 DIAGNOSIS — M9903 Segmental and somatic dysfunction of lumbar region: Secondary | ICD-10-CM | POA: Diagnosis not present

## 2017-05-16 DIAGNOSIS — M5387 Other specified dorsopathies, lumbosacral region: Secondary | ICD-10-CM | POA: Diagnosis not present

## 2017-05-16 DIAGNOSIS — M5137 Other intervertebral disc degeneration, lumbosacral region: Secondary | ICD-10-CM | POA: Diagnosis not present

## 2017-05-16 DIAGNOSIS — M5417 Radiculopathy, lumbosacral region: Secondary | ICD-10-CM | POA: Diagnosis not present

## 2017-05-16 DIAGNOSIS — M4607 Spinal enthesopathy, lumbosacral region: Secondary | ICD-10-CM | POA: Diagnosis not present

## 2017-05-17 DIAGNOSIS — M47817 Spondylosis without myelopathy or radiculopathy, lumbosacral region: Secondary | ICD-10-CM | POA: Diagnosis not present

## 2017-05-17 DIAGNOSIS — M7602 Gluteal tendinitis, left hip: Secondary | ICD-10-CM | POA: Diagnosis not present

## 2017-05-17 DIAGNOSIS — M5417 Radiculopathy, lumbosacral region: Secondary | ICD-10-CM | POA: Diagnosis not present

## 2017-05-17 DIAGNOSIS — M5137 Other intervertebral disc degeneration, lumbosacral region: Secondary | ICD-10-CM | POA: Diagnosis not present

## 2017-05-17 DIAGNOSIS — M9903 Segmental and somatic dysfunction of lumbar region: Secondary | ICD-10-CM | POA: Diagnosis not present

## 2017-05-22 DIAGNOSIS — M5137 Other intervertebral disc degeneration, lumbosacral region: Secondary | ICD-10-CM | POA: Diagnosis not present

## 2017-05-22 DIAGNOSIS — M47817 Spondylosis without myelopathy or radiculopathy, lumbosacral region: Secondary | ICD-10-CM | POA: Diagnosis not present

## 2017-05-22 DIAGNOSIS — M4607 Spinal enthesopathy, lumbosacral region: Secondary | ICD-10-CM | POA: Diagnosis not present

## 2017-05-22 DIAGNOSIS — M5387 Other specified dorsopathies, lumbosacral region: Secondary | ICD-10-CM | POA: Diagnosis not present

## 2017-05-22 DIAGNOSIS — M9903 Segmental and somatic dysfunction of lumbar region: Secondary | ICD-10-CM | POA: Diagnosis not present

## 2017-05-22 DIAGNOSIS — M5417 Radiculopathy, lumbosacral region: Secondary | ICD-10-CM | POA: Diagnosis not present

## 2017-05-23 DIAGNOSIS — M5137 Other intervertebral disc degeneration, lumbosacral region: Secondary | ICD-10-CM | POA: Diagnosis not present

## 2017-05-23 DIAGNOSIS — M7601 Gluteal tendinitis, right hip: Secondary | ICD-10-CM | POA: Diagnosis not present

## 2017-05-23 DIAGNOSIS — M47817 Spondylosis without myelopathy or radiculopathy, lumbosacral region: Secondary | ICD-10-CM | POA: Diagnosis not present

## 2017-05-23 DIAGNOSIS — M9903 Segmental and somatic dysfunction of lumbar region: Secondary | ICD-10-CM | POA: Diagnosis not present

## 2017-05-23 DIAGNOSIS — M5417 Radiculopathy, lumbosacral region: Secondary | ICD-10-CM | POA: Diagnosis not present

## 2017-05-24 DIAGNOSIS — M62830 Muscle spasm of back: Secondary | ICD-10-CM | POA: Diagnosis not present

## 2017-05-24 DIAGNOSIS — M25552 Pain in left hip: Secondary | ICD-10-CM | POA: Diagnosis not present

## 2017-05-24 DIAGNOSIS — M545 Low back pain: Secondary | ICD-10-CM | POA: Diagnosis not present

## 2017-05-24 DIAGNOSIS — M25562 Pain in left knee: Secondary | ICD-10-CM | POA: Diagnosis not present

## 2017-05-28 DIAGNOSIS — M7601 Gluteal tendinitis, right hip: Secondary | ICD-10-CM | POA: Diagnosis not present

## 2017-05-28 DIAGNOSIS — M5417 Radiculopathy, lumbosacral region: Secondary | ICD-10-CM | POA: Diagnosis not present

## 2017-05-28 DIAGNOSIS — M5137 Other intervertebral disc degeneration, lumbosacral region: Secondary | ICD-10-CM | POA: Diagnosis not present

## 2017-05-28 DIAGNOSIS — M9903 Segmental and somatic dysfunction of lumbar region: Secondary | ICD-10-CM | POA: Diagnosis not present

## 2017-05-28 DIAGNOSIS — M47817 Spondylosis without myelopathy or radiculopathy, lumbosacral region: Secondary | ICD-10-CM | POA: Diagnosis not present

## 2017-05-30 DIAGNOSIS — M47817 Spondylosis without myelopathy or radiculopathy, lumbosacral region: Secondary | ICD-10-CM | POA: Diagnosis not present

## 2017-05-30 DIAGNOSIS — M9903 Segmental and somatic dysfunction of lumbar region: Secondary | ICD-10-CM | POA: Diagnosis not present

## 2017-05-30 DIAGNOSIS — M5137 Other intervertebral disc degeneration, lumbosacral region: Secondary | ICD-10-CM | POA: Diagnosis not present

## 2017-05-30 DIAGNOSIS — M5417 Radiculopathy, lumbosacral region: Secondary | ICD-10-CM | POA: Diagnosis not present

## 2017-05-31 DIAGNOSIS — E119 Type 2 diabetes mellitus without complications: Secondary | ICD-10-CM | POA: Diagnosis not present

## 2017-05-31 DIAGNOSIS — M9903 Segmental and somatic dysfunction of lumbar region: Secondary | ICD-10-CM | POA: Diagnosis not present

## 2017-05-31 DIAGNOSIS — M5137 Other intervertebral disc degeneration, lumbosacral region: Secondary | ICD-10-CM | POA: Diagnosis not present

## 2017-05-31 DIAGNOSIS — M5417 Radiculopathy, lumbosacral region: Secondary | ICD-10-CM | POA: Diagnosis not present

## 2017-05-31 DIAGNOSIS — H52223 Regular astigmatism, bilateral: Secondary | ICD-10-CM | POA: Diagnosis not present

## 2017-05-31 DIAGNOSIS — H5203 Hypermetropia, bilateral: Secondary | ICD-10-CM | POA: Diagnosis not present

## 2017-05-31 DIAGNOSIS — H2513 Age-related nuclear cataract, bilateral: Secondary | ICD-10-CM | POA: Diagnosis not present

## 2017-05-31 DIAGNOSIS — M47817 Spondylosis without myelopathy or radiculopathy, lumbosacral region: Secondary | ICD-10-CM | POA: Diagnosis not present

## 2017-06-04 DIAGNOSIS — M5417 Radiculopathy, lumbosacral region: Secondary | ICD-10-CM | POA: Diagnosis not present

## 2017-06-04 DIAGNOSIS — M9903 Segmental and somatic dysfunction of lumbar region: Secondary | ICD-10-CM | POA: Diagnosis not present

## 2017-06-04 DIAGNOSIS — M5137 Other intervertebral disc degeneration, lumbosacral region: Secondary | ICD-10-CM | POA: Diagnosis not present

## 2017-06-04 DIAGNOSIS — M47817 Spondylosis without myelopathy or radiculopathy, lumbosacral region: Secondary | ICD-10-CM | POA: Diagnosis not present

## 2017-06-05 DIAGNOSIS — M5417 Radiculopathy, lumbosacral region: Secondary | ICD-10-CM | POA: Diagnosis not present

## 2017-06-05 DIAGNOSIS — M9903 Segmental and somatic dysfunction of lumbar region: Secondary | ICD-10-CM | POA: Diagnosis not present

## 2017-06-05 DIAGNOSIS — M47817 Spondylosis without myelopathy or radiculopathy, lumbosacral region: Secondary | ICD-10-CM | POA: Diagnosis not present

## 2017-06-05 DIAGNOSIS — M5137 Other intervertebral disc degeneration, lumbosacral region: Secondary | ICD-10-CM | POA: Diagnosis not present

## 2017-06-07 DIAGNOSIS — M47817 Spondylosis without myelopathy or radiculopathy, lumbosacral region: Secondary | ICD-10-CM | POA: Diagnosis not present

## 2017-06-07 DIAGNOSIS — M5417 Radiculopathy, lumbosacral region: Secondary | ICD-10-CM | POA: Diagnosis not present

## 2017-06-07 DIAGNOSIS — M9903 Segmental and somatic dysfunction of lumbar region: Secondary | ICD-10-CM | POA: Diagnosis not present

## 2017-06-07 DIAGNOSIS — M5137 Other intervertebral disc degeneration, lumbosacral region: Secondary | ICD-10-CM | POA: Diagnosis not present

## 2017-06-09 DIAGNOSIS — Z0100 Encounter for examination of eyes and vision without abnormal findings: Secondary | ICD-10-CM | POA: Diagnosis not present

## 2017-06-11 DIAGNOSIS — M47817 Spondylosis without myelopathy or radiculopathy, lumbosacral region: Secondary | ICD-10-CM | POA: Diagnosis not present

## 2017-06-11 DIAGNOSIS — M9903 Segmental and somatic dysfunction of lumbar region: Secondary | ICD-10-CM | POA: Diagnosis not present

## 2017-06-11 DIAGNOSIS — M5417 Radiculopathy, lumbosacral region: Secondary | ICD-10-CM | POA: Diagnosis not present

## 2017-06-11 DIAGNOSIS — M5137 Other intervertebral disc degeneration, lumbosacral region: Secondary | ICD-10-CM | POA: Diagnosis not present

## 2017-06-13 DIAGNOSIS — M5137 Other intervertebral disc degeneration, lumbosacral region: Secondary | ICD-10-CM | POA: Diagnosis not present

## 2017-06-13 DIAGNOSIS — M5417 Radiculopathy, lumbosacral region: Secondary | ICD-10-CM | POA: Diagnosis not present

## 2017-06-13 DIAGNOSIS — M9903 Segmental and somatic dysfunction of lumbar region: Secondary | ICD-10-CM | POA: Diagnosis not present

## 2017-06-13 DIAGNOSIS — M47817 Spondylosis without myelopathy or radiculopathy, lumbosacral region: Secondary | ICD-10-CM | POA: Diagnosis not present

## 2017-06-14 DIAGNOSIS — M47817 Spondylosis without myelopathy or radiculopathy, lumbosacral region: Secondary | ICD-10-CM | POA: Diagnosis not present

## 2017-06-14 DIAGNOSIS — M5417 Radiculopathy, lumbosacral region: Secondary | ICD-10-CM | POA: Diagnosis not present

## 2017-06-14 DIAGNOSIS — M9903 Segmental and somatic dysfunction of lumbar region: Secondary | ICD-10-CM | POA: Diagnosis not present

## 2017-06-14 DIAGNOSIS — M5137 Other intervertebral disc degeneration, lumbosacral region: Secondary | ICD-10-CM | POA: Diagnosis not present

## 2017-06-18 DIAGNOSIS — M47817 Spondylosis without myelopathy or radiculopathy, lumbosacral region: Secondary | ICD-10-CM | POA: Diagnosis not present

## 2017-06-18 DIAGNOSIS — M9903 Segmental and somatic dysfunction of lumbar region: Secondary | ICD-10-CM | POA: Diagnosis not present

## 2017-06-18 DIAGNOSIS — M5137 Other intervertebral disc degeneration, lumbosacral region: Secondary | ICD-10-CM | POA: Diagnosis not present

## 2017-06-18 DIAGNOSIS — M5417 Radiculopathy, lumbosacral region: Secondary | ICD-10-CM | POA: Diagnosis not present

## 2017-06-26 DIAGNOSIS — M9903 Segmental and somatic dysfunction of lumbar region: Secondary | ICD-10-CM | POA: Diagnosis not present

## 2017-06-26 DIAGNOSIS — M5137 Other intervertebral disc degeneration, lumbosacral region: Secondary | ICD-10-CM | POA: Diagnosis not present

## 2017-06-26 DIAGNOSIS — M47817 Spondylosis without myelopathy or radiculopathy, lumbosacral region: Secondary | ICD-10-CM | POA: Diagnosis not present

## 2017-06-26 DIAGNOSIS — M5417 Radiculopathy, lumbosacral region: Secondary | ICD-10-CM | POA: Diagnosis not present

## 2017-06-27 DIAGNOSIS — M62830 Muscle spasm of back: Secondary | ICD-10-CM | POA: Diagnosis not present

## 2017-06-27 DIAGNOSIS — M545 Low back pain: Secondary | ICD-10-CM | POA: Diagnosis not present

## 2017-06-27 DIAGNOSIS — M47817 Spondylosis without myelopathy or radiculopathy, lumbosacral region: Secondary | ICD-10-CM | POA: Diagnosis not present

## 2017-06-27 DIAGNOSIS — M5137 Other intervertebral disc degeneration, lumbosacral region: Secondary | ICD-10-CM | POA: Diagnosis not present

## 2017-06-27 DIAGNOSIS — M9903 Segmental and somatic dysfunction of lumbar region: Secondary | ICD-10-CM | POA: Diagnosis not present

## 2017-06-27 DIAGNOSIS — M5417 Radiculopathy, lumbosacral region: Secondary | ICD-10-CM | POA: Diagnosis not present

## 2017-07-02 DIAGNOSIS — J019 Acute sinusitis, unspecified: Secondary | ICD-10-CM | POA: Diagnosis not present

## 2017-09-04 DIAGNOSIS — N183 Chronic kidney disease, stage 3 (moderate): Secondary | ICD-10-CM | POA: Diagnosis not present

## 2017-09-04 DIAGNOSIS — E1139 Type 2 diabetes mellitus with other diabetic ophthalmic complication: Secondary | ICD-10-CM | POA: Diagnosis not present

## 2017-09-04 DIAGNOSIS — I10 Essential (primary) hypertension: Secondary | ICD-10-CM | POA: Diagnosis not present

## 2017-09-04 DIAGNOSIS — Z0000 Encounter for general adult medical examination without abnormal findings: Secondary | ICD-10-CM | POA: Diagnosis not present

## 2017-09-04 DIAGNOSIS — E782 Mixed hyperlipidemia: Secondary | ICD-10-CM | POA: Diagnosis not present

## 2017-09-04 DIAGNOSIS — M810 Age-related osteoporosis without current pathological fracture: Secondary | ICD-10-CM | POA: Diagnosis not present

## 2017-09-04 DIAGNOSIS — E1165 Type 2 diabetes mellitus with hyperglycemia: Secondary | ICD-10-CM | POA: Diagnosis not present

## 2017-09-04 DIAGNOSIS — E559 Vitamin D deficiency, unspecified: Secondary | ICD-10-CM | POA: Diagnosis not present

## 2017-09-04 DIAGNOSIS — Z23 Encounter for immunization: Secondary | ICD-10-CM | POA: Diagnosis not present

## 2017-09-04 DIAGNOSIS — I890 Lymphedema, not elsewhere classified: Secondary | ICD-10-CM | POA: Diagnosis not present

## 2017-09-10 DIAGNOSIS — M19071 Primary osteoarthritis, right ankle and foot: Secondary | ICD-10-CM | POA: Diagnosis not present

## 2017-09-10 DIAGNOSIS — M7752 Other enthesopathy of left foot: Secondary | ICD-10-CM | POA: Diagnosis not present

## 2017-09-10 DIAGNOSIS — G5790 Unspecified mononeuropathy of unspecified lower limb: Secondary | ICD-10-CM | POA: Diagnosis not present

## 2017-09-10 DIAGNOSIS — M19072 Primary osteoarthritis, left ankle and foot: Secondary | ICD-10-CM | POA: Diagnosis not present

## 2017-09-10 DIAGNOSIS — M7751 Other enthesopathy of right foot: Secondary | ICD-10-CM | POA: Diagnosis not present

## 2017-09-14 DIAGNOSIS — L308 Other specified dermatitis: Secondary | ICD-10-CM | POA: Diagnosis not present

## 2017-09-14 DIAGNOSIS — L821 Other seborrheic keratosis: Secondary | ICD-10-CM | POA: Diagnosis not present

## 2017-10-03 ENCOUNTER — Emergency Department (HOSPITAL_COMMUNITY): Payer: Medicare HMO

## 2017-10-03 ENCOUNTER — Emergency Department (HOSPITAL_COMMUNITY)
Admission: EM | Admit: 2017-10-03 | Discharge: 2017-10-03 | Disposition: A | Discharge status: Home / Self Care | Payer: Medicare HMO | Attending: Emergency Medicine | Admitting: Emergency Medicine

## 2017-10-03 ENCOUNTER — Other Ambulatory Visit: Payer: Self-pay

## 2017-10-03 ENCOUNTER — Encounter (HOSPITAL_COMMUNITY): Payer: Self-pay | Admitting: Emergency Medicine

## 2017-10-03 DIAGNOSIS — E1122 Type 2 diabetes mellitus with diabetic chronic kidney disease: Secondary | ICD-10-CM | POA: Insufficient documentation

## 2017-10-03 DIAGNOSIS — Z7982 Long term (current) use of aspirin: Secondary | ICD-10-CM | POA: Diagnosis not present

## 2017-10-03 DIAGNOSIS — Z7984 Long term (current) use of oral hypoglycemic drugs: Secondary | ICD-10-CM | POA: Insufficient documentation

## 2017-10-03 DIAGNOSIS — R000 Tachycardia, unspecified: Secondary | ICD-10-CM | POA: Diagnosis not present

## 2017-10-03 DIAGNOSIS — Z87891 Personal history of nicotine dependence: Secondary | ICD-10-CM | POA: Diagnosis not present

## 2017-10-03 DIAGNOSIS — I471 Supraventricular tachycardia: Secondary | ICD-10-CM | POA: Diagnosis not present

## 2017-10-03 DIAGNOSIS — N189 Chronic kidney disease, unspecified: Secondary | ICD-10-CM | POA: Insufficient documentation

## 2017-10-03 DIAGNOSIS — Z79899 Other long term (current) drug therapy: Secondary | ICD-10-CM | POA: Diagnosis not present

## 2017-10-03 DIAGNOSIS — N3000 Acute cystitis without hematuria: Secondary | ICD-10-CM | POA: Diagnosis not present

## 2017-10-03 DIAGNOSIS — R0602 Shortness of breath: Secondary | ICD-10-CM | POA: Diagnosis not present

## 2017-10-03 DIAGNOSIS — I129 Hypertensive chronic kidney disease with stage 1 through stage 4 chronic kidney disease, or unspecified chronic kidney disease: Secondary | ICD-10-CM | POA: Insufficient documentation

## 2017-10-03 DIAGNOSIS — Z96652 Presence of left artificial knee joint: Secondary | ICD-10-CM | POA: Diagnosis not present

## 2017-10-03 DIAGNOSIS — R42 Dizziness and giddiness: Secondary | ICD-10-CM | POA: Diagnosis present

## 2017-10-03 LAB — CBC
HCT: 40.3 % (ref 36.0–46.0)
HEMOGLOBIN: 13.4 g/dL (ref 12.0–15.0)
MCH: 27.3 pg (ref 26.0–34.0)
MCHC: 33.3 g/dL (ref 30.0–36.0)
MCV: 82.2 fL (ref 78.0–100.0)
Platelets: 272 10*3/uL (ref 150–400)
RBC: 4.9 MIL/uL (ref 3.87–5.11)
RDW: 13.4 % (ref 11.5–15.5)
WBC: 15.8 10*3/uL — ABNORMAL HIGH (ref 4.0–10.5)

## 2017-10-03 LAB — URINALYSIS, ROUTINE W REFLEX MICROSCOPIC
Bilirubin Urine: NEGATIVE
Glucose, UA: NEGATIVE mg/dL
Hgb urine dipstick: NEGATIVE
Ketones, ur: 5 mg/dL — AB
NITRITE: NEGATIVE
PH: 5 (ref 5.0–8.0)
PROTEIN: NEGATIVE mg/dL
RBC / HPF: NONE SEEN RBC/hpf (ref 0–5)
Specific Gravity, Urine: 1.01 (ref 1.005–1.030)

## 2017-10-03 LAB — BASIC METABOLIC PANEL
ANION GAP: 13 (ref 5–15)
BUN: 25 mg/dL — ABNORMAL HIGH (ref 6–20)
CO2: 23 mmol/L (ref 22–32)
Calcium: 9.6 mg/dL (ref 8.9–10.3)
Chloride: 104 mmol/L (ref 101–111)
Creatinine, Ser: 1.98 mg/dL — ABNORMAL HIGH (ref 0.44–1.00)
GFR calc Af Amer: 28 mL/min — ABNORMAL LOW (ref >60)
GFR, EST NON AFRICAN AMERICAN: 24 mL/min — AB (ref >60)
Glucose, Bld: 155 mg/dL — ABNORMAL HIGH (ref 65–99)
POTASSIUM: 3.7 mmol/L (ref 3.5–5.1)
Sodium: 140 mmol/L (ref 135–145)

## 2017-10-03 LAB — POCT I-STAT, CHEM 8
BUN: 26 mg/dL — AB (ref 6–20)
CALCIUM ION: 1.09 mmol/L — AB (ref 1.15–1.40)
CHLORIDE: 104 mmol/L (ref 101–111)
CREATININE: 2 mg/dL — AB (ref 0.44–1.00)
GLUCOSE: 151 mg/dL — AB (ref 65–99)
HCT: 43 % (ref 36.0–46.0)
Hemoglobin: 14.6 g/dL (ref 12.0–15.0)
Potassium: 3.6 mmol/L (ref 3.5–5.1)
Sodium: 138 mmol/L (ref 135–145)
TCO2: 22 mmol/L (ref 22–32)

## 2017-10-03 LAB — TSH: TSH: 1.567 u[IU]/mL (ref 0.350–4.500)

## 2017-10-03 LAB — GLUCOSE, CAPILLARY: Glucose-Capillary: 122 mg/dL — ABNORMAL HIGH (ref 65–99)

## 2017-10-03 LAB — D-DIMER, QUANTITATIVE (NOT AT ARMC): D DIMER QUANT: 0.59 ug{FEU}/mL — AB (ref 0.00–0.50)

## 2017-10-03 MED ORDER — TECHNETIUM TO 99M ALBUMIN AGGREGATED
4.1200 | Freq: Once | INTRAVENOUS | Status: AC | PRN
  Administered 2017-10-03: 4.12 via INTRAVENOUS

## 2017-10-03 MED ORDER — PHENYLEPHRINE 200 MCG/ML FOR PRIAPISM / HYPOTENSION
INTRAMUSCULAR | Status: AC
  Filled 2017-10-03: qty 50

## 2017-10-03 MED ORDER — KETAMINE HCL-SODIUM CHLORIDE 100-0.9 MG/10ML-% IV SOSY
2.0000 mg/kg | PREFILLED_SYRINGE | Freq: Once | INTRAVENOUS | Status: DC
  Filled 2017-10-03: qty 20

## 2017-10-03 MED ORDER — METOPROLOL SUCCINATE ER 25 MG PO TB24
25.0000 mg | ORAL_TABLET | Freq: Every day | ORAL | 0 refills | Status: DC
Start: 2017-10-03 — End: 2017-10-23

## 2017-10-03 MED ORDER — CIPROFLOXACIN HCL 500 MG PO TABS
500.0000 mg | ORAL_TABLET | Freq: Once | ORAL | Status: AC
  Administered 2017-10-03: 500 mg via ORAL
  Filled 2017-10-03: qty 1

## 2017-10-03 MED ORDER — ADENOSINE 6 MG/2ML IV SOLN
INTRAVENOUS | Status: AC
  Administered 2017-10-03: 6 mg
  Filled 2017-10-03: qty 2

## 2017-10-03 MED ORDER — SODIUM CHLORIDE 0.9 % IV SOLN
INTRAVENOUS | Status: AC | PRN
  Administered 2017-10-03: 1000 mL via INTRAVENOUS

## 2017-10-03 MED ORDER — CIPROFLOXACIN HCL 500 MG PO TABS: 500.0000 mg | ORAL_TABLET | Freq: Two times a day (BID) | ORAL | 0 refills | Status: DC

## 2017-10-03 MED ORDER — TECHNETIUM TC 99M DIETHYLENETRIAME-PENTAACETIC ACID
32.3000 | Freq: Once | INTRAVENOUS | Status: AC | PRN
  Administered 2017-10-03: 32.3 via RESPIRATORY_TRACT

## 2017-10-03 MED ORDER — CIPROFLOXACIN IN D5W 400 MG/200ML IV SOLN: 400.0000 mg | Freq: Once | INTRAVENOUS | Status: DC

## 2017-10-03 MED ORDER — METOPROLOL TARTRATE 5 MG/5ML IV SOLN
5.0000 mg | Freq: Once | INTRAVENOUS | Status: AC
  Administered 2017-10-03: 5 mg via INTRAVENOUS

## 2017-10-03 MED ORDER — METOPROLOL TARTRATE 5 MG/5ML IV SOLN
10.0000 mg | Freq: Once | INTRAVENOUS | Status: DC
  Filled 2017-10-03: qty 10

## 2017-10-03 NOTE — ED Notes (Signed)
Ambulated to bathroom with minimal assist   Tolerated well

## 2017-10-03 NOTE — ED Notes (Signed)
Bed: WA13 Expected date:  Expected time:  Means of arrival:  Comments: Hold for Res B 

## 2017-10-03 NOTE — ED Triage Notes (Signed)
Per pt, states she woke up dizzy this am-went to church around 10 am-still did not feel like herself-states she started having chest pain and "blacking out" around 2 pm-states she still feels dizzy and having some shortness of breath-

## 2017-10-03 NOTE — Discharge Instructions (Signed)
Take metoprolol 25 mg daily.   Take cipro twice daily for 5 days.   See your doctor. You need to see cardiology for follow up   Return to ER if you have worse palpitations, shortness of breath, fever, vomiting.

## 2017-10-03 NOTE — ED Provider Notes (Signed)
[facility]-EMERGENCY DEPT Provider Note   CSN: [id number] Arrival date & time: 10/03/17  1442     History   Chief Complaint Chief Complaint  Patient presents with  . Dizziness    HPI Holly Fields is a 76 y.o. female.  HPI   76 year old female with history of diabetes, hypertension, CKD presents with concern for sudden onset palpitations, shortness of breath and chest pain.  Patient reports she was in her normal state of health, celebrating her mother's 100/day, cutting cake, when she suddenly developed shortness of breath, palpitations, lightheadedness.  Believes she had a brief syncopal episode.  Denies any preceding chest pain or shortness of breath.  Denies cough, fevers, urinary symptoms, abdominal pain, vomiting, diarrhea.  Denies any history of prior cardiac arrhythmia.  Past Medical History:  Diagnosis Date  . Arthritis   . CKD (chronic kidney disease)     saw dr Lowell Guitar 2 years ago, now released from nephrology  . Complication of anesthesia 2010   "sluggish after anesthesia and had vertigo", slow to awaken after knee artroscopy 2012  . Diabetes (HCC)   . Eczema    at times  . Edema   . GERD (gastroesophageal reflux disease)   . HTN (hypertension)   . Hypercholesteremia   . Lymphedema of leg    right more than RIGHT  . Narrowing of airway 2010  . Numbness and tingling of right leg   . Osteopenia   . PONV (postoperative nausea and vomiting)    pt has n/v and vertigo after anesthesia  . Vertigo    at times, cannot turn on left side quickly or sleep on left side  . Vitamin D deficiency     Patient Active Problem List   Diagnosis Date Noted  . Osteoporosis without current pathological fracture 04/13/2017  . OA (osteoarthritis) of knee 03/01/2015  . Preoperative cardiovascular examination 01/05/2015  . Spinal stenosis of lumbar region 10/22/2013    Past Surgical History:  Procedure Laterality Date  . arthroscopic knee surgery Left  2012  . CARDIAC CATHETERIZATION  2012  . JOINT REPLACEMENT Right 2010  . LUMBAR LAMINECTOMY/DECOMPRESSION MICRODISCECTOMY N/A 10/22/2013   Procedure: MICROLUMBAR DECOMPRESSION LUMBAR TWO TO THREE, LUMBAR THREE TO FOUR and faramenotomy two,threeand four ,five bilateral;  Surgeon: Javier Docker, MD;  Location: [facility];  Service: Orthopedics;  Laterality: N/A;  . NASAL SINUS SURGERY  yrs ago    x 2  . TOTAL KNEE ARTHROPLASTY Left 03/01/2015   Procedure: LEFT TOTAL KNEE ARTHROPLASTY;  Surgeon: Ollen Gross, MD;  Location: [facility];  Service: Orthopedics;  Laterality: Left;    OB History    No data available       Home Medications    Prior to Admission medications   Medication Sig Start Date End Date Taking? Authorizing Provider  aspirin 81 MG tablet Take 81 mg by mouth daily.   Yes [provider]  atorvastatin (LIPITOR) 10 MG tablet Take 10 mg by mouth every morning.    Yes [provider]  Cholecalciferol (VITAMIN D3) 1000 units CAPS Take by mouth.   Yes [provider]  denosumab (PROLIA) 60 MG/ML SOLN injection Inject 60 mg into the skin every 6 (six) months. Administer in upper arm, thigh, or abdomen   Yes [provider]  hydrochlorothiazide (HYDRODIURIL) 25 MG tablet Take 25 mg by mouth every morning.    Yes [provider]  JANUVIA 50 MG tablet Take 50 mg by mouth every morning.  08/30/13  Yes [provider]  lansoprazole (PREVACID) 15 MG capsule Take 15 mg by mouth daily at 12 noon.   Yes [provider]  lisinopril (PRINIVIL,ZESTRIL) 5 MG tablet Take 5 mg by mouth every morning.    Yes [provider]  triamcinolone cream (KENALOG) 0.1 % APPLY TO AFFECTED AREA UP TO TWICE A DAY AS NEEDED (NOT TO FACE,GROIN,UNDER ARMS) 09/14/17  Yes [provider]  ciprofloxacin (CIPRO) 500 MG tablet Take 1 tablet (500 mg total) by mouth every 12 (twelve) hours. 10/03/17   Charlynne PanderYao, David Hsienta, MD  docusate sodium  (COLACE) 100 MG capsule Take 1 capsule (100 mg total) by mouth 2 (two) times daily. Patient not taking: Reported on 10/03/2017 03/02/15   Dimitri Pedonstable, Amber, PA-C  fexofenadine (ALLEGRA) 180 MG tablet Take 90 mg by mouth as needed for allergies. Takes 1/2 tablet    [provider]  fluticasone (FLONASE) 50 MCG/ACT nasal spray Place 2 sprays into the nose as needed for allergies.     [provider]  HYDROcodone-acetaminophen (NORCO) 7.5-325 MG per tablet Take 1-2 tablets by mouth every 6 (six) hours as needed for moderate pain. Patient not taking: Reported on 04/12/2017 03/02/15   Dimitri Pedonstable, Amber, PA-C  methocarbamol (ROBAXIN) 500 MG tablet Take 1 tablet (500 mg total) by mouth every 6 (six) hours as needed for muscle spasms. Patient not taking: Reported on 04/12/2017 03/02/15   Dimitri Pedonstable, Amber, PA-C  metoprolol succinate (TOPROL-XL) 25 MG 24 hr tablet Take 1 tablet (25 mg total) by mouth daily. 10/03/17   Charlynne PanderYao, David Hsienta, MD  ondansetron (ZOFRAN) 4 MG tablet Take 1 tablet (4 mg total) by mouth every 6 (six) hours as needed for nausea. Patient not taking: Reported on 04/12/2017 03/03/15   Ollen GrossAluisio, Frank, MD  rivaroxaban (XARELTO) 10 MG TABS tablet Take 1 tablet (10 mg total) by mouth daily with breakfast. Patient not taking: Reported on 04/12/2017 03/02/15   Dimitri Pedonstable, Amber, PA-C  tetrahydrozoline 0.05 % ophthalmic solution Place 1 drop into both eyes 2 (two) times daily as needed (Allergies).    [provider]    Family History Family History  Problem Relation Age of Onset  . Arrhythmia Mother   . Hyperlipidemia Mother   . Hypertension Mother   . Arrhythmia Father   . Heart attack Father   . Heart failure Father     Social History Social History  Substance Use Topics  . Smoking status: Former Smoker    Packs/day: 0.25    Years: 15.00    Types: Cigarettes    Quit date: 12/04/1976  . Smokeless tobacco: Never Used  . Alcohol use Yes     Comment: very occasional  wine     Allergies   Adenosine; Augmentin [amoxicillin-pot clavulanate]; Codeine; Doxycycline; Hydromorphone hcl; Metformin and related; Oxycodone; and Tramadol   Review of Systems Review of Systems  Constitutional: Negative for fever.  HENT: Negative for sore throat.   Eyes: Negative for visual disturbance.  Respiratory: Positive for shortness of breath. Negative for cough.   Cardiovascular: Positive for chest pain and palpitations.  Gastrointestinal: Negative for abdominal pain, nausea and vomiting.  Genitourinary: Negative for difficulty urinating.  Musculoskeletal: Negative for back pain and neck pain.  Skin: Negative for rash.  Neurological: Positive for light-headedness. Negative for syncope and headaches.     Physical Exam Updated Vital Signs BP 120/70   Pulse 89   Temp 98.5 F (36.9 C) (Oral)   Resp 16   Ht 5'  2" (1.575 m)   Wt 88 kg (194 lb)   SpO2 100%   BMI 35.48 kg/m   Physical Exam  Constitutional: She is oriented to person, place, and time. She appears well-developed and well-nourished. No distress.  HENT:  Head: Normocephalic and atraumatic.  Eyes: Conjunctivae and EOM are normal.  Neck: Normal range of motion.  Cardiovascular: Regular rhythm, normal heart sounds and intact distal pulses.  Tachycardia present.  Exam reveals no gallop and no friction rub.   No murmur heard. Pulmonary/Chest: Effort normal and breath sounds normal. No respiratory distress. She has no wheezes. She has no rales.  Abdominal: Soft. She exhibits no distension. There is no tenderness. There is no guarding.  Musculoskeletal: She exhibits no edema or tenderness.  Neurological: She is alert and oriented to person, place, and time.  Skin: Skin is warm and dry. No rash noted. She is not diaphoretic. No erythema.  Nursing note and vitals reviewed.    ED Treatments / Results  Labs (all labs ordered are listed, but only abnormal results are displayed) Labs Reviewed  BASIC  METABOLIC PANEL - Abnormal; Notable for the following:       Result Value   Glucose, Bld 155 (*)    BUN 25 (*)    Creatinine, Ser 1.98 (*)    GFR calc non Af Amer 24 (*)    GFR calc Af Amer 28 (*)    All other components within normal limits  CBC - Abnormal; Notable for the following:    WBC 15.8 (*)    All other components within normal limits  URINALYSIS, ROUTINE W REFLEX MICROSCOPIC - Abnormal; Notable for the following:    APPearance HAZY (*)    Ketones, ur 5 (*)    Leukocytes, UA LARGE (*)    Bacteria, UA RARE (*)    Squamous Epithelial / LPF 6-30 (*)    Non Squamous Epithelial 0-5 (*)    All other components within normal limits  GLUCOSE, CAPILLARY - Abnormal; Notable for the following:    Glucose-Capillary 122 (*)    All other components within normal limits  D-DIMER, QUANTITATIVE (NOT AT [facility]) - Abnormal; Notable for the following:    D-Dimer, Quant 0.59 (*)    All other components within normal limits  POCT I-STAT, CHEM 8 - Abnormal; Notable for the following:    BUN 26 (*)    Creatinine, Ser 2.00 (*)    Glucose, Bld 151 (*)    Calcium, Ion 1.09 (*)    All other components within normal limits  URINE CULTURE  TSH  CBG MONITORING, ED  I-STAT TROPONIN, ED  I-STAT CHEM 8, ED    EKG  EKG Interpretation  Date/Time:  Wednesday October 03 2017 16:48:05 EDT Ventricular Rate:  115 PR Interval:    QRS Duration: 90 QT Interval:  335 QTC Calculation: 464 R Axis:   44 Text Interpretation:  Sinus tachycardia Low voltage, precordial leads previous tracing showed SVT Confirmed by Richardean Canal [phone]) on 10/03/2017 5:53:01 PM       Radiology Dg Chest 2 View  Result Date: 10/03/2017 CLINICAL DATA:  Acute onset of dizziness and shortness of breath that began this morning and possible presyncopal episode this afternoon. Current history of hypertension diabetes. EXAM: CHEST  2 VIEW COMPARISON:  10/16/2013, 06/15/2009, 04/14/2009. FINDINGS: AP semi-erect and lateral images  were obtained. Cardiac silhouette normal in size, unchanged. Thoracic aorta mildly atherosclerotic. Hilar and mediastinal contours otherwise unremarkable. Lungs clear. Bronchovascular markings normal.  Pulmonary vascularity normal. No visible pleural effusions. No pneumothorax. Degenerative changes involving the thoracic spine. IMPRESSION: 1.  No acute cardiopulmonary disease. 2.  Aortic Atherosclerosis (ICD10-170.0) Electronically Signed   By: Hulan Saas M.D.   On: 10/03/2017 18:13   Nm Pulmonary Vent And Perf (v/q Scan)  Result Date: 10/03/2017 CLINICAL DATA:  Chest pain. Dizziness. Shortness of breath. High pretest probability of pulmonary embolus. EXAM: NUCLEAR MEDICINE VENTILATION - PERFUSION LUNG SCAN TECHNIQUE: Ventilation images were obtained in multiple projections using inhaled aerosol Tc-61m DTPA. Perfusion images were obtained in multiple projections after intravenous injection of Tc-38m MAA. RADIOPHARMACEUTICALS:  32.3 mCi Technetium-41m DTPA aerosol inhalation and 4.12 mCi Technetium-83m MAA IV COMPARISON:  Radiograph earlier this day. FINDINGS: Ventilation: Mild decreased ventilation to the superior segment of the left lower lobe. Otherwise homogeneous ventilation. Perfusion: Matched mild decreased profusion to the superior segment of the left lower lobe. No additional wedge shaped peripheral perfusion defects to suggest acute pulmonary embolism. IMPRESSION: Low probability for pulmonary embolus with single small matched ventilation perfusion defect in the superior segment of the left lower lobe. No ventilation perfusion mismatches. Electronically Signed   By: Rubye Oaks M.D.   On: 10/03/2017 21:13    Procedures Procedures (including critical care time)  Medications Ordered in ED Medications  adenosine (ADENOCARD) 6 MG/2ML injection (6 mg  Given 10/03/17 1604)  0.9 %  sodium chloride infusion ( Intravenous Stopped 10/03/17 1939)  metoprolol tartrate (LOPRESSOR) injection 5 mg  (5 mg Intravenous Given 10/03/17 1816)  technetium TC 32M diethylenetriame-pentaacetic acid (DTPA) injection 32.3 millicurie (32.3 millicuries Inhalation Given 10/03/17 2001)  technetium albumin aggregated (MAA) injection solution 4.12 millicurie (4.12 millicuries Intravenous Contrast Given 10/03/17 2030)  ciprofloxacin (CIPRO) tablet 500 mg (500 mg Oral Given 10/03/17 2125)   CRITICAL CARE Performed by: Lynnea Ferrier   Total critical care time: 30 minutes  Critical care time was exclusive of separately billable procedures and treating other patients.  Critical care was necessary to treat or prevent imminent or life-threatening deterioration.  Critical care was time spent personally by me on the following activities: development of treatment plan with patient and/or surrogate as well as nursing, discussions with consultants, evaluation of patient's response to treatment, examination of patient, obtaining history from patient or surrogate, ordering and performing treatments and interventions, ordering and review of laboratory studies, ordering and review of radiographic studies, pulse oximetry and re-evaluation of patient's condition.    Angiocath insertion Performed by: Lynnea Ferrier  Consent: Verbal consent obtained. Risks and benefits: risks, benefits and alternatives were discussed Time out: Immediately prior to procedure a "time out" was called to verify the correct patient, procedure, equipment, support staff and site/side marked as required.  Preparation: Patient was prepped and draped in the usual sterile fashion.  Vein Location: left AC  Ultrasound Guided  Gauge: 20g  Normal blood return and flush without difficulty Patient tolerance: Patient tolerated the procedure well with no immediate complications.    Initial Impression / Assessment and Plan / ED Course  I have reviewed the triage vital signs and the nursing notes.  Pertinent labs & imaging results that  were available during my care of the patient were reviewed by me and considered in my medical decision making (see chart for details).     76 year old female with history of diabetes, hypertension, CKD presents with concern for sudden onset palpitations, shortness of breath and chest pain.  Patient in SVT with rate up to 200 and blood pressures 80s and  90s systolic on arrival.  Difficult IV stick, placed US guided IV.  Prepared for possible cardioversion/sedation.  Discussed with family, reviewed allergies with patient and husband at bedside (did not include adenosine) and gave 6mg  of adenosine with cardioversion to sinus rhythm.  Patient denies continuing symptoms after cardioversion, including no chest pain, no dyspnea, no lightheadedness, and reports she feels back to baseline. Discussed with Dr. Mayford Knife, plan to start metoprolol and have pt follow up as outpt.  UA, CXR pending, signed out to Dr. Silverio Lay. Sinus tachycardia continuing with a rate around 110, noted to be 101 at preop visit in May. Dr. Silverio Lay assuming care, giving metoprolol and ordering VQ scan.    Final Clinical Impressions(s) / ED Diagnoses   Final diagnoses:  SVT (supraventricular tachycardia) (HCC)  Acute cystitis without hematuria    New Prescriptions Discharge Medication List as of 10/03/2017  9:47 PM    START taking these medications   Details  ciprofloxacin (CIPRO) 500 MG tablet Take 1 tablet (500 mg total) by mouth every 12 (twelve) hours., Starting Wed 10/03/2017, Print    metoprolol succinate (TOPROL-XL) 25 MG 24 hr tablet Take 1 tablet (25 mg total) by mouth daily., Starting Wed 10/03/2017, Print         Alvira Monday, MD 10/04/17 1058

## 2017-10-03 NOTE — ED Notes (Signed)
Attempted vagal maneuver. HR stayed same.

## 2017-10-03 NOTE — ED Notes (Signed)
Lab called and reports running D-dimer and TSH.

## 2017-10-03 NOTE — ED Provider Notes (Addendum)
  Physical Exam  BP 120/74 (BP Location: Right Arm)   Pulse (!) 106   Temp 98.5 F (36.9 C) (Oral)   Resp 15   Ht 5\' 2"  (1.575 m)   Wt 88 kg (194 lb)   SpO2 100%   BMI 35.48 kg/m   Physical Exam  ED Course  Procedures  MDM Care assumed at 5 pm. Patient had SVT that improved with adenosine. However, she is still tachy around 110-120, sinus tachycardia. Had some SOB earlier and recently traveled to [place]. D-dimer mildly elevated. Baseline Cr 1.9 so can't get CTA. Sign out pending reassessment, VQ scan.   9:44 PM VQ scan showed low probability. UA + UTI, given cipro (has PCN allergy). HR down to 90s-100 now. Will start on toprol XL 25 mg daily, cipro BID x 5 days.      Charlynne PanderYao, David Hsienta, MD 10/03/17 2144    Charlynne PanderYao, David Hsienta, MD 10/03/17 [phone]

## 2017-10-03 NOTE — ED Notes (Addendum)
ED Provider at bedside. 

## 2017-10-05 LAB — URINE CULTURE

## 2017-10-16 DIAGNOSIS — R7989 Other specified abnormal findings of blood chemistry: Secondary | ICD-10-CM | POA: Diagnosis not present

## 2017-10-16 DIAGNOSIS — N183 Chronic kidney disease, stage 3 (moderate): Secondary | ICD-10-CM | POA: Diagnosis not present

## 2017-10-16 DIAGNOSIS — I471 Supraventricular tachycardia: Secondary | ICD-10-CM | POA: Diagnosis not present

## 2017-10-16 DIAGNOSIS — I10 Essential (primary) hypertension: Secondary | ICD-10-CM | POA: Diagnosis not present

## 2017-10-22 ENCOUNTER — Encounter: Payer: Self-pay | Admitting: Physician Assistant

## 2017-10-22 DIAGNOSIS — I10 Essential (primary) hypertension: Secondary | ICD-10-CM | POA: Insufficient documentation

## 2017-10-22 DIAGNOSIS — I471 Supraventricular tachycardia: Secondary | ICD-10-CM | POA: Insufficient documentation

## 2017-10-22 DIAGNOSIS — R000 Tachycardia, unspecified: Secondary | ICD-10-CM | POA: Insufficient documentation

## 2017-10-22 NOTE — Progress Notes (Signed)
Cardiology Office Note    Date:  10/23/2017  ID:  Holly Fields, DOB 03/10/1941, MRN [id number] PCP:  Juluis RainierBarnes, Elizabeth, MD  Cardiologist:  Dr. Katrinka BlazingSmith   Chief Complaint: f/u ER visit for SVT  History of Present Illness:  Holly Fields is a 76 y.o. female with history of essential HTN, lymphedema, DM, CKD stage III, sinus tachycardia who presents for post-ER visit for SVT. She has history of remote cath in 2010 done for abnormal nuc showing normal coronary arteries, LVEF and LVEDP. She last saw Dr. Katrinka BlazingSmith in 2016 for pre-op eval and was cleared to proceed with knee surgery, but found to have HR of 108 at that time. TSH was normal. More recently she had abrupt onset of pounding in her head, unusual chest discomfort and dyspnea. She felt like she was "fading." She did not completely pass out but felt near-syncopal. She presented to ED 10/03/17 where she was found to have SVT (narrow complex) with HR around 200 and hypotension. She was cardioverted using 6mg  of adenosine. Labs showed worsening renal function with Cr 1.98 (previous 1.68 this year), leukocytosis with WBC 15.8 (prev elevated intermittently as well), normal TSH, K 3.6-3.7, abnormal d-dimer and negative VQ. Case was discussed with Dr. Mayford Knifeurner who recommended initiation of metoprolol. She was also given rx for Cipro for possible UTI.  She presents back for follow-up with her husband. She has not had any recurrent chest discomfort, dyspnea or head pounding similar to recent event. She does admit to continued occasional lightheadedness. Her major complaint is continued swelling in her legs. She reports for the past 6 years she's carried the diagnosis of lymphedema but she occasionally doubts this diagnosis. It began after her knee surgery in 2010 but has generally gotten worse these last 2 years. Dr. Michaelle CopasSmith's note in 2016 documents no edema, but 2-3+ in 2014. She complains of a generalized heaviness in her legs that is present all the time. The  compression stockings alone contribute to the discomfort. It wakes her from sleep at night. It does get worse with ambulation as well. She is followed by pain management for lumbar pain as well. She has not had any recent redness. She does not recall ever having a venous duplex. She has [facility] and [facility] for mobilization/lymphedema therapy but this still persists. Her PCP rechecked bloodwork 11/13 and Cr had improved to 1.480. She follows with Dr. Lowell GuitarPowell for nephrology. No recurrent CP. No hx of syncope.  Past Medical History:  Diagnosis Date  . Arthritis   . CKD (chronic kidney disease), stage III [facility](HCC)     saw dr Lowell Guitarpowell 2 years ago, now released from nephrology  . Complication of anesthesia 2010   "sluggish after anesthesia and had vertigo", slow to awaken after knee artroscopy 2012  . Diabetes (HCC)   . Eczema    at times  . GERD (gastroesophageal reflux disease)   . HTN (hypertension)   . Hypercholesteremia   . Lymphedema of leg    right more than RIGHT  . Narrowing of airway 2010  . Numbness and tingling of right leg   . Osteopenia   . PONV (postoperative nausea and vomiting)    pt has n/v and vertigo after anesthesia  . PSVT (paroxysmal supraventricular tachycardia) (HCC)   . Sinus tachycardia   . Vertigo    at times, cannot turn on left side quickly or sleep on left side  . Vitamin D deficiency  Past Surgical History:  Procedure Laterality Date  . arthroscopic knee surgery Left 2012  . CARDIAC CATHETERIZATION  2012  . JOINT REPLACEMENT Right 2010  . LEFT TOTAL KNEE ARTHROPLASTY Left 03/01/2015   Performed by Ollen GrossAluisio, Frank, [facility]  . MICROLUMBAR DECOMPRESSION LUMBAR TWO TO THREE, LUMBAR THREE TO FOUR and faramenotomy two,threeand four ,five bilateral N/A 10/22/2013   Performed by Javier DockerBeane, Jeffrey C, [facility]  . NASAL SINUS SURGERY  yrs ago    x 2    Current Medications: Current Meds  Medication Sig    . aspirin 81 MG tablet Take 81 mg by mouth daily.  [name] Kitchen. atorvastatin (LIPITOR) 10 MG tablet Take 10 mg by mouth every morning.   . Cholecalciferol (VITAMIN D3) 1000 units CAPS Take 1 capsule daily by mouth.   . denosumab (PROLIA) 60 MG/ML SOLN injection Inject 60 mg into the skin every 6 (six) months. Administer in upper arm, thigh, or abdomen  . fexofenadine (ALLEGRA) 180 MG tablet Take 90 mg daily as needed by mouth for allergies. Takes 1/2 tablet   . fluticasone (FLONASE) 50 MCG/ACT nasal spray Place 2 sprays into the nose as needed for allergies.   . hydrochlorothiazide (HYDRODIURIL) 25 MG tablet Take 25 mg by mouth every morning.   [name] Kitchen. JANUVIA 50 MG tablet Take 50 mg by mouth every morning.   . lansoprazole (PREVACID) 15 MG capsule Take 15 mg by mouth daily at 12 noon.  [name] Kitchen. lisinopril (PRINIVIL,ZESTRIL) 5 MG tablet Take 5 mg by mouth every morning.   . metoprolol succinate (TOPROL-XL) 25 MG 24 hr tablet Take 1 tablet (25 mg total) by mouth daily.  [name] Kitchen. tetrahydrozoline 0.05 % ophthalmic solution Place 1 drop into both eyes 2 (two) times daily as needed (Allergies).  . triamcinolone cream (KENALOG) 0.1 % APPLY TO AFFECTED AREA UP TO TWICE A DAY AS NEEDED (NOT TO FACE,GROIN,UNDER ARMS)     Allergies:   Adenosine; Augmentin [amoxicillin-pot clavulanate]; Codeine; Doxycycline; Hydromorphone hcl; Metformin and related; Oxycodone; and Tramadol   Social History   Socioeconomic History  . Marital status: Married    Spouse name: None  . Number of children: None  . Years of education: None  . Highest education level: None  Social Needs  . Financial resource strain: None  . Food insecurity - worry: None  . Food insecurity - inability: None  . Transportation needs - medical: None  . Transportation needs - non-medical: None  Occupational History  . None  Tobacco Use  . Smoking status: Former Smoker    Packs/day: 0.25    Years: 15.00    Pack years: 3.75    Types: Cigarettes    Last attempt to  quit: 12/04/1976    Years since quitting: 40.9  . Smokeless tobacco: Never Used  Substance and Sexual Activity  . Alcohol use: Yes    Comment: very occasional wine  . Drug use: No  . Sexual activity: None  Other Topics Concern  . None  Social History Narrative  . None     Family History:  Family History  Problem Relation Age of Onset  . Arrhythmia Mother   . Hyperlipidemia Mother   . Hypertension Mother   . Arrhythmia Father   . Heart attack Father   . Heart failure Father    ROS:   Please see the history of present illness.  All other systems are reviewed and otherwise negative.    PHYSICAL EXAM:   VS:  BP  106/60   Pulse 62   Ht 5\' 2"  (1.575 m)   Wt 195 lb 12.8 oz (88.8 kg)   BMI 35.81 kg/m   BMI: Body mass index is 35.81 kg/m. GEN: Well nourished, well developed AAF in no acute distress  HEENT: normocephalic, atraumatic Neck: no JVD, carotid bruits, or masses Cardiac: RRR; no murmurs, rubs, or gallops, 2+ soft bilateral LE edema, unable to palpate pedal pulses due to edema Respiratory:  clear to auscultation bilaterally, normal work of breathing GI: soft, nontender, nondistended, + BS MS: no deformity or atrophy  Skin: warm and dry, no rash Neuro:  Alert and Oriented x 3, Strength and sensation are intact, follows commands Psych: euthymic mood, full affect  Wt Readings from Last 3 Encounters:  10/23/17 195 lb 12.8 oz (88.8 kg)  10/03/17 194 lb (88 kg)  04/12/17 194 lb (88 kg)      Studies/Labs Reviewed:   EKG:  EKG was ordered today and personally reviewed by me and demonstrates NSR 62bpm, low voltage QRS, TWI III, otherwise nonacute. No delta wave.  Recent Labs: 10/03/2017: BUN 26; Creatinine, Ser 2.00; Hemoglobin 14.6; Platelets 272; Potassium 3.6; Sodium 138; TSH 1.567   Lipid Panel No results found for: CHOL, TRIG, HDL, CHOLHDL, VLDL, LDLCALC, LDLDIRECT  Additional studies/ records that were reviewed today include: Summarized above   ASSESSMENT  & PLAN:   1. Paroxysmal SVT, also history of sinus tachycardia - HR is well controlled on metoprolol without recurrent events. Would continue for now. She does report some lightheadedness. She clearly needs her diuretic so will stop lisinopril for now and follow. If she has recurrence, would refer to EP for consideration of ablation. 2. Lower extremity edema - this is quite impressive. She states this has historically been referred to as lymphedema. She has never had an echo before or venous duplex. Recent d-dimer was mildly abnormal. While I think DVT is unlikely given the chronicity of this, the fact that her leg pain has worsened and treatment options have been limited by her BP, I think it is prudent to exclude this diagnosis. Will also check BNP along with CMET for albumin. Consideration could be given to changing her HCTZ to Lasix. She will continue to use compression hose. She is also very concerned about her leg pain and read the poster about PAD in the exam room. I cannot palpate pedal pulses due to her edema. I think the study would be technically challenging but she is emotional about ruling out whatever she can due to her chronic pain, so will LE arterial studies. Further recommendations forthcoming depending on above workup. 3. AKI superimposed on chronic kidney disease - repeat Cr by PCP was improved. Follows with Dr. Lowell Guitar. 4. Essential HTN - BP running softer. Stop lisinopril as above. Needs diuretic due to edema and needs BB due to SVT.  Disposition: F/u with me in 2 weeks.  Medication Adjustments/Labs and Tests Ordered: Current medicines are reviewed at length with the patient today.  Concerns regarding medicines are outlined above. Medication changes, Labs and Tests ordered today are summarized above and listed in the Patient Instructions accessible in Encounters.   Signed, Laurann Montana, PA-C  10/23/2017 9:56 AM    [facility] [address] [place], [place],  [place]  [address] Phone: [phone]; Fax: [phone]

## 2017-10-23 ENCOUNTER — Ambulatory Visit (HOSPITAL_COMMUNITY)
Admission: RE | Admit: 2017-10-23 | Discharge: 2017-10-23 | Disposition: A | Discharge status: Home / Self Care | Payer: Medicare HMO | Source: Ambulatory Visit | Attending: Cardiology | Admitting: Cardiology

## 2017-10-23 ENCOUNTER — Encounter: Payer: Self-pay | Admitting: Physician Assistant

## 2017-10-23 ENCOUNTER — Ambulatory Visit (INDEPENDENT_AMBULATORY_CARE_PROVIDER_SITE_OTHER): Payer: Medicare HMO | Admitting: Physician Assistant

## 2017-10-23 VITALS — BP 106/60 | HR 62 | Ht 62.0 in | Wt 195.8 lb

## 2017-10-23 DIAGNOSIS — I10 Essential (primary) hypertension: Secondary | ICD-10-CM

## 2017-10-23 DIAGNOSIS — M79604 Pain in right leg: Secondary | ICD-10-CM

## 2017-10-23 DIAGNOSIS — N189 Chronic kidney disease, unspecified: Secondary | ICD-10-CM

## 2017-10-23 DIAGNOSIS — R600 Localized edema: Secondary | ICD-10-CM

## 2017-10-23 DIAGNOSIS — I471 Supraventricular tachycardia: Secondary | ICD-10-CM

## 2017-10-23 DIAGNOSIS — M79605 Pain in left leg: Secondary | ICD-10-CM | POA: Diagnosis not present

## 2017-10-23 DIAGNOSIS — R000 Tachycardia, unspecified: Secondary | ICD-10-CM

## 2017-10-23 DIAGNOSIS — N179 Acute kidney failure, unspecified: Secondary | ICD-10-CM

## 2017-10-23 LAB — COMPREHENSIVE METABOLIC PANEL
ALBUMIN: 4.1 g/dL (ref 3.5–4.8)
ALK PHOS: 53 IU/L (ref 39–117)
ALT: 14 IU/L (ref 0–32)
AST: 20 IU/L (ref 0–40)
Albumin/Globulin Ratio: 1.5 (ref 1.2–2.2)
BUN/Creatinine Ratio: 14 (ref 12–28)
BUN: 22 mg/dL (ref 8–27)
Bilirubin Total: 0.4 mg/dL (ref 0.0–1.2)
CO2: 24 mmol/L (ref 20–29)
Calcium: 9.8 mg/dL (ref 8.7–10.3)
Chloride: 103 mmol/L (ref 96–106)
Creatinine, Ser: 1.56 mg/dL — ABNORMAL HIGH (ref 0.57–1.00)
GFR calc Af Amer: 37 mL/min/{1.73_m2} — ABNORMAL LOW (ref >59)
GFR, EST NON AFRICAN AMERICAN: 32 mL/min/{1.73_m2} — AB (ref >59)
GLOBULIN, TOTAL: 2.7 g/dL (ref 1.5–4.5)
Glucose: 96 mg/dL (ref 65–99)
Potassium: 4.5 mmol/L (ref 3.5–5.2)
Sodium: 141 mmol/L (ref 134–144)
Total Protein: 6.8 g/dL (ref 6.0–8.5)

## 2017-10-23 LAB — PRO B NATRIURETIC PEPTIDE: NT-Pro BNP: 68 pg/mL (ref 0–301)

## 2017-10-23 MED ORDER — METOPROLOL SUCCINATE ER 25 MG PO TB24: 25.0000 mg | ORAL_TABLET | Freq: Every day | ORAL | 3 refills | Status: DC

## 2017-10-23 NOTE — Patient Instructions (Addendum)
Medication Instructions:  Your physician has recommended you make the following change in your medication:  1.  STOP Lisinopril   Labwork: TODAY:  CMET & PRO BNP  Testing/Procedures:. Your physician has requested that you have a lower or upper extremity venous duplex STAT / TODAY!  This test is an ultrasound of the veins in the legs or arms. It looks at venous blood flow that carries blood from the heart to the legs or arms. Allow one hour for a Lower Venous exam. Allow thirty minutes for an Upper Venous exam. There are no restrictions or special instructions.  Your physician has requested that you have a lower extremity arterial exercise duplex. During this test, exercise and ultrasound are used to evaluate arterial blood flow in the legs. Allow one hour for this exam. There are no restrictions or special instructions.  Your physician has requested that you have an echocardiogram. Echocardiography is a painless test that uses sound waves to create images of your heart. It provides your doctor with information about the size and shape of your heart and how well your heart's chambers and valves are working. This procedure takes approximately one hour. There are no restrictions for this procedure.    Follow-Up: Your physician recommends that you schedule a follow-up appointment in: 2 WEEKS WITH DAYNA DUNN, PA-C   Any Other Special Instructions Will Be Listed Below (If Applicable). Echocardiogram An echocardiogram, or echocardiography, uses sound waves (ultrasound) to produce an image of your heart. The echocardiogram is simple, painless, obtained within a short period of time, and offers valuable information to your health care provider. The images from an echocardiogram can provide information such as:  Evidence of coronary artery disease (CAD).  Heart size.  Heart muscle function.  Heart valve function.  Aneurysm detection.  Evidence of a past heart attack.  Fluid buildup around  the heart.  Heart muscle thickening.  Assess heart valve function.  Tell a health care provider about:  Any allergies you have.  All medicines you are taking, including vitamins, herbs, eye drops, creams, and over-the-counter medicines.  Any problems you or family members have had with anesthetic medicines.  Any blood disorders you have.  Any surgeries you have had.  Any medical conditions you have.  Whether you are pregnant or may be pregnant. What happens before the procedure? No special preparation is needed. Eat and drink normally. What happens during the procedure?  In order to produce an image of your heart, gel will be applied to your chest and a wand-like tool (transducer) will be moved over your chest. The gel will help transmit the sound waves from the transducer. The sound waves will harmlessly bounce off your heart to allow the heart images to be captured in real-time motion. These images will then be recorded.  You may need an IV to receive a medicine that improves the quality of the pictures. What happens after the procedure? You may return to your normal schedule including diet, activities, and medicines, unless your health care provider tells you otherwise. This information is not intended to replace advice given to you by your health care provider. Make sure you discuss any questions you have with your health care provider. Document Released: 11/17/2000 Document Revised: 07/08/2016 Document Reviewed: 07/28/2013 Elsevier Interactive Patient Education  2017 [employer]     If you need a refill on your cardiac medications before your next appointment, please call your pharmacy.

## 2017-10-24 ENCOUNTER — Telehealth: Payer: Self-pay | Admitting: *Deleted

## 2017-10-24 MED ORDER — FUROSEMIDE 20 MG PO TABS: 20.0000 mg | ORAL_TABLET | Freq: Every day | ORAL | 3 refills | Status: DC

## 2017-10-24 NOTE — Telephone Encounter (Signed)
-----   Message from Laurann Montanaayna N Dunn, [place]-C sent at 10/23/2017  4:43 PM EST ----- Please let patient know BMET was stable with stable creatinine. Avoid NSAIDS (ibuprofen, motrin, aleve, naproxen, meloxicam, celebrex). Can try discontinuing HCTZ 12.5mg  daily and starting Lasix 20mg  daily for edema (do not start this on a day when she's already taken the HCTZ, take 24 hours after last dose of HCTZ given h/o softer BP). Would also recommend restricting sodium to 2g per day and fluid to 2L per day. Await echo. Dayna Dunn PA-C

## 2017-11-01 ENCOUNTER — Ambulatory Visit (HOSPITAL_COMMUNITY): Payer: Medicare HMO | Attending: Internal Medicine

## 2017-11-01 ENCOUNTER — Other Ambulatory Visit: Payer: Self-pay

## 2017-11-01 DIAGNOSIS — I471 Supraventricular tachycardia: Secondary | ICD-10-CM | POA: Insufficient documentation

## 2017-11-01 DIAGNOSIS — R600 Localized edema: Secondary | ICD-10-CM | POA: Diagnosis not present

## 2017-11-01 DIAGNOSIS — N189 Chronic kidney disease, unspecified: Secondary | ICD-10-CM

## 2017-11-01 DIAGNOSIS — M79605 Pain in left leg: Secondary | ICD-10-CM | POA: Diagnosis not present

## 2017-11-01 DIAGNOSIS — Z87891 Personal history of nicotine dependence: Secondary | ICD-10-CM | POA: Insufficient documentation

## 2017-11-01 DIAGNOSIS — N179 Acute kidney failure, unspecified: Secondary | ICD-10-CM | POA: Diagnosis not present

## 2017-11-01 DIAGNOSIS — I10 Essential (primary) hypertension: Secondary | ICD-10-CM

## 2017-11-01 DIAGNOSIS — I358 Other nonrheumatic aortic valve disorders: Secondary | ICD-10-CM | POA: Diagnosis not present

## 2017-11-01 DIAGNOSIS — M79604 Pain in right leg: Secondary | ICD-10-CM | POA: Diagnosis not present

## 2017-11-01 DIAGNOSIS — I1310 Hypertensive heart and chronic kidney disease without heart failure, with stage 1 through stage 4 chronic kidney disease, or unspecified chronic kidney disease: Secondary | ICD-10-CM | POA: Insufficient documentation

## 2017-11-01 DIAGNOSIS — R000 Tachycardia, unspecified: Secondary | ICD-10-CM | POA: Diagnosis not present

## 2017-11-06 DIAGNOSIS — I890 Lymphedema, not elsewhere classified: Secondary | ICD-10-CM | POA: Insufficient documentation

## 2017-11-06 NOTE — Progress Notes (Signed)
Cardiology Office Note    Date:  11/08/2017  ID:  Holly Fields, DOB 08/26/1941, MRN [id number] PCP:  Juluis RainierBarnes, Elizabeth, MD  Cardiologist:  Dr. Katrinka BlazingSmith   Chief Complaint: f/u SVT, edema  History of Present Illness:  Holly Fields is a 76 y.o. female with history of essential HTN, lymphedema, DM, CKD stage III, sinus tachycardia who presents for f/u SVT and edema.  She has history of remote cath in 2010 done for abnormal nuc showing normal coronary arteries, LVEF and LVEDP. She saw Dr. Katrinka BlazingSmith in 2016 for pre-op eval and was cleared to proceed with knee surgery, but found to have HR of 108 (sinus tach) at that time. TSH was normal. More recently in 09/2017 she was seen in the ED with abrupt onset of pounding in her head, unusual chest discomfort and dyspnea. She was found to have SVT (narrow complex) with HR around 200 and hypotension. She was cardioverted using 6mg  of adenosine. Labs showed worsening renal function with Cr 1.98 (previous 1.68 this year), leukocytosis with WBC 15.8 (prev elevated intermittently as well), normal TSH, K 3.6-3.7, abnormal d-dimer and negative VQ. Case was discussed with Dr. Mayford Knifeurner who recommended initiation of metoprolol. She was also given rx for Cipro for possible UTI. When I saw her in follow-up 09/22/17 she was feeling much better, aside from chronic edema and subsequent LE pain. LE duplex was negative and BNP/albumin were normal, supporting accuracy of the lymphedema diagnosis. F/u Cr was 1.56 with K 4.5. [facility] and [facility] for [facility]/lymphedema therapy. 2D echo 11/01/17 showed EF 65-70%, grade 1 DD, aortic sclerosis without stenosis, normal CVP. We changed her HCTZ to Lasix and stopped lisinopril due to hypotension, which will likely limit aggressive med titration.  She presents back for follow-up today feeling much better. Ever since stopping the lisinopril, she feels much more alert and robust with  improved appetite. Her edema as improved - it is still present, but much softer. Her leg pain is still present but seems to be improving as well. No CP, palpitations, dyspnea.   Past Medical History:  Diagnosis Date  . Arthritis   . CKD (chronic kidney disease), stage III [facility](HCC)     saw dr Lowell Guitarpowell 2 years ago, now released from nephrology  . Complication of anesthesia 2010   "sluggish after anesthesia and had vertigo", slow to awaken after knee artroscopy 2012  . Diabetes (HCC)   . Eczema    at times  . GERD (gastroesophageal reflux disease)   . HTN (hypertension)   . Hypercholesteremia   . Lymphedema of leg    right more than RIGHT  . Narrowing of airway 2010  . Numbness and tingling of right leg   . Osteopenia   . PONV (postoperative nausea and vomiting)    pt has n/v and vertigo after anesthesia  . PSVT (paroxysmal supraventricular tachycardia) (HCC)   . Sinus tachycardia   . Vertigo    at times, cannot turn on left side quickly or sleep on left side  . Vitamin D deficiency     Past Surgical History:  Procedure Laterality Date  . arthroscopic knee surgery Left 2012  . CARDIAC CATHETERIZATION  2012  . JOINT REPLACEMENT Right 2010  . LUMBAR LAMINECTOMY/DECOMPRESSION MICRODISCECTOMY N/A 10/22/2013   Procedure: MICROLUMBAR DECOMPRESSION LUMBAR TWO TO THREE, LUMBAR THREE TO FOUR and faramenotomy two,threeand four ,five bilateral;  Surgeon: Javier DockerJeffrey C Beane, MD;  Location: [facility];  Service: Orthopedics;  Laterality: N/A;  . NASAL SINUS SURGERY  yrs ago    x 2  . TOTAL KNEE ARTHROPLASTY Left 03/01/2015   Procedure: LEFT TOTAL KNEE ARTHROPLASTY;  Surgeon: Ollen GrossFrank Aluisio, MD;  Location: [facility];  Service: Orthopedics;  Laterality: Left;    Current Medications: Current Meds  Medication Sig  . aspirin 81 MG tablet Take 81 mg by mouth daily.  [name] Kitchen. atorvastatin (LIPITOR) 10 MG tablet Take 10 mg by mouth every morning.   . Cholecalciferol (VITAMIN D3) 1000 units CAPS Take 1 capsule daily  by mouth.   . denosumab (PROLIA) 60 MG/ML SOLN injection Inject 60 mg into the skin every 6 (six) months. Administer in upper arm, thigh, or abdomen  . fexofenadine (ALLEGRA) 180 MG tablet Take 90 mg daily as needed by mouth for allergies. Takes 1/2 tablet   . fluticasone (FLONASE) 50 MCG/ACT nasal spray Place 2 sprays into the nose as needed for allergies.   . furosemide (LASIX) 20 MG tablet Take 1 tablet (20 mg total) by mouth daily.  [name] Kitchen. JANUVIA 50 MG tablet Take 50 mg by mouth every morning.   . lansoprazole (PREVACID) 15 MG capsule Take 15 mg by mouth daily at 12 noon.  . metoprolol succinate (TOPROL-XL) 25 MG 24 hr tablet Take 1 tablet (25 mg total) by mouth daily.  [name] Kitchen. tetrahydrozoline 0.05 % ophthalmic solution Place 1 drop into both eyes 2 (two) times daily as needed (Allergies).  . triamcinolone cream (KENALOG) 0.1 % APPLY TO AFFECTED AREA UP TO TWICE A DAY AS NEEDED (NOT TO FACE,GROIN,UNDER ARMS)     Allergies:   Adenosine; Augmentin [amoxicillin-pot clavulanate]; Codeine; Doxycycline; Hydromorphone hcl; Metformin and related; Oxycodone; and Tramadol   Social History   Socioeconomic History  . Marital status: Married    Spouse name: None  . Number of children: None  . Years of education: None  . Highest education level: None  Social Needs  . Financial resource strain: None  . Food insecurity - worry: None  . Food insecurity - inability: None  . Transportation needs - medical: None  . Transportation needs - non-medical: None  Occupational History  . None  Tobacco Use  . Smoking status: Former Smoker    Packs/day: 0.25    Years: 15.00    Pack years: 3.75    Types: Cigarettes    Last attempt to quit: 12/04/1976    Years since quitting: 40.9  . Smokeless tobacco: Never Used  Substance and Sexual Activity  . Alcohol use: Yes    Comment: very occasional wine  . Drug use: No  . Sexual activity: None  Other Topics Concern  . None  Social History Narrative  . None      Family History:  Family History  Problem Relation Age of Onset  . Arrhythmia Mother   . Hyperlipidemia Mother   . Hypertension Mother   . Arrhythmia Father   . Heart attack Father   . Heart failure Father     ROS:   Please see the history of present illness.  All other systems are reviewed and otherwise negative.    PHYSICAL EXAM:   VS:  BP 130/68   Pulse 76   Resp 16   Ht 5\' 2"  (1.575 m)   Wt 197 lb (89.4 kg)   SpO2 96%   BMI 36.03 kg/m   BMI: Body mass index is 36.03 kg/m. GEN: Well nourished, well developed AAF in no acute distress  HEENT: normocephalic, atraumatic  Neck: no JVD, carotid bruits, or masses Cardiac: RRR; no murmurs, rubs, or gallops, 2+ BLE edema but softer than prior. Respiratory:  clear to auscultation bilaterally, normal work of breathing GI: soft, nontender, nondistended, + BS MS: no deformity or atrophy  Skin: warm and dry, no rash Neuro:  Alert and Oriented x 3, Strength and sensation are intact, follows commands Psych: euthymic mood, full affect  Wt Readings from Last 3 Encounters:  11/08/17 197 lb (89.4 kg)  10/23/17 195 lb 12.8 oz (88.8 kg)  10/03/17 194 lb (88 kg)      Studies/Labs Reviewed:   EKG:  EKG was not ordered today.  Recent Labs: 10/03/2017: Hemoglobin 14.6; Platelets 272; TSH 1.567 10/23/2017: ALT 14; BUN 22; Creatinine, Ser 1.56; NT-Pro BNP 68; Potassium 4.5; Sodium 141   Additional studies/ records that were reviewed today include: Summarized above    ASSESSMENT & PLAN:   1. Paroxysmal SVT, also history of sinus tach - quiescent on metoprolol. Continue. 2. Lymphedema - LE duplex was negative for DVT and BNP was normal, supporting this diagnosis. She feels much better off lisinopril and on Lasix. Will continue current regimen for now. Sodium and fluid restriction reviewed with patient. 3. CKD stage III - recent f/u creatinine was stable. Will plan to obtain BMET when she returns for her arterial study next  week. 4. Leg pain - patient expressed concern for PAD last visit. Pedal pulses unable to be palpated due to significant edema. She has LE arterial studies planned for 1 week. She also follows with pain management. 5. Essential HTN - lisinopril stopped last visit due to tendency for lower BP. This has normalized today. I told her to discuss resumption of lower dose (I.e. 2.5mg ) with her PCP as she would likely benefit from some degree of ACEI given her chronic diabetes and CKD.  Disposition: F/u with Dr. Katrinka Blazing in 6 weeks.  Medication Adjustments/Labs and Tests Ordered: Current medicines are reviewed at length with the patient today.  Concerns regarding medicines are outlined above. Medication changes, Labs and Tests ordered today are summarized above and listed in the Patient Instructions accessible in Encounters.   Signed, Laurann Montana, PA-C  11/08/2017 10:07 AM    [facility] [address] [place], [place], [place]  [address] Phone: [phone]; Fax: [phone]

## 2017-11-08 ENCOUNTER — Ambulatory Visit (INDEPENDENT_AMBULATORY_CARE_PROVIDER_SITE_OTHER): Payer: Medicare HMO | Admitting: Physician Assistant

## 2017-11-08 ENCOUNTER — Encounter: Payer: Self-pay | Admitting: Physician Assistant

## 2017-11-08 VITALS — BP 130/68 | HR 76 | Resp 16 | Ht 62.0 in | Wt 197.0 lb

## 2017-11-08 DIAGNOSIS — I890 Lymphedema, not elsewhere classified: Secondary | ICD-10-CM | POA: Diagnosis not present

## 2017-11-08 DIAGNOSIS — N183 Chronic kidney disease, stage 3 unspecified: Secondary | ICD-10-CM

## 2017-11-08 DIAGNOSIS — I471 Supraventricular tachycardia: Secondary | ICD-10-CM | POA: Diagnosis not present

## 2017-11-08 DIAGNOSIS — I10 Essential (primary) hypertension: Secondary | ICD-10-CM

## 2017-11-08 DIAGNOSIS — M79604 Pain in right leg: Secondary | ICD-10-CM | POA: Diagnosis not present

## 2017-11-08 DIAGNOSIS — M79605 Pain in left leg: Secondary | ICD-10-CM | POA: Diagnosis not present

## 2017-11-08 NOTE — Patient Instructions (Addendum)
Medication Instructions:  Your physician recommends that you continue on your current medications as directed. Please refer to the Current Medication list given to you today.   Labwork: None ordered  Testing/Procedures: None ordered  Follow-Up: Your physician wants you to follow-up in: 6 MONTHS WITH DR. SMITH You will receive a reminder letter in the mail two months in advance. If you don't receive a letter, please call our office to schedule the follow-up appointment.   Any Other Special Instructions Will Be Listed Below (If Applicable).     If you need a refill on your cardiac medications before your next appointment, please call your pharmacy.   

## 2017-11-14 ENCOUNTER — Other Ambulatory Visit: Payer: Self-pay | Admitting: Physician Assistant

## 2017-11-14 DIAGNOSIS — M79604 Pain in right leg: Secondary | ICD-10-CM

## 2017-11-14 DIAGNOSIS — R0989 Other specified symptoms and signs involving the circulatory and respiratory systems: Secondary | ICD-10-CM

## 2017-11-14 DIAGNOSIS — M79605 Pain in left leg: Secondary | ICD-10-CM

## 2017-11-15 ENCOUNTER — Telehealth: Payer: Self-pay | Admitting: *Deleted

## 2017-11-15 ENCOUNTER — Ambulatory Visit (HOSPITAL_COMMUNITY)
Admission: RE | Admit: 2017-11-15 | Discharge: 2017-11-15 | Disposition: A | Discharge status: Home / Self Care | Payer: Medicare HMO | Source: Ambulatory Visit | Attending: Internal Medicine | Admitting: Internal Medicine

## 2017-11-15 DIAGNOSIS — I10 Essential (primary) hypertension: Secondary | ICD-10-CM

## 2017-11-15 DIAGNOSIS — I890 Lymphedema, not elsewhere classified: Secondary | ICD-10-CM

## 2017-11-15 DIAGNOSIS — I471 Supraventricular tachycardia: Secondary | ICD-10-CM | POA: Diagnosis not present

## 2017-11-15 DIAGNOSIS — M79604 Pain in right leg: Secondary | ICD-10-CM

## 2017-11-15 DIAGNOSIS — M79605 Pain in left leg: Secondary | ICD-10-CM

## 2017-11-15 DIAGNOSIS — R0989 Other specified symptoms and signs involving the circulatory and respiratory systems: Secondary | ICD-10-CM | POA: Insufficient documentation

## 2017-11-15 NOTE — Telephone Encounter (Signed)
Labs reordered per the request of the lab technician.

## 2017-11-16 ENCOUNTER — Telehealth: Payer: Self-pay | Admitting: Internal Medicine

## 2017-11-16 LAB — BASIC METABOLIC PANEL
BUN / CREAT RATIO: 18 (ref 12–28)
BUN: 28 mg/dL — AB (ref 8–27)
CO2: 25 mmol/L (ref 20–29)
CREATININE: 1.59 mg/dL — AB (ref 0.57–1.00)
Calcium: 9.9 mg/dL (ref 8.7–10.3)
Chloride: 101 mmol/L (ref 96–106)
GFR, EST AFRICAN AMERICAN: 37 mL/min/{1.73_m2} — AB (ref >59)
GFR, EST NON AFRICAN AMERICAN: 32 mL/min/{1.73_m2} — AB (ref >59)
Glucose: 86 mg/dL (ref 65–99)
Potassium: 4.3 mmol/L (ref 3.5–5.2)
Sodium: 143 mmol/L (ref 134–144)

## 2017-11-16 NOTE — Telephone Encounter (Signed)
She is scheduled to get Prolia 11/21/17 at 10am

## 2017-11-16 NOTE — Telephone Encounter (Signed)
Patient stated she has approval to get prolia injection, I want you to make sure you have what you ned before she is scheduled please advise  [phone] Cell#

## 2017-11-21 ENCOUNTER — Ambulatory Visit (INDEPENDENT_AMBULATORY_CARE_PROVIDER_SITE_OTHER): Payer: Medicare HMO

## 2017-11-21 DIAGNOSIS — M810 Age-related osteoporosis without current pathological fracture: Secondary | ICD-10-CM | POA: Diagnosis not present

## 2017-11-21 MED ORDER — DENOSUMAB 60 MG/ML ~~LOC~~ SOLN
60.0000 mg | Freq: Once | SUBCUTANEOUS | Status: AC
  Administered 2017-11-21: 60 mg via SUBCUTANEOUS

## 2018-01-22 DIAGNOSIS — R141 Gas pain: Secondary | ICD-10-CM | POA: Diagnosis not present

## 2018-01-22 DIAGNOSIS — R198 Other specified symptoms and signs involving the digestive system and abdomen: Secondary | ICD-10-CM | POA: Diagnosis not present

## 2018-01-28 ENCOUNTER — Telehealth: Payer: Self-pay | Admitting: Physician Assistant

## 2018-01-28 NOTE — Telephone Encounter (Signed)
New Message   .Patient is requesting a call back in reference to some issues that she is having. She indicates that she is not having any SOB but sometime she feels like she has to take a deep breath. She is not gasping for hair but its just a hard breath. Please call to discuss.    Pt c/o medication issue:  1. Name of Medication: furosemide (LASIX) 20 MG   2. How are you currently taking this medication (dosage and times per day)? 20mg  once a day   3. Are you having a reaction (difficulty breathing--STAT)? no  4. What is your medication issue? Patient states that the lasix is causing her to go to the bathroom to frequently. Frequent enough as to where she is not able to leave the house much. She wants to know is there something else she can take.

## 2018-01-28 NOTE — Telephone Encounter (Signed)
Pt calling due to the Furosemide since starting has to go to bathroom frequently pt complains that if has meetings does not take due to this Pt is wandering if could try and take prn or is there something else she could take >[facility] ./cy

## 2018-01-28 NOTE — Telephone Encounter (Signed)
The furosemide is only for symptom relief of edema, so if she wants to maybe cut back to every other day, she can try that. Make sure she's fluid restricting to less than 64 oz per day because if she is drinking more than that, she will only have to urinate it out. Kieth Hartis PA-C

## 2018-01-28 NOTE — Telephone Encounter (Signed)
Pt aware will make changes and will call back if no improvement ./cy

## 2018-02-01 DIAGNOSIS — Z1231 Encounter for screening mammogram for malignant neoplasm of breast: Secondary | ICD-10-CM | POA: Diagnosis not present

## 2018-02-26 DIAGNOSIS — R198 Other specified symptoms and signs involving the digestive system and abdomen: Secondary | ICD-10-CM | POA: Diagnosis not present

## 2018-02-26 DIAGNOSIS — R141 Gas pain: Secondary | ICD-10-CM | POA: Diagnosis not present

## 2018-04-01 ENCOUNTER — Telehealth: Payer: Self-pay | Admitting: Physician Assistant

## 2018-04-01 NOTE — Telephone Encounter (Signed)
Pt c/o BP issue: STAT if pt c/o blurred vision, one-sided weakness or slurred speech  1. What are your last 5 BP readings?  154/86    2. Are you having any other symptoms (ex. Dizziness, headache, blurred vision, passed out)? Dizzy light headed   3. What is your BP issue? ruining high

## 2018-04-01 NOTE — Telephone Encounter (Signed)
Called patient back about her message. Patient complaining of elevated BP (154/86), dizziness, and lightheadedness. Patient stated her BP is usually lower. Patient stated she is taking her metoprolol, but has not taken her lasix. Patient stated she does not take her lasix often because she has to go to the bathroom so much. Informed patient that she needs to take her Lasix, and that would help her BP. Patient stated she was taken off lisinopril 5 mg  in November, and she wanted to see about going back on lisinopril. Will send to Ronie Spies PA for advisement.

## 2018-04-03 NOTE — Telephone Encounter (Signed)
Patient reports her blood pressure is better. It's now running in the 130s/80s. She is taking her Lasix as well. She c/o mild dizziness at times and thinks it may be because she's dehydrated from too much urination. Instructed her to drink water - she should not be thirsty.  Offered to schedule patient for evaluation this week. She declined. She states she is feeling fine and will contact PCP if she starts to feel poorly.  She states she will wait for evaluation with Dr. Katrinka Blazing in early June. Instructed her to contact [facility] if she needs help prior to that time.

## 2018-04-03 NOTE — Telephone Encounter (Signed)
Agree with resumption of Lasix, would also see if she can either get in to our office or see PCP this week to f/u. Dayna Dunn PA-C

## 2018-04-03 NOTE — Telephone Encounter (Signed)
Left message to call back  

## 2018-05-07 NOTE — Progress Notes (Signed)
Cardiology Office Note    Date:  05/08/2018   ID:  Holly, Fields 10-03-1941, MRN [id number]  PCP:  Holly Rainier, MD  Cardiologist: Lesleigh Noe, MD   Chief Complaint  Patient presents with  . Irregular Heart Beat    PSVT  . Hypertension    History of Present Illness:  Holly Fields is a 77 y.o. female with history of essential HTN and recent poor control off diuretic, lymphedema, DM type II, CKD stage III, and prior history of PSVT treated with IV Identicard.  Sudden onset of PSVT in October 2018 at completion of her mother's birthday party which she had arranged at was under a lot of stress.  Into the emergency room and required adenosine to break.  Beta-blocker therapy started thereafter.  Work-up has not demonstrated any significant structural abnormality.  LVEF 70%.  She does have diastolic dysfunction.  She has had no recurrence of tachypalpitations since that time.  Furosemide started and HCTZ discontinued.  She has frequent urination but does not always take the furosemide because of this.      Past Medical History:  Diagnosis Date  . Arthritis   . CKD (chronic kidney disease), stage III [facility])     saw dr Lowell Guitar 2 years ago, now released from nephrology  . Complication of anesthesia 2010   "sluggish after anesthesia and had vertigo", slow to awaken after knee artroscopy 2012  . Diabetes (HCC)   . Eczema    at times  . GERD (gastroesophageal reflux disease)   . HTN (hypertension)   . Hypercholesteremia   . Lymphedema of leg    right more than RIGHT  . Narrowing of airway 2010  . Numbness and tingling of right leg   . Osteopenia   . PONV (postoperative nausea and vomiting)    pt has n/v and vertigo after anesthesia  . PSVT (paroxysmal supraventricular tachycardia) (HCC)   . Sinus tachycardia   . Vertigo    at times, cannot turn on left side quickly or sleep on left side  . Vitamin D deficiency     Past Surgical History:  Procedure Laterality  Date  . arthroscopic knee surgery Left 2012  . CARDIAC CATHETERIZATION  2012  . JOINT REPLACEMENT Right 2010  . LUMBAR LAMINECTOMY/DECOMPRESSION MICRODISCECTOMY N/A 10/22/2013   Procedure: MICROLUMBAR DECOMPRESSION LUMBAR TWO TO THREE, LUMBAR THREE TO FOUR and faramenotomy two,threeand four ,five bilateral;  Surgeon: Javier Docker, MD;  Location: [facility]S;  Service: Orthopedics;  Laterality: N/A;  . NASAL SINUS SURGERY  yrs ago    x 2  . TOTAL KNEE ARTHROPLASTY Left 03/01/2015   Procedure: LEFT TOTAL KNEE ARTHROPLASTY;  Surgeon: Ollen Gross, MD;  Location: [facility] ORS;  Service: Orthopedics;  Laterality: Left;    Current Medications: Outpatient Medications Prior to Visit  Medication Sig Dispense Refill  . aspirin 81 MG tablet Take 81 mg by mouth daily.    [name] Kitchen atorvastatin (LIPITOR) 10 MG tablet Take 10 mg by mouth every morning.     . Cholecalciferol (VITAMIN D3) 1000 units CAPS Take 1 capsule daily by mouth.     . denosumab (PROLIA) 60 MG/ML SOLN injection Inject 60 mg into the skin every 6 (six) months. Administer in upper arm, thigh, or abdomen    . fexofenadine (ALLEGRA) 180 MG tablet Take 90 mg daily as needed by mouth for allergies. Takes 1/2 tablet     . fluticasone (FLONASE) 50 MCG/ACT nasal spray Place 2 sprays  into the nose as needed for allergies.     . furosemide (LASIX) 20 MG tablet Take 20 mg by mouth daily.    [name] Kitchen JANUVIA 50 MG tablet Take 50 mg by mouth every morning.     . lansoprazole (PREVACID) 15 MG capsule Take 15 mg by mouth daily at 12 noon.    . triamcinolone cream (KENALOG) 0.1 % APPLY TO AFFECTED AREA UP TO TWICE A DAY AS NEEDED (NOT TO FACE,GROIN,UNDER ARMS)  3  . metoprolol succinate (TOPROL-XL) 25 MG 24 hr tablet Take 1 tablet (25 mg total) by mouth daily. 90 tablet 3  . furosemide (LASIX) 20 MG tablet Take 1 tablet (20 mg total) by mouth daily. 90 tablet 3  . tetrahydrozoline 0.05 % ophthalmic solution Place 1 drop into both eyes 2 (two) times daily as needed  (Allergies).     No facility-administered medications prior to visit.      Allergies:   Adenosine; Augmentin [amoxicillin-pot clavulanate]; Codeine; Doxycycline; Hydromorphone hcl; Metformin and related; Oxycodone; and Tramadol   Social History   Socioeconomic History  . Marital status: Married    Spouse name: Not on file  . Number of children: Not on file  . Years of education: Not on file  . Highest education level: Not on file  Occupational History  . Not on file  Social Needs  . Financial resource strain: Not on file  . Food insecurity:    Worry: Not on file    Inability: Not on file  . Transportation needs:    Medical: Not on file    Non-medical: Not on file  Tobacco Use  . Smoking status: Former Smoker    Packs/day: 0.25    Years: 15.00    Pack years: 3.75    Types: Cigarettes    Last attempt to quit: 12/04/1976    Years since quitting: 41.4  . Smokeless tobacco: Never Used  Substance and Sexual Activity  . Alcohol use: Yes    Comment: very occasional wine  . Drug use: No  . Sexual activity: Not on file  Lifestyle  . Physical activity:    Days per week: Not on file    Minutes per session: Not on file  . Stress: Not on file  Relationships  . Social connections:    Talks on phone: Not on file    Gets together: Not on file    Attends religious service: Not on file    Active member of club or organization: Not on file    Attends meetings of clubs or organizations: Not on file    Relationship status: Not on file  Other Topics Concern  . Not on file  Social History Narrative  . Not on file     Family History:  The patient's family history includes Arrhythmia in her father and mother; Heart attack in her father; Heart failure in her father; Hyperlipidemia in her mother; Hypertension in her mother.   ROS:   Please see the history of present illness.    Multiple complaints that have to do with lower extremity lymphedema, status post knee replacement surgeries,  leg swelling, neuropathy, difficulty with balance.  Relatively sedentary because of lower extremity issues. All other systems reviewed and are negative.   PHYSICAL EXAM:   VS:  BP (!) 144/80   Pulse 98   Ht 5\' 2"  (1.575 m)   Wt 211 lb 9.6 oz (96 kg)   BMI 38.70 kg/m    GEN: Well nourished, well  developed, in no acute distress  HEENT: normal  Neck: no JVD, carotid bruits, or masses Cardiac: RRR; no murmurs, rubs, or gallops,no edema  Respiratory:  clear to auscultation bilaterally, normal work of breathing GI: soft, nontender, nondistended, + BS MS: no deformity or atrophy  Skin: warm and dry, no rash Neuro:  Alert and Oriented x 3, Strength and sensation are intact Psych: euthymic mood, full affect  Wt Readings from Last 3 Encounters:  05/08/18 211 lb 9.6 oz (96 kg)  11/08/17 197 lb (89.4 kg)  10/23/17 195 lb 12.8 oz (88.8 kg)      Studies/Labs Reviewed:   EKG:  EKG  Not done  Recent Labs: 10/03/2017: Hemoglobin 14.6; Platelets 272; TSH 1.567 10/23/2017: ALT 14; NT-Pro BNP 68 11/15/2017: BUN 28; Creatinine, Ser 1.59; Potassium 4.3; Sodium 143   Lipid Panel No results found for: CHOL, TRIG, HDL, CHOLHDL, VLDL, LDLCALC, LDLDIRECT  Additional studies/ records that were reviewed today include:  2D Doppler echocardiogram November 2018:  Study Conclusions   - Left ventricle: The cavity size was normal. Wall thickness was   normal. Systolic function was vigorous. The estimated ejection   fraction was in the range of 65% to 70%. Wall motion was normal;   there were no regional wall motion abnormalities. Doppler   parameters are consistent with abnormal left ventricular   relaxation (grade 1 diastolic dysfunction). The E/e&' ratio is   between 8-15, suggesting indeterminate LV filling pressure. - Aortic valve: Trileaflet. Sclerosis without stenosis. There was   no regurgitation. - Left atrium: The atrium was normal in size. - Tricuspid valve: There was trivial  regurgitation. - Pulmonary arteries: PA peak pressure: 19 mm Hg (S). - Inferior vena cava: The vessel was normal in size. The   respirophasic diameter changes were in the normal range (>= 50%),   consistent with normal central venous pressure.   Impressions:   - LVEF 65-70%, normal wall thickness, normal wall motion, grade 1   DD, indeterminate LV filling, normal LA size, trivial TR, RVSP 19   mmHg, normal IVC.     ASSESSMENT:    1. PSVT (paroxysmal supraventricular tachycardia) (HCC)   2. Essential hypertension   3. Lymphedema      PLAN:  In order of problems listed above:  1. Increase Toprol to 50 mg/day.  Notify us if recurrent tachycardia.  Clinical follow-up in 1 year. 2. HCTZ 12.5 mg/day.  Basic metabolic panel in 2 weeks..  Blood pressure check.  If blood pressure clinic in 1 month. 3. Not addressed  Clinical follow-up in 1 year as needed    Medication Adjustments/Labs and Tests Ordered: Current medicines are reviewed at length with the patient today.  Concerns regarding medicines are outlined above.  Medication changes, Labs and Tests ordered today are listed in the Patient Instructions below. Patient Instructions  Medication Instructions:  1) START Hydrochlorothiazide 12.5mg  once daily 2) INCREASE Toprol XL to 50mg  once daily  Labwork: Your physician recommends that you return for lab work in: 2 weeks (BMET)   Testing/Procedures: None  Follow-Up: Your physician recommends that you schedule a follow-up appointment in: 1 month with the Hypertension Clinic or a PA or NP.  Your physician wants you to follow-up in: 1 year with Dr. Katrinka Blazing.  You will receive a reminder letter in the mail two months in advance. If you don't receive a letter, please call our office to schedule the follow-up appointment.    Any Other Special Instructions Will Be Listed Below (If  Applicable).     If you need a refill on your cardiac medications before your next appointment,  please call your pharmacy.      Signed, Lesleigh NoeHenry W Syanne Looney III, MD  05/08/2018 10:19 AM    [facility] [address], [place], [place]  [address] Phone: [phone]; Fax: [phone]

## 2018-05-08 ENCOUNTER — Encounter: Payer: Self-pay | Admitting: Interventional Cardiology

## 2018-05-08 ENCOUNTER — Ambulatory Visit (INDEPENDENT_AMBULATORY_CARE_PROVIDER_SITE_OTHER): Payer: Medicare HMO | Admitting: Interventional Cardiology

## 2018-05-08 VITALS — BP 144/80 | HR 98 | Ht 62.0 in | Wt 211.6 lb

## 2018-05-08 DIAGNOSIS — I890 Lymphedema, not elsewhere classified: Secondary | ICD-10-CM

## 2018-05-08 DIAGNOSIS — I10 Essential (primary) hypertension: Secondary | ICD-10-CM | POA: Diagnosis not present

## 2018-05-08 DIAGNOSIS — I471 Supraventricular tachycardia: Secondary | ICD-10-CM | POA: Diagnosis not present

## 2018-05-08 MED ORDER — METOPROLOL SUCCINATE ER 50 MG PO TB24: 50.0000 mg | ORAL_TABLET | Freq: Every day | ORAL | 3 refills | Status: DC

## 2018-05-08 MED ORDER — HYDROCHLOROTHIAZIDE 12.5 MG PO CAPS: 12.5000 mg | ORAL_CAPSULE | Freq: Every day | ORAL | 3 refills | Status: DC

## 2018-05-08 NOTE — Patient Instructions (Signed)
Medication Instructions:  1) START Hydrochlorothiazide 12.5mg  once daily 2) INCREASE Toprol XL to 50mg  once daily  Labwork: Your physician recommends that you return for lab work in: 2 weeks (BMET)   Testing/Procedures: None  Follow-Up: Your physician recommends that you schedule a follow-up appointment in: 1 month with the Hypertension Clinic or a PA or NP.  Your physician wants you to follow-up in: 1 year with Dr. Katrinka BlazingSmith.  You will receive a reminder letter in the mail two months in advance. If you don't receive a letter, please call our office to schedule the follow-up appointment.    Any Other Special Instructions Will Be Listed Below (If Applicable).     If you need a refill on your cardiac medications before your next appointment, please call your pharmacy.

## 2018-05-14 ENCOUNTER — Telehealth: Payer: Self-pay

## 2018-05-14 NOTE — Telephone Encounter (Signed)
Patient called requesting to get her prolia injection I informed the patient that I have sent her through the portal to get insurance verification but she is not due until the end of the month- she requested I call her cell phone [phone] to schedule when her SOB is back

## 2018-05-29 ENCOUNTER — Other Ambulatory Visit: Payer: Medicare HMO | Admitting: *Deleted

## 2018-05-29 DIAGNOSIS — I10 Essential (primary) hypertension: Secondary | ICD-10-CM

## 2018-05-30 LAB — BASIC METABOLIC PANEL
BUN/Creatinine Ratio: 14 (ref 12–28)
BUN: 21 mg/dL (ref 8–27)
CALCIUM: 9.4 mg/dL (ref 8.7–10.3)
CHLORIDE: 105 mmol/L (ref 96–106)
CO2: 22 mmol/L (ref 20–29)
Creatinine, Ser: 1.46 mg/dL — ABNORMAL HIGH (ref 0.57–1.00)
GFR calc non Af Amer: 35 mL/min/{1.73_m2} — ABNORMAL LOW (ref >59)
GFR, EST AFRICAN AMERICAN: 41 mL/min/{1.73_m2} — AB (ref >59)
Glucose: 112 mg/dL — ABNORMAL HIGH (ref 65–99)
POTASSIUM: 4 mmol/L (ref 3.5–5.2)
Sodium: 141 mmol/L (ref 134–144)

## 2018-05-31 DIAGNOSIS — H524 Presbyopia: Secondary | ICD-10-CM | POA: Diagnosis not present

## 2018-05-31 DIAGNOSIS — E119 Type 2 diabetes mellitus without complications: Secondary | ICD-10-CM | POA: Diagnosis not present

## 2018-05-31 DIAGNOSIS — H52223 Regular astigmatism, bilateral: Secondary | ICD-10-CM | POA: Diagnosis not present

## 2018-05-31 DIAGNOSIS — H5203 Hypermetropia, bilateral: Secondary | ICD-10-CM | POA: Diagnosis not present

## 2018-05-31 DIAGNOSIS — H2513 Age-related nuclear cataract, bilateral: Secondary | ICD-10-CM | POA: Diagnosis not present

## 2018-06-05 ENCOUNTER — Encounter: Payer: Self-pay | Admitting: Pharmacist

## 2018-06-05 ENCOUNTER — Ambulatory Visit (INDEPENDENT_AMBULATORY_CARE_PROVIDER_SITE_OTHER): Payer: Medicare HMO | Admitting: Pharmacist

## 2018-06-05 VITALS — BP 110/62 | HR 74

## 2018-06-05 DIAGNOSIS — I10 Essential (primary) hypertension: Secondary | ICD-10-CM

## 2018-06-05 NOTE — Progress Notes (Signed)
Patient ID: Holly CharMary J Staszak                 DOB: 11/07/1941                      MRN: [id number]     HPI: Holly Fields is a 77 y.o. female patient of Dr. Katrinka BlazingSmith who presents today for hypertension evaluation. PMH significant for essential HTN and recent poor control off diuretic, lymphedema, DM type II, CKD stage III, and prior history of PSVT treated with IV Identicard. At her most recent OV she was started on HCTZ and her Toprol was increased. BMET one week later was stable.   She presents today for BP management. Her blood sugars have increased since our last visit and she is very concerned about this. She also has been getting SOB occasionally since increasing metoprolol dose. Light headache at night. She has not had lightheadedness or dizziness since meeting with chiropractor. She denies chest pain.   She was taking allergy - D regularly but has not had any in about 1 month.   Current HTN meds:  Furosemide 20mg  daily - stopped by Dr. Katrinka BlazingSmith per patient Hydrochlorothiazide 12.5mg  daily in the morning Metoprolol succinate 50mg  daily in the morning  Previously tried: lisinopril - lightheadedness  BP goal: <130/80  Family History: Arrhythmia in her father and mother; Heart attack in her father; Heart failure in her father; Hyperlipidemia in her mother; Hypertension in her mother.   Social History: former smoker, occasional wine.   Diet: She eats out and from home. Eat out about 4 days a week When they eat out it is mostly from cafeteria. She does add light salt. She does use a lot of pepper. She occasionally used ketchup. She does like vegetables and eats them regularly. They do fry chicken, but mostly baked. They eat mostly chicken. 1 Cup of decaf coffee per morning. She admits to drinking less water than she should. She drinks tea or [employer] lemonade.   Exercise: She does not exercise regularly due to pain in legs knees and feet. She is meeting with chiropractor.   Home BP readings:  120-130/70-80  Wt Readings from Last 3 Encounters:  05/08/18 211 lb 9.6 oz (96 kg)  11/08/17 197 lb (89.4 kg)  10/23/17 195 lb 12.8 oz (88.8 kg)   BP Readings from Last 3 Encounters:  06/05/18 110/62  05/08/18 (!) 144/80  11/08/17 130/68   Pulse Readings from Last 3 Encounters:  06/05/18 74  05/08/18 98  11/08/17 76    Renal function: CrCl cannot be calculated (Unknown ideal weight.).  Past Medical History:  Diagnosis Date  . Arthritis   . CKD (chronic kidney disease), stage III [facility](HCC)     saw dr Lowell Guitarpowell 2 years ago, now released from nephrology  . Complication of anesthesia 2010   "sluggish after anesthesia and had vertigo", slow to awaken after knee artroscopy 2012  . Diabetes (HCC)   . Eczema    at times  . GERD (gastroesophageal reflux disease)   . HTN (hypertension)   . Hypercholesteremia   . Lymphedema of leg    right more than RIGHT  . Narrowing of airway 2010  . Numbness and tingling of right leg   . Osteopenia   . PONV (postoperative nausea and vomiting)    pt has n/v and vertigo after anesthesia  . PSVT (paroxysmal supraventricular tachycardia) (HCC)   . Sinus tachycardia   . Vertigo  at times, cannot turn on left side quickly or sleep on left side  . Vitamin D deficiency     Current Outpatient Medications on File Prior to Visit  Medication Sig Dispense Refill  . aspirin 81 MG tablet Take 81 mg by mouth daily.    [name] Kitchen atorvastatin (LIPITOR) 10 MG tablet Take 10 mg by mouth every morning.     . Cholecalciferol (VITAMIN D3) 1000 units CAPS Take 1 capsule daily by mouth.     . denosumab (PROLIA) 60 MG/ML SOLN injection Inject 60 mg into the skin every 6 (six) months. Administer in upper arm, thigh, or abdomen    . fexofenadine (ALLEGRA) 180 MG tablet Take 90 mg daily as needed by mouth for allergies. Takes 1/2 tablet     . fluticasone (FLONASE) 50 MCG/ACT nasal spray Place 2 sprays into the nose as needed for allergies.     [name] Kitchen JANUVIA 50 MG tablet Take 50 mg  by mouth every morning.     . lansoprazole (PREVACID) 15 MG capsule Take 15 mg by mouth daily at 12 noon.    . metoprolol succinate (TOPROL-XL) 25 MG 24 hr tablet Take 1 tablet (25 mg total) by mouth daily. Take with or immediately following a meal. 90 tablet 3  . triamcinolone cream (KENALOG) 0.1 % APPLY TO AFFECTED AREA UP TO TWICE A DAY AS NEEDED (NOT TO FACE,GROIN,UNDER ARMS)  3  . hydrochlorothiazide (HYDRODIURIL) 25 MG tablet Take 1 tablet (25 mg total) by mouth daily. 90 tablet 3   No current facility-administered medications on file prior to visit.     Allergies  Allergen Reactions  . Adenosine      bp plummets  . Augmentin [Amoxicillin-Pot Clavulanate] Hives  . Codeine Nausea And Vomiting  . Doxycycline Hives  . Hydromorphone Hcl Nausea And Vomiting  . Metformin And Related     diarrhea  . Oxycodone Nausea And Vomiting  . Tramadol Nausea And Vomiting    Blood pressure 110/62, pulse 74, SpO2 98 %.   Assessment/Plan: Hypertension: BP today is at goal. She would like to change medication due to blood sugars going up. Both HCTZ and metoprolol are rarely associated with changes in BS, but she feels it is most likely the metoprolol. She did ok on lower dose and she would like to go back on lower dose. Will decrease metoprolol back to 25mg  daily. Will increase HCTZ to 25mg  daily to compensate for dose decrease of metoprolol. Advised she continue to monitor pressures. Will repeat BMET in 1 week (unless Dr. Lowell Guitar checks at appt next week). Follow up in HTN clinic in 4-6 weeks.    Thank you, Freddie Apley. Cleatis Polka, PharmD  [facility]  06/05/2018 1:08 PM

## 2018-06-05 NOTE — Patient Instructions (Signed)
Blood work [employer]er[employer](BMET) in 1 week - call to cancel if Dr. Lowell GuitarPowell checks.   Your blood pressure goal is less than 130/80  Check your blood pressure at home daily (if able) and keep record of the readings.  Take your BP meds as follows: INCREASE hydrochlorothiazide to 25mg  (1 tablet of your current supply) daily  DECREASE metoprolol succinate to 25mg  (1/2 tablet of your current supply) daily   Bring all of your meds, your BP cuff and your record of home blood pressures to your next appointment.  Exercise as you're able, try to walk approximately 30 minutes per day.  Keep salt intake to a minimum, especially watch canned and prepared boxed foods.  Eat more fresh fruits and vegetables and fewer canned items.  Avoid eating in fast food restaurants.    HOW TO TAKE YOUR BLOOD PRESSURE: . Rest 5 minutes before taking your blood pressure. .  Don't smoke or drink caffeinated beverages for at least 30 minutes before. . Take your blood pressure before (not after) you eat. . Sit comfortably with your back supported and both feet on the floor (don't cross your legs). . Elevate your arm to heart level on a table or a desk. . Use the proper sized cuff. It should fit smoothly and snugly around your bare upper arm. There should be enough room to slip a fingertip under the cuff. The bottom edge of the cuff should be 1 inch above the crease of the elbow. . Ideally, take 3 measurements at one sitting and record the average.

## 2018-06-11 DIAGNOSIS — Z6836 Body mass index (BMI) 36.0-36.9, adult: Secondary | ICD-10-CM | POA: Diagnosis not present

## 2018-06-11 DIAGNOSIS — I129 Hypertensive chronic kidney disease with stage 1 through stage 4 chronic kidney disease, or unspecified chronic kidney disease: Secondary | ICD-10-CM | POA: Diagnosis not present

## 2018-06-11 DIAGNOSIS — N183 Chronic kidney disease, stage 3 (moderate): Secondary | ICD-10-CM | POA: Diagnosis not present

## 2018-06-14 ENCOUNTER — Encounter (INDEPENDENT_AMBULATORY_CARE_PROVIDER_SITE_OTHER): Payer: Self-pay

## 2018-06-14 ENCOUNTER — Other Ambulatory Visit: Payer: Medicare HMO

## 2018-06-14 DIAGNOSIS — I10 Essential (primary) hypertension: Secondary | ICD-10-CM

## 2018-06-14 LAB — BASIC METABOLIC PANEL
BUN/Creatinine Ratio: 13 (ref 12–28)
BUN: 21 mg/dL (ref 8–27)
CO2: 21 mmol/L (ref 20–29)
CREATININE: 1.59 mg/dL — AB (ref 0.57–1.00)
Calcium: 9.3 mg/dL (ref 8.7–10.3)
Chloride: 101 mmol/L (ref 96–106)
GFR calc Af Amer: 37 mL/min/{1.73_m2} — ABNORMAL LOW (ref >59)
GFR calc non Af Amer: 32 mL/min/{1.73_m2} — ABNORMAL LOW (ref >59)
GLUCOSE: 108 mg/dL — AB (ref 65–99)
Potassium: 4.1 mmol/L (ref 3.5–5.2)
SODIUM: 139 mmol/L (ref 134–144)

## 2018-07-01 ENCOUNTER — Telehealth: Payer: Self-pay

## 2018-07-01 NOTE — Telephone Encounter (Signed)
Called patient to schedule her for Prolia injection- LVM requesting patient call back to schedule this- she owes $0 and is ready to be schedule her nurse visit for injection

## 2018-07-03 ENCOUNTER — Ambulatory Visit (INDEPENDENT_AMBULATORY_CARE_PROVIDER_SITE_OTHER): Payer: Medicare HMO

## 2018-07-03 DIAGNOSIS — M810 Age-related osteoporosis without current pathological fracture: Secondary | ICD-10-CM | POA: Diagnosis not present

## 2018-07-03 MED ORDER — DENOSUMAB 60 MG/ML ~~LOC~~ SOSY
60.0000 mg | PREFILLED_SYRINGE | Freq: Once | SUBCUTANEOUS | Status: AC
  Administered 2018-07-03: 60 mg via SUBCUTANEOUS

## 2018-07-03 NOTE — Progress Notes (Signed)
Per orders of Dr. Elvera LennoxGherghe injection of prolia given today by Emmaline KluverSarah Terrell, CMA . Patient tolerated injection well.

## 2018-07-09 ENCOUNTER — Ambulatory Visit (INDEPENDENT_AMBULATORY_CARE_PROVIDER_SITE_OTHER): Payer: Medicare HMO | Admitting: Pharmacist

## 2018-07-09 VITALS — BP 112/70 | HR 76

## 2018-07-09 DIAGNOSIS — I10 Essential (primary) hypertension: Secondary | ICD-10-CM | POA: Diagnosis not present

## 2018-07-09 NOTE — Patient Instructions (Addendum)
It was great seeing you today!   Continue current regimen of hydrochlorothiazide 25 mg once daily and metoprolol succinate 25 mg once daily. Your current blood pressure goal <130/80. Try to keep a diary of your pain and meals and bring to your next visit. Also keep a log of your blood pressures and blood sugars. If you notice any abnormal trends (increased blood pressures, increasing blood sugars) give the clinic a call at [phone].

## 2018-07-09 NOTE — Progress Notes (Signed)
Patient ID: Holly Fields                 DOB: 06-16-1941                      MRN: [id number]     HPI: Holly Fields is a 77 y.o. female patient of Dr. Katrinka Blazing who presents today for hypertension follow up. PMH significant for essential HTN and recent poor control off diuretic, lymphedema, DM type II, CKD stage III, and prior history of PSVT treated with IV Identicard. At her most recent OV she preferred to decrease her metoprolol back to lower dose and increase her HCTZ due to dysregulation of blood sugars.   She presents today for BP management follow up. She is unhappy with her fasting BGs (120s). She denies any abnormal BPs, reports pressures are well controlled. Saw her nephrologist recently and he did not change any medications. Denies dizziness, SOB, chest pain. Endorses a lot of pain recently in her legs/feet. Very painful over the weekend but has improved over the past couple of days. Normally sees a chiropractor to help with her pain.  Current HTN meds:  Hydrochlorothiazide 25mg  daily in the morning Metoprolol succinate 25mg  daily in the morning  Previously tried: lisinopril - lightheadedness  BP goal: <130/80  Family History: Arrhythmia in her father and mother; Heart attack in her father; Heart failure in her father; Hyperlipidemia in her mother; Hypertension in her mother.   Social History: former smoker, occasional wine.   Diet: Has been on vacation over past few weeks. Usually eats lots of vegetables at home. On vacation, ate more carbs (pasta, sandwiches, fruit) than normal meals.    Exercise: Has endorsed more exercise due to being on vacation over past few weeks.  Home BP readings: SBP: 114, 117, 120, 124 DBP: 80s on home arm cuff  Wt Readings from Last 3 Encounters:  05/08/18 211 lb 9.6 oz (96 kg)  11/08/17 197 lb (89.4 kg)  10/23/17 195 lb 12.8 oz (88.8 kg)   BP Readings from Last 3 Encounters:  07/09/18 112/70  06/05/18 110/62  05/08/18 (!) 144/80   Pulse  Readings from Last 3 Encounters:  07/09/18 76  06/05/18 74  05/08/18 98    Renal function: CrCl cannot be calculated (Patient's most recent lab result is older than the maximum 21 days allowed.).  Past Medical History:  Diagnosis Date  . Arthritis   . CKD (chronic kidney disease), stage III [facility])     saw dr Lowell Guitar 2 years ago, now released from nephrology  . Complication of anesthesia 2010   "sluggish after anesthesia and had vertigo", slow to awaken after knee artroscopy 2012  . Diabetes (HCC)   . Eczema    at times  . GERD (gastroesophageal reflux disease)   . HTN (hypertension)   . Hypercholesteremia   . Lymphedema of leg    right more than RIGHT  . Narrowing of airway 2010  . Numbness and tingling of right leg   . Osteopenia   . PONV (postoperative nausea and vomiting)    pt has n/v and vertigo after anesthesia  . PSVT (paroxysmal supraventricular tachycardia) (HCC)   . Sinus tachycardia   . Vertigo    at times, cannot turn on left side quickly or sleep on left side  . Vitamin D deficiency     Current Outpatient Medications on File Prior to Visit  Medication Sig Dispense Refill  . aspirin 81 MG  tablet Take 81 mg by mouth daily.    [name] Kitchen. atorvastatin (LIPITOR) 10 MG tablet Take 10 mg by mouth every morning.     . Cholecalciferol (VITAMIN D3) 1000 units CAPS Take 1 capsule by mouth 3 (three) times a week.     . denosumab (PROLIA) 60 MG/ML SOLN injection Inject 60 mg into the skin every 6 (six) months. Administer in upper arm, thigh, or abdomen    . fexofenadine (ALLEGRA) 180 MG tablet Take 90 mg daily as needed by mouth for allergies. Takes 1/2 tablet     . fluticasone (FLONASE) 50 MCG/ACT nasal spray Place 2 sprays into the nose as needed for allergies.     . hydrochlorothiazide (HYDRODIURIL) 25 MG tablet Take 1 tablet (25 mg total) by mouth daily. 90 tablet 3  . JANUVIA 50 MG tablet Take 50 mg by mouth every morning.     . lansoprazole (PREVACID) 15 MG capsule Take 15 mg  by mouth daily at 12 noon.    . metoprolol succinate (TOPROL-XL) 25 MG 24 hr tablet Take 1 tablet (25 mg total) by mouth daily. Take with or immediately following a meal. 90 tablet 3  . triamcinolone cream (KENALOG) 0.1 % APPLY TO AFFECTED AREA UP TO TWICE A DAY AS NEEDED (NOT TO FACE,GROIN,UNDER ARMS)  3   No current facility-administered medications on file prior to visit.     Allergies  Allergen Reactions  . Adenosine      bp plummets  . Augmentin [Amoxicillin-Pot Clavulanate] Hives  . Codeine Nausea And Vomiting  . Doxycycline Hives  . Hydromorphone Hcl Nausea And Vomiting  . Metformin And Related     diarrhea  . Oxycodone Nausea And Vomiting  . Tramadol Nausea And Vomiting    Blood pressure 112/70, pulse 76, SpO2 98 %.   Assessment/Plan: Hypertension: BP currently controlled. Educated patient on keeping pain and diet diary to look at potential BG triggers. Encouraged patient to also log her BPs and BGs. Told patient to call HTN clinic if patient keeps experiencing BGs trending up and any high BPs. Follow-up with Dr. Katrinka BlazingSmith as scheduled and HTN clinic as needed.    Thank you, Freddie ApleyKelley M. Cleatis PolkaAuten, PharmD  [facility]  07/09/2018 11:18 AM  This patient was seen with Thomes CakeWendy Sun, PharmD Candidate [phone]

## 2018-07-18 DIAGNOSIS — L0390 Cellulitis, unspecified: Secondary | ICD-10-CM | POA: Diagnosis not present

## 2018-07-23 DIAGNOSIS — E119 Type 2 diabetes mellitus without complications: Secondary | ICD-10-CM | POA: Diagnosis not present

## 2018-07-23 DIAGNOSIS — H25013 Cortical age-related cataract, bilateral: Secondary | ICD-10-CM | POA: Diagnosis not present

## 2018-07-23 DIAGNOSIS — H2513 Age-related nuclear cataract, bilateral: Secondary | ICD-10-CM | POA: Diagnosis not present

## 2018-07-23 DIAGNOSIS — H25043 Posterior subcapsular polar age-related cataract, bilateral: Secondary | ICD-10-CM | POA: Diagnosis not present

## 2018-07-23 DIAGNOSIS — H2511 Age-related nuclear cataract, right eye: Secondary | ICD-10-CM | POA: Diagnosis not present

## 2018-08-29 DIAGNOSIS — Z23 Encounter for immunization: Secondary | ICD-10-CM | POA: Diagnosis not present

## 2018-09-06 DIAGNOSIS — M8589 Other specified disorders of bone density and structure, multiple sites: Secondary | ICD-10-CM | POA: Diagnosis not present

## 2018-09-17 ENCOUNTER — Encounter: Payer: Self-pay | Admitting: Internal Medicine

## 2018-09-17 ENCOUNTER — Telehealth: Payer: Self-pay

## 2018-09-17 NOTE — Telephone Encounter (Signed)
-----   Message from Carlus Pavlov, MD sent at 09/17/2018 12:27 PM EDT ----- Efraim Kaufmann, can you please call pt: I just received the results of her most recent bone density scores.  They all appear improved.  Great news! Ty, C

## 2018-09-17 NOTE — Progress Notes (Addendum)
Received DXA scan results from 09/06/2018 from [facility] mammography:  Results: 09/06/2018  Lumbar spine L1-L4 Femoral neck (FN)  T-score  -1.20 RFN: -2.20  LFN: -2.00  Change in BMD from previous DXA test (%) 16% increase  2% increase in RFN, 5% increase in LFN   Her bone density scores are all improved.

## 2018-09-18 NOTE — Telephone Encounter (Signed)
Notified patient of message from Dr. Gherghe, patient expressed understanding and agreement. No further questions.  

## 2018-09-20 DIAGNOSIS — E782 Mixed hyperlipidemia: Secondary | ICD-10-CM | POA: Diagnosis not present

## 2018-09-20 DIAGNOSIS — E1139 Type 2 diabetes mellitus with other diabetic ophthalmic complication: Secondary | ICD-10-CM | POA: Diagnosis not present

## 2018-09-20 DIAGNOSIS — E559 Vitamin D deficiency, unspecified: Secondary | ICD-10-CM | POA: Diagnosis not present

## 2018-09-20 DIAGNOSIS — M810 Age-related osteoporosis without current pathological fracture: Secondary | ICD-10-CM | POA: Diagnosis not present

## 2018-09-20 DIAGNOSIS — N183 Chronic kidney disease, stage 3 (moderate): Secondary | ICD-10-CM | POA: Diagnosis not present

## 2018-09-20 LAB — LIPID PANEL
Cholesterol: 149 (ref 0–200)
HDL: 51 (ref 35–70)
LDL Cholesterol: 68
Triglycerides: 146 (ref 40–160)

## 2018-09-23 DIAGNOSIS — Z9841 Cataract extraction status, right eye: Secondary | ICD-10-CM | POA: Diagnosis not present

## 2018-09-23 DIAGNOSIS — H52221 Regular astigmatism, right eye: Secondary | ICD-10-CM | POA: Diagnosis not present

## 2018-09-23 DIAGNOSIS — H2511 Age-related nuclear cataract, right eye: Secondary | ICD-10-CM | POA: Diagnosis not present

## 2018-09-23 DIAGNOSIS — Z961 Presence of intraocular lens: Secondary | ICD-10-CM | POA: Diagnosis not present

## 2018-09-24 DIAGNOSIS — H2512 Age-related nuclear cataract, left eye: Secondary | ICD-10-CM | POA: Diagnosis not present

## 2018-09-27 DIAGNOSIS — I10 Essential (primary) hypertension: Secondary | ICD-10-CM | POA: Diagnosis not present

## 2018-09-27 DIAGNOSIS — I890 Lymphedema, not elsewhere classified: Secondary | ICD-10-CM | POA: Diagnosis not present

## 2018-09-27 DIAGNOSIS — E782 Mixed hyperlipidemia: Secondary | ICD-10-CM | POA: Diagnosis not present

## 2018-09-27 DIAGNOSIS — Z7984 Long term (current) use of oral hypoglycemic drugs: Secondary | ICD-10-CM | POA: Diagnosis not present

## 2018-09-27 DIAGNOSIS — E1139 Type 2 diabetes mellitus with other diabetic ophthalmic complication: Secondary | ICD-10-CM | POA: Diagnosis not present

## 2018-09-27 DIAGNOSIS — M48061 Spinal stenosis, lumbar region without neurogenic claudication: Secondary | ICD-10-CM | POA: Diagnosis not present

## 2018-09-27 DIAGNOSIS — Z0000 Encounter for general adult medical examination without abnormal findings: Secondary | ICD-10-CM | POA: Diagnosis not present

## 2018-09-27 DIAGNOSIS — E559 Vitamin D deficiency, unspecified: Secondary | ICD-10-CM | POA: Diagnosis not present

## 2018-09-27 DIAGNOSIS — N183 Chronic kidney disease, stage 3 (moderate): Secondary | ICD-10-CM | POA: Diagnosis not present

## 2018-09-27 DIAGNOSIS — E1165 Type 2 diabetes mellitus with hyperglycemia: Secondary | ICD-10-CM | POA: Diagnosis not present

## 2018-10-03 ENCOUNTER — Encounter: Payer: Self-pay | Admitting: Internal Medicine

## 2018-10-14 DIAGNOSIS — H2512 Age-related nuclear cataract, left eye: Secondary | ICD-10-CM | POA: Diagnosis not present

## 2019-02-04 ENCOUNTER — Telehealth: Payer: Self-pay | Admitting: Internal Medicine

## 2019-02-04 DIAGNOSIS — Z1231 Encounter for screening mammogram for malignant neoplasm of breast: Secondary | ICD-10-CM | POA: Diagnosis not present

## 2019-02-04 NOTE — Telephone Encounter (Signed)
Patient wants to know about her Prolia shot, when she is scheduled to have her next, and about cost  Please contact her at [phone]

## 2019-02-05 NOTE — Telephone Encounter (Signed)
Have contacted [employer] to request they fax over PA form for her Prolia was told it was faxed over on 01/05/19 but we never received it

## 2019-02-05 NOTE — Telephone Encounter (Signed)
Left a VM making patient aware that I am waiting for insurance company to fax over PA form and once this is done I will call her to schedule a nurse visit

## 2019-02-12 NOTE — Telephone Encounter (Signed)
PA form filled out and has been faxed to [employer]

## 2019-02-21 ENCOUNTER — Telehealth: Payer: Self-pay | Admitting: Internal Medicine

## 2019-02-21 NOTE — Telephone Encounter (Signed)
[employer] Assist ph# [phone] called requesting Misty Stanley call them back re: PA request for Prolia

## 2019-02-27 ENCOUNTER — Ambulatory Visit (INDEPENDENT_AMBULATORY_CARE_PROVIDER_SITE_OTHER): Payer: Medicare HMO

## 2019-02-27 ENCOUNTER — Other Ambulatory Visit: Payer: Self-pay

## 2019-02-27 DIAGNOSIS — M810 Age-related osteoporosis without current pathological fracture: Secondary | ICD-10-CM | POA: Diagnosis not present

## 2019-02-27 MED ORDER — DENOSUMAB 60 MG/ML ~~LOC~~ SOSY
60.0000 mg | PREFILLED_SYRINGE | Freq: Once | SUBCUTANEOUS | Status: AC
  Administered 2019-02-27: 60 mg via SUBCUTANEOUS

## 2019-02-27 NOTE — Progress Notes (Signed)
Per orders of Dr. Elvera Lennox injection of Prolia given today by L. Markell Schrier . Patient tolerated injection well.

## 2019-03-27 NOTE — Telephone Encounter (Signed)
Patient has received Prolia this has been resolved

## 2019-04-25 ENCOUNTER — Telehealth: Payer: Self-pay

## 2019-04-25 NOTE — Telephone Encounter (Signed)
YOUR CARDIOLOGY TEAM HAS ARRANGED FOR AN E-VISIT FOR YOUR APPOINTMENT - PLEASE REVIEW IMPORTANT INFORMATION BELOW SEVERAL DAYS PRIOR TO YOUR APPOINTMENT  Due to the recent COVID-19 pandemic, we are transitioning in-person office visits to tele-medicine visits in an effort to decrease unnecessary exposure to our patients, their families, and staff. These visits are billed to your insurance just like a normal visit is. We also encourage you to sign up for MyChart if you have not already done so. You will need a smartphone if possible. For patients that do not have this, we can still complete the visit using a regular telephone but do prefer a smartphone to enable video when possible. You may have a family member that lives with you that can help. If possible, we also ask that you have a blood pressure cuff and scale at home to measure your blood pressure, heart rate and weight prior to your scheduled appointment. Patients with clinical needs that need an in-person evaluation and testing will still be able to come to the office if absolutely necessary. If you have any questions, feel free to call our office.     YOUR PROVIDER WILL BE USING THE FOLLOWING PLATFORM TO COMPLETE YOUR VISIT: Doximity  . IF USING MYCHART - How to Download the MyChart App to Your SmartPhone   - If Apple, go to App Store and type in MyChart in the search bar and download the app. If Android, ask patient to go to Google Play Store and type in MyChart in the search bar and download the app. The app is free but as with any other app downloads, your phone may require you to verify saved payment information or Apple/Android password.  - You will need to then log into the app with your MyChart username and password, and select [place] as your healthcare provider to link the account.  - When it is time for your visit, go to the MyChart app, find appointments, and click Begin Video Visit. Be sure to Select Allow for your device to  access the Microphone and Camera for your visit. You will then be connected, and your provider will be with you shortly.  **If you have any issues connecting or need assistance, please contact MyChart service desk [phone] ([phone])**  **If using a computer, in order to ensure the best quality for your visit, you will need to use either of the following Internet Browsers: Google Chrome or Microsoft Edge**  . IF USING DOXIMITY or [email or web address].ME - The staff will give you instructions on receiving your link to join the meeting the day of your visit.      2-3 DAYS BEFORE YOUR APPOINTMENT  You will receive a telephone call from one of our [facility] team members - your caller ID may say "Unknown caller." If this is a video visit, we will walk you through how to get the video launched on your phone. We will remind you check your blood pressure, heart rate and weight prior to your scheduled appointment. If you have an Apple Watch or Kardia, please upload any pertinent ECG strips the day before or morning of your appointment to MyChart. Our staff will also make sure you have reviewed the consent and agree to move forward with your scheduled tele-health visit.     THE DAY OF YOUR APPOINTMENT  Approximately 15 minutes prior to your scheduled appointment, you will receive a telephone call from one of Heart[facility] team - your caller ID may say "Unknown caller."    Our staff will confirm medications, vital signs for the day and any symptoms you may be experiencing. Please have this information available prior to the time of visit start. It may also be helpful for you to have a pad of paper and pen handy for any instructions given during your visit. They will also walk you through joining the smartphone meeting if this is a video visit.    CONSENT FOR TELE-HEALTH VISIT - PLEASE REVIEW  I hereby voluntarily request, consent and authorize [facility] and its employed or contracted physicians, physician  assistants, nurse practitioners or other licensed health care professionals (the Practitioner), to provide me with telemedicine health care services (the "Services") as deemed necessary by the treating Practitioner. I acknowledge and consent to receive the Services by the Practitioner via telemedicine. I understand that the telemedicine visit will involve communicating with the Practitioner through live audiovisual communication technology and the disclosure of certain medical information by electronic transmission. I acknowledge that I have been given the opportunity to request an in-person assessment or other available alternative prior to the telemedicine visit and am voluntarily participating in the telemedicine visit.  I understand that I have the right to withhold or withdraw my consent to the use of telemedicine in the course of my care at any time, without affecting my right to future care or treatment, and that the Practitioner or I may terminate the telemedicine visit at any time. I understand that I have the right to inspect all information obtained and/or recorded in the course of the telemedicine visit and may receive copies of available information for a reasonable fee.  I understand that some of the potential risks of receiving the Services via telemedicine include:  . Delay or interruption in medical evaluation due to technological equipment failure or disruption; . Information transmitted may not be sufficient (e.g. poor resolution of images) to allow for appropriate medical decision making by the Practitioner; and/or  . In rare instances, security protocols could fail, causing a breach of personal health information.  Furthermore, I acknowledge that it is my responsibility to provide information about my medical history, conditions and care that is complete and accurate to the best of my ability. I acknowledge that Practitioner's advice, recommendations, and/or decision may be based on  factors not within their control, such as incomplete or inaccurate data provided by me or distortions of diagnostic images or specimens that may result from electronic transmissions. I understand that the practice of medicine is not an exact science and that Practitioner makes no warranties or guarantees regarding treatment outcomes. I acknowledge that I will receive a copy of this consent concurrently upon execution via email to the email address I last provided but may also request a printed copy by calling the office of [facility].    I understand that my insurance will be billed for this visit.   I have read or had this consent read to me. . I understand the contents of this consent, which adequately explains the benefits and risks of the Services being provided via telemedicine.  . I have been provided ample opportunity to ask questions regarding this consent and the Services and have had my questions answered to my satisfaction. . I give my informed consent for the services to be provided through the use of telemedicine in my medical care  By participating in this telemedicine visit I agree to the above.  

## 2019-04-29 DIAGNOSIS — R509 Fever, unspecified: Secondary | ICD-10-CM | POA: Diagnosis not present

## 2019-04-29 DIAGNOSIS — L03115 Cellulitis of right lower limb: Secondary | ICD-10-CM | POA: Diagnosis not present

## 2019-04-29 DIAGNOSIS — I890 Lymphedema, not elsewhere classified: Secondary | ICD-10-CM | POA: Diagnosis not present

## 2019-05-01 ENCOUNTER — Other Ambulatory Visit: Payer: Self-pay

## 2019-05-01 ENCOUNTER — Encounter: Payer: Self-pay | Admitting: Interventional Cardiology

## 2019-05-01 ENCOUNTER — Telehealth (INDEPENDENT_AMBULATORY_CARE_PROVIDER_SITE_OTHER): Payer: Medicare HMO | Admitting: Interventional Cardiology

## 2019-05-01 VITALS — BP 120/82 | HR 85 | Temp 96.8°F | Ht 62.0 in | Wt 208.0 lb

## 2019-05-01 DIAGNOSIS — N183 Chronic kidney disease, stage 3 unspecified: Secondary | ICD-10-CM

## 2019-05-01 DIAGNOSIS — E118 Type 2 diabetes mellitus with unspecified complications: Secondary | ICD-10-CM

## 2019-05-01 DIAGNOSIS — I471 Supraventricular tachycardia, unspecified: Secondary | ICD-10-CM

## 2019-05-01 DIAGNOSIS — E785 Hyperlipidemia, unspecified: Secondary | ICD-10-CM

## 2019-05-01 DIAGNOSIS — Z7189 Other specified counseling: Secondary | ICD-10-CM

## 2019-05-01 DIAGNOSIS — I10 Essential (primary) hypertension: Secondary | ICD-10-CM

## 2019-05-01 MED ORDER — HYDROCHLOROTHIAZIDE 25 MG PO TABS: 25.0000 mg | ORAL_TABLET | Freq: Every day | ORAL | 3 refills | Status: AC

## 2019-05-01 MED ORDER — METOPROLOL SUCCINATE ER 25 MG PO TB24: 25.0000 mg | ORAL_TABLET | Freq: Every day | ORAL | 3 refills | Status: AC

## 2019-05-01 NOTE — Progress Notes (Signed)
Virtual Visit via Video Note   This visit type was conducted due to national recommendations for restrictions regarding the COVID-19 Pandemic (e.g. social distancing) in an effort to limit this patient's exposure and mitigate transmission in our community.  Due to her co-morbid illnesses, this patient is at least at moderate risk for complications without adequate follow up.  This format is felt to be most appropriate for this patient at this time.  All issues noted in this document were discussed and addressed.  A limited physical exam was performed with this format.  Please refer to the patient's chart for her consent to telehealth for [facility].   Date:  05/01/2019   ID:  Holly Fields, Holly Fields 01/26/1941, MRN [id number]  Patient Location: Home Provider Location: Office  PCP:  [name], MD  Cardiologist:  No primary care provider on file.  Electrophysiologist:  None   Evaluation Performed:  Follow-Up Visit  Chief Complaint:  PAT/DM II/ CAD equivalent  History of Present Illness:    Holly Fields is a 78 y.o. female with with history ofessential HTN , hyperlipidemia, Lymphedema, DM type II, CKD stage III, and prior history of PSVT treated with IV adenacard..  She has not had a recurrence of PSVT.  Tolerating low-dose beta-blocker without difficulty.  She denies chest discomfort but does have dyspnea on exertion.  DOE has not changed.  She is not able to exercise related to the lymphedema in her legs.  She denies orthopnea.  The patient does have symptoms concerning for COVID-19 infection (fever, chills, cough, or new shortness of breath).    Past Medical History:  Diagnosis Date  . Arthritis   . CKD (chronic kidney disease), stage III [facility])     saw dr Lowell Guitar 2 years ago, now released from nephrology  . Complication of anesthesia 2010   "sluggish after anesthesia and had vertigo", slow to awaken after knee artroscopy 2012  . Diabetes (HCC)   . Eczema    at times  .  GERD (gastroesophageal reflux disease)   . HTN (hypertension)   . Hypercholesteremia   . Lymphedema of leg    right more than RIGHT  . Narrowing of airway 2010  . Numbness and tingling of right leg   . Osteopenia   . PONV (postoperative nausea and vomiting)    pt has n/v and vertigo after anesthesia  . PSVT (paroxysmal supraventricular tachycardia) (HCC)   . Sinus tachycardia   . Vertigo    at times, cannot turn on left side quickly or sleep on left side  . Vitamin D deficiency    Past Surgical History:  Procedure Laterality Date  . arthroscopic knee surgery Left 2012  . CARDIAC CATHETERIZATION  2012  . JOINT REPLACEMENT Right 2010  . LUMBAR LAMINECTOMY/DECOMPRESSION MICRODISCECTOMY N/A 10/22/2013   Procedure: MICROLUMBAR DECOMPRESSION LUMBAR TWO TO THREE, LUMBAR THREE TO FOUR and faramenotomy two,threeand four ,five bilateral;  Surgeon: Javier Docker, MD;  Location: [facility];  Service: Orthopedics;  Laterality: N/A;  . NASAL SINUS SURGERY  yrs ago    x 2  . TOTAL KNEE ARTHROPLASTY Left 03/01/2015   Procedure: LEFT TOTAL KNEE ARTHROPLASTY;  Surgeon: Ollen Gross, MD;  Location: [facility];  Service: Orthopedics;  Laterality: Left;     Current Meds  Medication Sig  . aspirin 81 MG tablet Take 81 mg by mouth daily.  [name] Kitchen atorvastatin (LIPITOR) 10 MG tablet Take 10 mg by mouth every morning.   . Cholecalciferol (VITAMIN D3)  1000 units CAPS Take 1 capsule by mouth 3 (three) times a week.   . denosumab (PROLIA) 60 MG/ML SOLN injection Inject 60 mg into the skin every 6 (six) months. Administer in upper arm, thigh, or abdomen  . fexofenadine (ALLEGRA) 180 MG tablet Take 90 mg daily as needed by mouth for allergies. Takes 1/2 tablet   . fluticasone (FLONASE) 50 MCG/ACT nasal spray Place 2 sprays into the nose as needed for allergies.   . furosemide (LASIX) 20 MG tablet Take 20 mg by mouth daily.  . hydrochlorothiazide (HYDRODIURIL) 25 MG tablet Take 1 tablet (25 mg total) by mouth daily.  [name] Kitchen  JANUVIA 50 MG tablet Take 50 mg by mouth every morning.   . lansoprazole (PREVACID) 15 MG capsule Take 15 mg by mouth daily at 12 noon.  . metoprolol succinate (TOPROL-XL) 25 MG 24 hr tablet Take 1 tablet (25 mg total) by mouth daily. Take with or immediately following a meal.  . sulfamethoxazole-trimethoprim (BACTRIM DS) 800-160 MG tablet Take 1 tablet by mouth 2 (two) times a day.  . triamcinolone cream (KENALOG) 0.1 % APPLY TO AFFECTED AREA UP TO TWICE A DAY AS NEEDED (NOT TO FACE,GROIN,UNDER ARMS)     Allergies:   Adenosine; Augmentin [amoxicillin-pot clavulanate]; Codeine; Doxycycline; Hydromorphone hcl; Metformin and related; Oxycodone; and Tramadol   Social History   Tobacco Use  . Smoking status: Former Smoker    Packs/day: 0.25    Years: 15.00    Pack years: 3.75    Types: Cigarettes    Last attempt to quit: 12/04/1976    Years since quitting: 42.4  . Smokeless tobacco: Never Used  Substance Use Topics  . Alcohol use: Yes    Comment: very occasional wine  . Drug use: No     Family Hx: The patient's family history includes Arrhythmia in her father and mother; Heart attack in her father; Heart failure in her father; Hyperlipidemia in her mother; Hypertension in her mother.  ROS:   Please see the history of present illness.    Lymphedema associated with cellulitis last summer and again this spring.  Currently on antibiotic therapy.  Otherwise no complaints. All other systems reviewed and are negative.   Prior CV studies:   The following studies were reviewed today:  No new CV studies or imaging.  Labs/Other Tests and Data Reviewed:    EKG:  No ECG reviewed.  Recent Labs: 06/14/2018: BUN 21; Creatinine, Ser 1.59; Potassium 4.1; Sodium 139   Recent Lipid Panel Lab Results  Component Value Date/Time   CHOL 149 09/20/2018   TRIG 146 09/20/2018   HDL 51 09/20/2018   LDLCALC 68 09/20/2018    Wt Readings from Last 3 Encounters:  05/01/19 208 lb (94.3 kg)   05/08/18 211 lb 9.6 oz (96 kg)  11/08/17 197 lb (89.4 kg)     Objective:    Vital Signs:  BP 120/82   Pulse 85   Temp (!) 96.8 F (36 C)   Ht 5\' 2"  (1.575 m)   Wt 208 lb (94.3 kg)   BMI 38.04 kg/m    VITAL SIGNS:  reviewed GEN:  Overweight RESPIRATORY:  normal respiratory effort, symmetric expansion CARDIOVASCULAR:  no peripheral edema NEURO:  alert and oriented x 3, no obvious focal deficit  ASSESSMENT & PLAN:    1. PSVT (paroxysmal supraventricular tachycardia) (HCC)   2. Essential hypertension   3. CKD (chronic kidney disease), stage III (HCC)   4. Hyperlipidemia LDL goal <70  5. Type 2 diabetes mellitus with complication, without long-term current use of insulin (HCC)   6. Educated About Covid-19 Virus Infection    PLAN:  1. Stable on low-dose beta-blocker therapy. 2. We discussed target blood pressure 130/80 mmHg.  Also discussed lifestyle changes to improve blood pressure control. 3. Followed by primary and nephrology.  This is a risk modifier which increases risk of vascular events over time. 4. LDL target should be less than 70.  We discussed this in detail. 5. A1C target is less than 7.  Her diabetes is a cardiovascular risk equivalent.  Primary prevention of overt vascular events was discussed in detail.  Overall education and awareness concerning primary/secondary risk prevention was discussed in detail: LDL less than 70, hemoglobin A1c less than 7, blood pressure target less than 130/80 mmHg, >150 minutes of moderate aerobic activity per week, avoidance of smoking, weight control (via diet and exercise), and continued surveillance/management of/for obstructive sleep apnea.  Target BP: <130/80 mmHg  Diet and lifestyle measures for BP control were reviewed in detail: Low sodium diet (<2.5 gm daily); alcohol restriction (<3 ounces per day); weight loss (Mediterranean); avoid non-steroidal agents; > 6 hours sleep per day; 150 min moderate exercise per week.      COVID-19 Education: The signs and symptoms of COVID-19 were discussed with the patient and how to seek care for testing (follow up with PCP or arrange E-visit).  The importance of social distancing was discussed today.  Time:   Today, I have spent 15 minutes with the patient with telehealth technology discussing the above problems.     Medication Adjustments/Labs and Tests Ordered: Current medicines are reviewed at length with the patient today.  Concerns regarding medicines are outlined above.   Tests Ordered: No orders of the defined types were placed in this encounter.   Medication Changes: No orders of the defined types were placed in this encounter.   Disposition:  Follow up in 1 year(s)  Signed, Lesleigh NoeHenry W Smith III, MD  05/01/2019 2:13 PM    [facility]

## 2019-05-01 NOTE — Patient Instructions (Signed)

## 2019-05-01 NOTE — Addendum Note (Signed)
Addended by: Julio Sicks on: 05/01/2019 02:24 PM   Modules accepted: Orders

## 2019-05-05 DIAGNOSIS — Z79899 Other long term (current) drug therapy: Secondary | ICD-10-CM | POA: Diagnosis not present

## 2019-05-05 DIAGNOSIS — I10 Essential (primary) hypertension: Secondary | ICD-10-CM | POA: Diagnosis not present

## 2019-05-05 DIAGNOSIS — I890 Lymphedema, not elsewhere classified: Secondary | ICD-10-CM | POA: Diagnosis not present

## 2019-05-06 DIAGNOSIS — I890 Lymphedema, not elsewhere classified: Secondary | ICD-10-CM | POA: Diagnosis not present

## 2019-05-06 DIAGNOSIS — N183 Chronic kidney disease, stage 3 (moderate): Secondary | ICD-10-CM | POA: Diagnosis not present

## 2019-05-08 ENCOUNTER — Ambulatory Visit: Payer: Medicare HMO | Admitting: Interventional Cardiology

## 2019-05-12 DIAGNOSIS — I890 Lymphedema, not elsewhere classified: Secondary | ICD-10-CM | POA: Diagnosis not present

## 2019-05-12 DIAGNOSIS — N183 Chronic kidney disease, stage 3 (moderate): Secondary | ICD-10-CM | POA: Diagnosis not present

## 2019-05-12 DIAGNOSIS — I129 Hypertensive chronic kidney disease with stage 1 through stage 4 chronic kidney disease, or unspecified chronic kidney disease: Secondary | ICD-10-CM | POA: Diagnosis not present

## 2019-05-28 ENCOUNTER — Encounter: Payer: Self-pay | Admitting: Internal Medicine

## 2019-05-28 NOTE — Progress Notes (Signed)
Received labs from nephrologist, Dr. Pearson Grippe: -Kidney function improved: BUN/creatinine 21/1.48, GFR 40, ACR <1, calcium 9.1, PTH 58

## 2019-05-30 DIAGNOSIS — Z008 Encounter for other general examination: Secondary | ICD-10-CM | POA: Diagnosis not present

## 2019-07-03 DIAGNOSIS — J302 Other seasonal allergic rhinitis: Secondary | ICD-10-CM | POA: Diagnosis not present

## 2019-07-03 DIAGNOSIS — H6980 Other specified disorders of Eustachian tube, unspecified ear: Secondary | ICD-10-CM | POA: Diagnosis not present

## 2019-07-14 ENCOUNTER — Telehealth: Payer: Self-pay | Admitting: Internal Medicine

## 2019-07-14 NOTE — Telephone Encounter (Signed)
Patient was calling in regards to her Prolia. Informed she will be getting a call to schedule when it is due.

## 2019-08-07 DIAGNOSIS — E782 Mixed hyperlipidemia: Secondary | ICD-10-CM | POA: Diagnosis not present

## 2019-08-07 DIAGNOSIS — J302 Other seasonal allergic rhinitis: Secondary | ICD-10-CM | POA: Diagnosis not present

## 2019-08-07 DIAGNOSIS — N183 Chronic kidney disease, stage 3 (moderate): Secondary | ICD-10-CM | POA: Diagnosis not present

## 2019-08-07 DIAGNOSIS — E1165 Type 2 diabetes mellitus with hyperglycemia: Secondary | ICD-10-CM | POA: Diagnosis not present

## 2019-08-07 DIAGNOSIS — M48061 Spinal stenosis, lumbar region without neurogenic claudication: Secondary | ICD-10-CM | POA: Diagnosis not present

## 2019-08-07 DIAGNOSIS — I890 Lymphedema, not elsewhere classified: Secondary | ICD-10-CM | POA: Diagnosis not present

## 2019-08-07 DIAGNOSIS — E559 Vitamin D deficiency, unspecified: Secondary | ICD-10-CM | POA: Diagnosis not present

## 2019-08-07 DIAGNOSIS — E1139 Type 2 diabetes mellitus with other diabetic ophthalmic complication: Secondary | ICD-10-CM | POA: Diagnosis not present

## 2019-08-07 DIAGNOSIS — M810 Age-related osteoporosis without current pathological fracture: Secondary | ICD-10-CM | POA: Diagnosis not present

## 2019-08-07 DIAGNOSIS — I10 Essential (primary) hypertension: Secondary | ICD-10-CM | POA: Diagnosis not present

## 2019-08-15 DIAGNOSIS — E1139 Type 2 diabetes mellitus with other diabetic ophthalmic complication: Secondary | ICD-10-CM | POA: Diagnosis not present

## 2019-08-15 DIAGNOSIS — M810 Age-related osteoporosis without current pathological fracture: Secondary | ICD-10-CM | POA: Diagnosis not present

## 2019-08-15 DIAGNOSIS — E782 Mixed hyperlipidemia: Secondary | ICD-10-CM | POA: Diagnosis not present

## 2019-08-15 DIAGNOSIS — E1165 Type 2 diabetes mellitus with hyperglycemia: Secondary | ICD-10-CM | POA: Diagnosis not present

## 2019-08-15 DIAGNOSIS — N183 Chronic kidney disease, stage 3 (moderate): Secondary | ICD-10-CM | POA: Diagnosis not present

## 2019-08-15 DIAGNOSIS — I10 Essential (primary) hypertension: Secondary | ICD-10-CM | POA: Diagnosis not present

## 2019-08-19 DIAGNOSIS — E782 Mixed hyperlipidemia: Secondary | ICD-10-CM | POA: Diagnosis not present

## 2019-08-19 DIAGNOSIS — N183 Chronic kidney disease, stage 3 (moderate): Secondary | ICD-10-CM | POA: Diagnosis not present

## 2019-08-19 DIAGNOSIS — E1165 Type 2 diabetes mellitus with hyperglycemia: Secondary | ICD-10-CM | POA: Diagnosis not present

## 2019-08-19 DIAGNOSIS — E1139 Type 2 diabetes mellitus with other diabetic ophthalmic complication: Secondary | ICD-10-CM | POA: Diagnosis not present

## 2019-08-19 DIAGNOSIS — Z79899 Other long term (current) drug therapy: Secondary | ICD-10-CM | POA: Diagnosis not present

## 2019-08-19 DIAGNOSIS — I10 Essential (primary) hypertension: Secondary | ICD-10-CM | POA: Diagnosis not present

## 2019-08-19 DIAGNOSIS — I890 Lymphedema, not elsewhere classified: Secondary | ICD-10-CM | POA: Diagnosis not present

## 2019-08-19 DIAGNOSIS — Z23 Encounter for immunization: Secondary | ICD-10-CM | POA: Diagnosis not present

## 2019-08-19 DIAGNOSIS — M48061 Spinal stenosis, lumbar region without neurogenic claudication: Secondary | ICD-10-CM | POA: Diagnosis not present

## 2019-08-19 DIAGNOSIS — M810 Age-related osteoporosis without current pathological fracture: Secondary | ICD-10-CM | POA: Diagnosis not present

## 2019-08-22 ENCOUNTER — Encounter: Payer: Self-pay | Admitting: Internal Medicine

## 2019-08-22 NOTE — Progress Notes (Signed)
Received labs from PCP, drawn on 08/19/2019: HbA1c 6.7% CMP normal with the exception of: Glucose 100, BUN/creatinine 22/1.59, GFR 38 ACR <11.9 Vitamin B12 678 Vitamin D 45 Lipids: 149/152/50/69

## 2019-09-10 ENCOUNTER — Other Ambulatory Visit: Payer: Self-pay

## 2019-09-10 ENCOUNTER — Ambulatory Visit (INDEPENDENT_AMBULATORY_CARE_PROVIDER_SITE_OTHER): Payer: Medicare HMO

## 2019-09-10 DIAGNOSIS — M810 Age-related osteoporosis without current pathological fracture: Secondary | ICD-10-CM | POA: Diagnosis not present

## 2019-09-10 MED ORDER — DENOSUMAB 60 MG/ML ~~LOC~~ SOSY
60.0000 mg | PREFILLED_SYRINGE | Freq: Once | SUBCUTANEOUS | Status: AC
Start: 2019-09-10 — End: 2019-09-10
  Administered 2019-09-10: 60 mg via SUBCUTANEOUS

## 2019-09-10 NOTE — Progress Notes (Signed)
Per orders of Dr. Gherghe injection of Prolia given today by Melissa, Certified Medical Assistant . Patient tolerated injection well.  

## 2019-09-16 DIAGNOSIS — Z9849 Cataract extraction status, unspecified eye: Secondary | ICD-10-CM | POA: Diagnosis not present

## 2019-09-16 DIAGNOSIS — E119 Type 2 diabetes mellitus without complications: Secondary | ICD-10-CM | POA: Diagnosis not present

## 2019-09-16 DIAGNOSIS — Z961 Presence of intraocular lens: Secondary | ICD-10-CM | POA: Diagnosis not present

## 2019-09-16 DIAGNOSIS — H5213 Myopia, bilateral: Secondary | ICD-10-CM | POA: Diagnosis not present

## 2019-09-16 DIAGNOSIS — H52223 Regular astigmatism, bilateral: Secondary | ICD-10-CM | POA: Diagnosis not present

## 2019-09-16 DIAGNOSIS — H524 Presbyopia: Secondary | ICD-10-CM | POA: Diagnosis not present

## 2019-09-26 DIAGNOSIS — E782 Mixed hyperlipidemia: Secondary | ICD-10-CM | POA: Diagnosis not present

## 2019-09-26 DIAGNOSIS — M810 Age-related osteoporosis without current pathological fracture: Secondary | ICD-10-CM | POA: Diagnosis not present

## 2019-09-26 DIAGNOSIS — E1165 Type 2 diabetes mellitus with hyperglycemia: Secondary | ICD-10-CM | POA: Diagnosis not present

## 2019-09-26 DIAGNOSIS — I10 Essential (primary) hypertension: Secondary | ICD-10-CM | POA: Diagnosis not present

## 2019-09-26 DIAGNOSIS — E1139 Type 2 diabetes mellitus with other diabetic ophthalmic complication: Secondary | ICD-10-CM | POA: Diagnosis not present

## 2019-10-09 DIAGNOSIS — K219 Gastro-esophageal reflux disease without esophagitis: Secondary | ICD-10-CM | POA: Diagnosis not present

## 2019-10-09 DIAGNOSIS — E782 Mixed hyperlipidemia: Secondary | ICD-10-CM | POA: Diagnosis not present

## 2019-10-09 DIAGNOSIS — E559 Vitamin D deficiency, unspecified: Secondary | ICD-10-CM | POA: Diagnosis not present

## 2019-10-09 DIAGNOSIS — I10 Essential (primary) hypertension: Secondary | ICD-10-CM | POA: Diagnosis not present

## 2019-10-09 DIAGNOSIS — I890 Lymphedema, not elsewhere classified: Secondary | ICD-10-CM | POA: Diagnosis not present

## 2019-10-09 DIAGNOSIS — N183 Chronic kidney disease, stage 3 unspecified: Secondary | ICD-10-CM | POA: Diagnosis not present

## 2019-10-09 DIAGNOSIS — E1139 Type 2 diabetes mellitus with other diabetic ophthalmic complication: Secondary | ICD-10-CM | POA: Diagnosis not present

## 2019-10-09 DIAGNOSIS — Z0000 Encounter for general adult medical examination without abnormal findings: Secondary | ICD-10-CM | POA: Diagnosis not present

## 2019-10-09 DIAGNOSIS — I700 Atherosclerosis of aorta: Secondary | ICD-10-CM | POA: Diagnosis not present

## 2019-10-14 DIAGNOSIS — M9903 Segmental and somatic dysfunction of lumbar region: Secondary | ICD-10-CM | POA: Diagnosis not present

## 2019-11-06 DIAGNOSIS — Z96653 Presence of artificial knee joint, bilateral: Secondary | ICD-10-CM | POA: Diagnosis not present

## 2019-11-06 DIAGNOSIS — Z471 Aftercare following joint replacement surgery: Secondary | ICD-10-CM | POA: Diagnosis not present

## 2019-12-04 DIAGNOSIS — Z7984 Long term (current) use of oral hypoglycemic drugs: Secondary | ICD-10-CM | POA: Diagnosis not present

## 2019-12-04 DIAGNOSIS — E1165 Type 2 diabetes mellitus with hyperglycemia: Secondary | ICD-10-CM | POA: Diagnosis not present

## 2019-12-04 DIAGNOSIS — M810 Age-related osteoporosis without current pathological fracture: Secondary | ICD-10-CM | POA: Diagnosis not present

## 2019-12-04 DIAGNOSIS — E782 Mixed hyperlipidemia: Secondary | ICD-10-CM | POA: Diagnosis not present

## 2019-12-04 DIAGNOSIS — E1139 Type 2 diabetes mellitus with other diabetic ophthalmic complication: Secondary | ICD-10-CM | POA: Diagnosis not present

## 2019-12-04 DIAGNOSIS — I10 Essential (primary) hypertension: Secondary | ICD-10-CM | POA: Diagnosis not present

## 2020-03-26 ENCOUNTER — Other Ambulatory Visit: Payer: Self-pay | Admitting: Physician Assistant

## 2020-03-26 DIAGNOSIS — R109 Unspecified abdominal pain: Secondary | ICD-10-CM

## 2020-03-29 ENCOUNTER — Other Ambulatory Visit: Payer: Self-pay | Admitting: Physician Assistant

## 2020-03-29 ENCOUNTER — Telehealth: Payer: Self-pay

## 2020-03-29 DIAGNOSIS — R109 Unspecified abdominal pain: Secondary | ICD-10-CM

## 2020-03-29 DIAGNOSIS — R1032 Left lower quadrant pain: Secondary | ICD-10-CM

## 2020-03-29 DIAGNOSIS — R1012 Left upper quadrant pain: Secondary | ICD-10-CM

## 2020-03-29 NOTE — Telephone Encounter (Signed)
Called [employer] and initiated PA for Prolia 60mg .   PA was approved from 03/29/2020 through 03/29/2021.  Spoke with representative name Vim S. And was then transferred to [place], [place].D. for approval.  Call reference number is [id number] Called pt and informed her of this and scheduled her for 04/07/20 for Prolia injection.

## 2020-04-07 ENCOUNTER — Ambulatory Visit (INDEPENDENT_AMBULATORY_CARE_PROVIDER_SITE_OTHER): Payer: Medicare HMO

## 2020-04-07 ENCOUNTER — Other Ambulatory Visit: Payer: Self-pay

## 2020-04-07 DIAGNOSIS — M810 Age-related osteoporosis without current pathological fracture: Secondary | ICD-10-CM

## 2020-04-07 MED ORDER — DENOSUMAB 60 MG/ML ~~LOC~~ SOSY
60.0000 mg | PREFILLED_SYRINGE | Freq: Once | SUBCUTANEOUS | Status: AC
Start: 2020-04-07 — End: 2020-04-07
  Administered 2020-04-07: 60 mg via SUBCUTANEOUS

## 2020-04-07 NOTE — Progress Notes (Signed)
Per orders of Dr. Gherghe injection of Prolia given today by Corney Knighton, Certified Medical Assistant . Patient tolerated injection well.  

## 2020-04-13 ENCOUNTER — Other Ambulatory Visit: Payer: Medicare HMO

## 2020-04-14 ENCOUNTER — Other Ambulatory Visit: Payer: Self-pay | Admitting: Physician Assistant

## 2020-04-16 ENCOUNTER — Ambulatory Visit
Admission: RE | Admit: 2020-04-16 | Discharge: 2020-04-16 | Disposition: A | Discharge status: Home / Self Care | Payer: Medicare HMO | Source: Ambulatory Visit | Attending: Physician Assistant | Admitting: Physician Assistant

## 2020-04-16 ENCOUNTER — Other Ambulatory Visit: Payer: Self-pay

## 2020-04-16 DIAGNOSIS — R109 Unspecified abdominal pain: Secondary | ICD-10-CM

## 2020-04-16 DIAGNOSIS — R1032 Left lower quadrant pain: Secondary | ICD-10-CM

## 2020-04-16 DIAGNOSIS — R1012 Left upper quadrant pain: Secondary | ICD-10-CM

## 2020-06-14 NOTE — Progress Notes (Signed)
Cardiology Office Note:    Date:  06/16/2020   ID:  Holly Fields, DOB 04-May-1941, MRN [id number]  PCP:  [name], MD  Cardiologist:  Lesleigh Noe, MD   Referring MD: [name], MD   Chief Complaint  Patient presents with  . Advice Only    PSVT    History of Present Illness:    Holly Fields is a 79 y.o. female with a hx of history ofessential HTN, hyperlipidemia, Lymphedema, DMtype II, CKD stage III,and prior history ofPSVTtreated with IV adenacard..  We had a long discussion today.  She continues to have occasional brief palpitations but otherwise no significant complaints.  She denies exertional chest discomfort.  There is no orthopnea or PND.  She has not had syncope or anginal quality symptoms.  There have been no transient neurological complaints.  She denies swelling in her ankles.  She is relatively sedentary related to bilateral knee arthritis.  Past Medical History:  Diagnosis Date  . Arthritis   . CKD (chronic kidney disease), stage III     saw dr Lowell Guitar 2 years ago, now released from nephrology  . Complication of anesthesia 2010   "sluggish after anesthesia and had vertigo", slow to awaken after knee artroscopy 2012  . Diabetes (HCC)   . Eczema    at times  . GERD (gastroesophageal reflux disease)   . HTN (hypertension)   . Hypercholesteremia   . Lymphedema of leg    right more than RIGHT  . Narrowing of airway 2010  . Numbness and tingling of right leg   . Osteopenia   . PONV (postoperative nausea and vomiting)    pt has n/v and vertigo after anesthesia  . PSVT (paroxysmal supraventricular tachycardia) (HCC)   . Sinus tachycardia   . Vertigo    at times, cannot turn on left side quickly or sleep on left side  . Vitamin D deficiency     Past Surgical History:  Procedure Laterality Date  . arthroscopic knee surgery Left 2012  . CARDIAC CATHETERIZATION  2012  . JOINT REPLACEMENT Right 2010  . LUMBAR  LAMINECTOMY/DECOMPRESSION MICRODISCECTOMY N/A 10/22/2013   Procedure: MICROLUMBAR DECOMPRESSION LUMBAR TWO TO THREE, LUMBAR THREE TO FOUR and faramenotomy two,threeand four ,five bilateral;  Surgeon: Javier Docker, MD;  Location: [facility];  Service: Orthopedics;  Laterality: N/A;  . NASAL SINUS SURGERY  yrs ago    x 2  . TOTAL KNEE ARTHROPLASTY Left 03/01/2015   Procedure: LEFT TOTAL KNEE ARTHROPLASTY;  Surgeon: Ollen Gross, MD;  Location: [facility];  Service: Orthopedics;  Laterality: Left;    Current Medications: Current Meds  Medication Sig  . aspirin 81 MG tablet Take 81 mg by mouth daily.  [name] Kitchen atorvastatin (LIPITOR) 10 MG tablet Take 10 mg by mouth every morning.   . Cholecalciferol (VITAMIN D3) 1000 units CAPS Take 1 capsule by mouth 3 (three) times a week.   . denosumab (PROLIA) 60 MG/ML SOLN injection Inject 60 mg into the skin every 6 (six) months. Administer in upper arm, thigh, or abdomen  . fexofenadine (ALLEGRA) 180 MG tablet Take 90 mg daily as needed by mouth for allergies. Takes 1/2 tablet   . fluticasone (FLONASE) 50 MCG/ACT nasal spray Place 2 sprays into the nose as needed for allergies.   . hydrochlorothiazide (HYDRODIURIL) 25 MG tablet Take 1 tablet (25 mg total) by mouth daily.  [name] Kitchen JANUVIA 50 MG tablet Take 50 mg by mouth every morning.   . lansoprazole (  PREVACID) 15 MG capsule Take 15 mg by mouth daily at 12 noon.  . metoprolol succinate (TOPROL-XL) 25 MG 24 hr tablet Take 1 tablet (25 mg total) by mouth daily. Take with or immediately following a meal.  . Misc Natural Products (NEURIVA PO) Take 1 capsule by mouth daily.  [name] Kitchen triamcinolone cream (KENALOG) 0.1 % APPLY TO AFFECTED AREA UP TO TWICE A DAY AS NEEDED (NOT TO FACE,GROIN,UNDER ARMS)     Allergies:   Adenosine, Augmentin [amoxicillin-pot clavulanate], Codeine, Doxycycline, Hydromorphone hcl, Metformin and related, Oxycodone, and Tramadol   Social History   Socioeconomic History  . Marital status: Married     Spouse name: Not on file  . Number of children: Not on file  . Years of education: Not on file  . Highest education level: Not on file  Occupational History  . Not on file  Tobacco Use  . Smoking status: Former Smoker    Packs/day: 0.25    Years: 15.00    Pack years: 3.75    Types: Cigarettes    Quit date: 12/04/1976    Years since quitting: 43.5  . Smokeless tobacco: Never Used  Substance and Sexual Activity  . Alcohol use: Yes    Comment: very occasional wine  . Drug use: No  . Sexual activity: Not on file  Other Topics Concern  . Not on file  Social History Narrative  . Not on file   Social Determinants of Health   Financial Resource Strain:   . Difficulty of Paying Living Expenses:   Food Insecurity:   . Worried About Programme researcher, broadcasting/film/video in the Last Year:   . Barista in the Last Year:   Transportation Needs:   . Freight forwarder (Medical):   [name] Kitchen Lack of Transportation (Non-Medical):   Physical Activity:   . Days of Exercise per Week:   . Minutes of Exercise per Session:   Stress:   . Feeling of Stress :   Social Connections:   . Frequency of Communication with Friends and Family:   . Frequency of Social Gatherings with Friends and Family:   . Attends Religious Services:   . Active Member of Clubs or Organizations:   . Attends Banker Meetings:   [name] Kitchen Marital Status:      Family History: The patient's family history includes Arrhythmia in her father and mother; Heart attack in her father; Heart failure in her father; Hyperlipidemia in her mother; Hypertension in her mother.  ROS:   Please see the history of present illness.    Significant stress during the pandemic.  Also has been stressed by her husband's diagnosis of Parkinson's and requiring physical help.  Neuropathy is keeping her from sleeping restfully.  She has chronic lymphedema which can also be painful.  Knee discomfort prevents physical activity.  She has neuropathic pain.  And  feels that her exertional tolerance has decreased during the pandemic.  All other systems reviewed and are negative.  EKGs/Labs/Other Studies Reviewed:    The following studies were reviewed today:  2D-ECHOCARDIOGRAM 2018: Study Conclusions   - Left ventricle: The cavity size was normal. Wall thickness was  normal. Systolic function was vigorous. The estimated ejection  fraction was in the range of 65% to 70%. Wall motion was normal;  there were no regional wall motion abnormalities. Doppler  parameters are consistent with abnormal left ventricular  relaxation (grade 1 diastolic dysfunction). The E/e&' ratio is  between 8-15, suggesting indeterminate LV  filling pressure.  - Aortic valve: Trileaflet. Sclerosis without stenosis. There was  no regurgitation.  - Left atrium: The atrium was normal in size.  - Tricuspid valve: There was trivial regurgitation.  - Pulmonary arteries: PA peak pressure: 19 mm Hg (S).  - Inferior vena cava: The vessel was normal in size. The  respirophasic diameter changes were in the normal range (>= 50%),  consistent with normal central venous pressure.   Impressions:   - LVEF 65-70%, normal wall thickness, normal wall motion, grade 1  DD, indeterminate LV filling, normal LA size, trivial TR, RVSP 19  mmHg, normal IVC.   EKG:  EKG June 16, 2020 demonstrating normal sinus rhythm with nonspecific T wave abnormality.  Recent Labs: No results found for requested labs within last 8760 hours.  Recent Lipid Panel    Component Value Date/Time   CHOL 149 09/20/2018 0000   TRIG 146 09/20/2018 0000   HDL 51 09/20/2018 0000   LDLCALC 68 09/20/2018 0000    Physical Exam:    VS:  BP 138/82   Pulse 93   Ht 5\' 2"  (1.575 m)   Wt 221 lb 3.2 oz (100.3 kg)   SpO2 95%   BMI 40.46 kg/m     Wt Readings from Last 3 Encounters:  06/16/20 221 lb 3.2 oz (100.3 kg)  05/01/19 208 lb (94.3 kg)  05/08/18 211 lb 9.6 oz (96 kg)     GEN:  Moderate obesity. No acute distress HEENT: Normal NECK: No JVD. LYMPHATICS: No lymphadenopathy CARDIAC:  RRR without murmur, gallop.  Severe bilateral ankle to upper shin edema that has been treated as the lymph edema. VASCULAR:  Normal Pulses. No bruits. RESPIRATORY:  Clear to auscultation without rales, wheezing or rhonchi  ABDOMEN: Soft, non-tender, non-distended, No pulsatile mass, MUSCULOSKELETAL: No deformity  SKIN: Warm and dry NEUROLOGIC:  Alert and oriented x 3 PSYCHIATRIC:  Normal affect   ASSESSMENT:    1. PSVT (paroxysmal supraventricular tachycardia) (HCC)   2. Hyperlipidemia LDL goal <70   3. Essential hypertension   4. Stage 3b chronic kidney disease   5. Educated about COVID-19 virus infection    PLAN:    In order of problems listed above:  1. Controlled on relatively low-dose beta-blocker therapy.  Continue metoprolol.  She takes 25 mg of Toprol-XL daily.   2. LDL cholesterol less than a year ago revealed a value of 69.  Target is 70 with her history of diabetes.  Continue atorvastatin 10 mg/day. 3. Continue HydroDIURIL, Toprol-XL, with target blood pressure 130/80 mmHg.  2 g sodium diet is recommended.  Avoidance of nonsteroidals is recommended. 4. Stage III chronic kidney disease is present.  Continue close observation and consider adding dapagliflozin as this may protect kidney function in diabetics. 5. Continue social distancing.  She is congratulated on being vaccinated.   Medication Adjustments/Labs and Tests Ordered: Current medicines are reviewed at length with the patient today.  Concerns regarding medicines are outlined above.  Orders Placed This Encounter  Procedures  . EKG 12-Lead   No orders of the defined types were placed in this encounter.   Patient Instructions  Medication Instructions:  Your physician recommends that you continue on your current medications as directed. Please refer to the Current Medication list given to you today.  *If  you need a refill on your cardiac medications before your next appointment, please call your pharmacy*   Lab Work: None If you have labs (blood work) drawn today and your tests  are completely normal, you will receive your results only by: [name] Kitchen MyChart Message (if you have MyChart) OR . A paper copy in the mail If you have any lab test that is abnormal or we need to change your treatment, we will call you to review the results.   Testing/Procedures: None   Follow-Up: At [facility], you and your health needs are our priority.  As part of our continuing mission to provide you with exceptional heart care, we have created designated Provider Care Teams.  These Care Teams include your primary Cardiologist (physician) and Advanced Practice Providers (APPs -  Physician Assistants and Nurse Practitioners) who all work together to provide you with the care you need, when you need it.  We recommend signing up for the patient portal called "MyChart".  Sign up information is provided on this After Visit Summary.  MyChart is used to connect with patients for Virtual Visits (Telemedicine).  Patients are able to view lab/test results, encounter notes, upcoming appointments, etc.  Non-urgent messages can be sent to your provider as well.   To learn more about what you can do with MyChart, go to [email or web address].    Your next appointment:   12 month(s)  The format for your next appointment:   In Person  Provider:   You may see Lesleigh Noe, MD or one of the following Advanced Practice Providers on your designated Care Team:    Norma Fredrickson, NP  Nada Boozer, NP  Georgie Chard, NP    Other Instructions      Signed, Lesleigh Noe, MD  06/16/2020 12:09 PM    [facility]

## 2020-06-16 ENCOUNTER — Ambulatory Visit (INDEPENDENT_AMBULATORY_CARE_PROVIDER_SITE_OTHER): Payer: Medicare HMO | Admitting: Interventional Cardiology

## 2020-06-16 ENCOUNTER — Encounter: Payer: Self-pay | Admitting: Interventional Cardiology

## 2020-06-16 ENCOUNTER — Other Ambulatory Visit: Payer: Self-pay

## 2020-06-16 VITALS — BP 138/82 | HR 93 | Ht 62.0 in | Wt 221.2 lb

## 2020-06-16 DIAGNOSIS — I471 Supraventricular tachycardia: Secondary | ICD-10-CM | POA: Diagnosis not present

## 2020-06-16 DIAGNOSIS — E785 Hyperlipidemia, unspecified: Secondary | ICD-10-CM | POA: Diagnosis not present

## 2020-06-16 DIAGNOSIS — I10 Essential (primary) hypertension: Secondary | ICD-10-CM | POA: Diagnosis not present

## 2020-06-16 DIAGNOSIS — N1832 Chronic kidney disease, stage 3b: Secondary | ICD-10-CM

## 2020-06-16 DIAGNOSIS — Z7189 Other specified counseling: Secondary | ICD-10-CM

## 2020-06-16 NOTE — Patient Instructions (Signed)

## 2020-07-03 ENCOUNTER — Other Ambulatory Visit: Payer: Self-pay

## 2020-07-03 ENCOUNTER — Emergency Department (HOSPITAL_COMMUNITY)
Admission: EM | Admit: 2020-07-03 | Discharge: 2020-07-03 | Disposition: A | Discharge status: Home / Self Care | Payer: Medicare HMO | Attending: Emergency Medicine | Admitting: Emergency Medicine

## 2020-07-03 DIAGNOSIS — M7989 Other specified soft tissue disorders: Secondary | ICD-10-CM | POA: Diagnosis present

## 2020-07-03 DIAGNOSIS — R0602 Shortness of breath: Secondary | ICD-10-CM | POA: Diagnosis not present

## 2020-07-03 DIAGNOSIS — Z5321 Procedure and treatment not carried out due to patient leaving prior to being seen by health care provider: Secondary | ICD-10-CM | POA: Diagnosis not present

## 2020-07-03 NOTE — ED Notes (Signed)
Pt. Was called X 3 inside the lobby and the outside.  No response from the pt. Nurses aware.  °

## 2020-07-03 NOTE — ED Triage Notes (Signed)
Patient reports swelling in bilateral extremities. Patient says she was diagnosed with lymphedema. Patient states she has had some shortness of breath over the past couple of days.

## 2020-08-19 ENCOUNTER — Telehealth: Payer: Self-pay

## 2020-08-19 NOTE — Telephone Encounter (Signed)
Patient has been submitted to [employer] Assist Portal for verification of benefits for Prolia. Patient is not due until 10/09/20 for next injection. Portal will automatically submit patient for verification on 09/08/20.

## 2020-09-02 ENCOUNTER — Encounter: Payer: Self-pay | Admitting: *Deleted

## 2020-09-03 ENCOUNTER — Encounter: Payer: Self-pay | Admitting: Neurology

## 2020-09-03 ENCOUNTER — Ambulatory Visit (INDEPENDENT_AMBULATORY_CARE_PROVIDER_SITE_OTHER): Payer: Medicare HMO | Admitting: Neurology

## 2020-09-03 ENCOUNTER — Other Ambulatory Visit: Payer: Self-pay

## 2020-09-03 VITALS — BP 141/78 | HR 82 | Ht 62.0 in | Wt 219.2 lb

## 2020-09-03 DIAGNOSIS — M79641 Pain in right hand: Secondary | ICD-10-CM

## 2020-09-03 DIAGNOSIS — G629 Polyneuropathy, unspecified: Secondary | ICD-10-CM

## 2020-09-03 DIAGNOSIS — M79672 Pain in left foot: Secondary | ICD-10-CM

## 2020-09-03 DIAGNOSIS — M79671 Pain in right foot: Secondary | ICD-10-CM | POA: Diagnosis not present

## 2020-09-03 DIAGNOSIS — M79642 Pain in left hand: Secondary | ICD-10-CM

## 2020-09-03 NOTE — Progress Notes (Signed)
[id number]    Provider:  Dr Lucia Gaskins Requesting Provider: [name], MD Primary Care Provider:  [name], MD  CC:  Nerve pain  HPI:  Holly Fields is a 79 y.o. female here as requested by [name], MD for neuropathy. PMHx DM2, lumbar spinal stenosis, neuropathy, lymphedema in the legs, CKD.   She is here with ehr husband. She has pain in her feet due to neuropathy, arthritis, lymphedema, spinal stenosis, she has been "everywhere" and had lymphedema therapy, pain management, she is here with ehr husband who also provides information. She has burning, stinging keeping her awake at night, she has had 2 knee replacements and it "settles" in my knees but the pain and stiffness occurs in her knees and makes her imbalanced. She has imbalance due to her knees, her lymphedema. She has been to her knee doctor. She has her regular check ups and she sees Dr. Katrinka Blazing. She has started Nerve Renew and she is feeling so much better. She used to wake up 230am with burning and aching, she would get up, nothing was comfortable and sleep deprived. Where she is now is the Lymphedema goes in cycles. She has excruciating pain in her legs with the lymphedema, putting socks on helps, during the day she does well in terms of burning pain. Ongoing for years and progressive. She will take a 650mg  Tylenol and that helps. She feels she has a very high pain tolerance. She has had daily pain for many years. She wakes with numb hands, worse at night. No other focal neurologic deficits, associated symptoms, inciting events or modifiable factors.  Meds tried: tramadol, hydrocodone, Gabapentin (reaction),   Reviewed notes, labs and imaging from outside physicians, which showed:  Labs 03/31/2020: cbc unremarkable, cmp BUN 23 creat 1.60, hgba1c 6.7  Reviewed MRI lumbar spine images and agree with the following: IMPRESSION: 1. Postoperative changes at L2-3 and L3-4 with wide  decompressive laminectomies. 2. Moderate-sized disc extrusion at L3-4 as discussed above. There is mild mass effect on the ventral thecal sac and bilateral lateral recess encroachment. Mild bilateral foraminal encroachment also. 3. Multifactorial mild spinal and moderate bilateral lateral recess stenosis at L4-5. There is also bilateral foraminal stenosis.  Review of Systems: Patient complains of symptoms per HPI as well as the following symptoms foot pain . Pertinent negatives and positives per HPI. All others negative.   Social History   Socioeconomic History  . Marital status: Married    Spouse name: Not on file  . Number of children: 1  . Years of education: Not on file  . Highest education level: Not on file  Occupational History    Comment: retired  Tobacco Use  . Smoking status: Former Smoker    Packs/day: 0.25    Years: 15.00    Pack years: 3.75    Types: Cigarettes    Quit date: 12/04/1976    Years since quitting: 43.7  . Smokeless tobacco: Never Used  Substance and Sexual Activity  . Alcohol use: Yes    Comment: very occasional wine  . Drug use: No  . Sexual activity: Not on file  Other Topics Concern  . Not on file  Social History Narrative   Caffeine- 1 drink 3 times per week (usually drinks decaf)   Left handed    Social Determinants of Health   Financial Resource Strain:   . Difficulty of Paying Living Expenses: Not on file  Food Insecurity:   . Worried About 02/01/1977 of  Food in the Last Year: Not on file  . Ran Out of Food in the Last Year: Not on file  Transportation Needs:   . Lack of Transportation (Medical): Not on file  . Lack of Transportation (Non-Medical): Not on file  Physical Activity:   . Days of Exercise per Week: Not on file  . Minutes of Exercise per Session: Not on file  Stress:   . Feeling of Stress : Not on file  Social Connections:   . Frequency of Communication with Friends and Family: Not on file  . Frequency of Social  Gatherings with Friends and Family: Not on file  . Attends Religious Services: Not on file  . Active Member of Clubs or Organizations: Not on file  . Attends BankerClub or Organization Meetings: Not on file  . Marital Status: Not on file  Intimate Partner Violence:   . Fear of Current or Ex-Partner: Not on file  . Emotionally Abused: Not on file  . Physically Abused: Not on file  . Sexually Abused: Not on file    Family History  Problem Relation Age of Onset  . Arrhythmia Mother   . Hyperlipidemia Mother   . Hypertension Mother   . Arrhythmia Father   . Heart attack Father   . Heart failure Father     Past Medical History:  Diagnosis Date  . Arthritis   . CKD (chronic kidney disease), stage III [facility](HCC)     saw dr Lowell Guitarpowell 2 years ago, now released from nephrology  . Complication of anesthesia 2010   "sluggish after anesthesia and had vertigo", slow to awaken after knee artroscopy 2012  . Diabetes (HCC)   . Eczema    at times  . GERD (gastroesophageal reflux disease)   . HTN (hypertension)   . Hypercholesteremia   . Lymphedema of leg    right more than RIGHT  . Narrowing of airway 2010  . Neuropathy   . Numbness and tingling of right leg   . Osteoarthritis   . Osteopenia   . Osteoporosis   . PONV (postoperative nausea and vomiting)    pt has n/v and vertigo after anesthesia  . PSVT (paroxysmal supraventricular tachycardia) (HCC)   . Sinus tachycardia   . Vertigo    at times, cannot turn on left side quickly or sleep on left side  . Vitamin D deficiency     Patient Active Problem List   Diagnosis Date Noted  . Lymphedema 11/06/2017  . PSVT (paroxysmal supraventricular tachycardia) (HCC)   . Sinus tachycardia   . HTN (hypertension)   . Osteoporosis without current pathological fracture 04/13/2017  . OA (osteoarthritis) of knee 03/01/2015  . Preoperative cardiovascular examination 01/05/2015  . Spinal stenosis of lumbar region 10/22/2013    Past Surgical History:   Procedure Laterality Date  . arthroscopic knee surgery Left 2012  . CARDIAC CATHETERIZATION  2012  . JOINT REPLACEMENT Right 2010  . LUMBAR LAMINECTOMY/DECOMPRESSION MICRODISCECTOMY N/A 10/22/2013   Procedure: MICROLUMBAR DECOMPRESSION LUMBAR TWO TO THREE, LUMBAR THREE TO FOUR and faramenotomy two,threeand four ,five bilateral;  Surgeon: Javier DockerJeffrey C Beane, MD;  Location: [facility];  Service: Orthopedics;  Laterality: N/A;  . NASAL SINUS SURGERY  yrs ago    x 2  . TOTAL KNEE ARTHROPLASTY Left 03/01/2015   Procedure: LEFT TOTAL KNEE ARTHROPLASTY;  Surgeon: Ollen GrossFrank Aluisio, MD;  Location: [facility];  Service: Orthopedics;  Laterality: Left;    Current Outpatient Medications  Medication Sig Dispense Refill  . aspirin 81  MG tablet Take 81 mg by mouth daily.    [name] Kitchen atorvastatin (LIPITOR) 10 MG tablet Take 10 mg by mouth every morning.     . Cholecalciferol (VITAMIN D3) 1000 units CAPS Take 1 capsule by mouth 3 (three) times a week.     . denosumab (PROLIA) 60 MG/ML SOLN injection Inject 60 mg into the skin every 6 (six) months. Administer in upper arm, thigh, or abdomen    . fexofenadine (ALLEGRA) 180 MG tablet Take 90 mg daily as needed by mouth for allergies. Takes 1/2 tablet     . fluticasone (FLONASE) 50 MCG/ACT nasal spray Place 2 sprays into the nose as needed for allergies.     . hydrochlorothiazide (HYDRODIURIL) 25 MG tablet Take 1 tablet (25 mg total) by mouth daily. 90 tablet 3  . JANUVIA 50 MG tablet Take 50 mg by mouth every morning.     . lansoprazole (PREVACID) 15 MG capsule Take 15 mg by mouth daily at 12 noon.    . metoprolol succinate (TOPROL-XL) 25 MG 24 hr tablet Take 1 tablet (25 mg total) by mouth daily. Take with or immediately following a meal. 90 tablet 3  . Misc Natural Products (NEURIVA PO) Take 1 capsule by mouth daily.    [name] Kitchen triamcinolone cream (KENALOG) 0.1 % APPLY TO AFFECTED AREA UP TO TWICE A DAY AS NEEDED (NOT TO FACE,GROIN,UNDER ARMS)  3   No current facility-administered  medications for this visit.    Allergies as of 09/03/2020 - Review Complete 09/03/2020  Allergen Reaction Noted  . Adenosine  09/18/2013  . Augmentin [amoxicillin-pot clavulanate] Hives 09/18/2013  . Codeine Nausea And Vomiting 09/18/2013  . Doxycycline Hives 09/18/2013  . Fosamax [alendronate] Swelling 09/02/2020  . Hydrocodone-acetaminophen  09/02/2020  . Hydromorphone hcl Nausea And Vomiting 10/03/2017  . Metformin and related  09/18/2013  . Neurontin [gabapentin]  09/02/2020  . Oxycodone Nausea And Vomiting 10/14/2013  . Sulfa antibiotics  07/03/2020  . Tramadol Nausea And Vomiting 09/18/2013    Vitals: BP (!) 141/78 (BP Location: Right Arm, Patient Position: Sitting, Cuff Size: Normal)   Pulse 82   Ht 5\' 2"  (1.575 m)   Wt 219 lb 3.2 oz (99.4 kg)   BMI 40.09 kg/m  Last Weight:  Wt Readings from Last 1 Encounters:  09/03/20 219 lb 3.2 oz (99.4 kg)   Last Height:   Ht Readings from Last 1 Encounters:  09/03/20 5\' 2"  (1.575 m)      Physical exam: Exam: Gen: NAD, conversant, well nourised, obese, well groomed                     CV: RRR, no MRG. No Carotid Bruits. + distal edema, warm, nontender Eyes: Conjunctivae clear without exudates or hemorrhage  Neuro: Detailed Neurologic Exam  Speech:    Speech is normal; fluent and spontaneous with normal comprehension.  Cognition:    The patient is oriented to person, place, and time;     recent and remote memory intact;     language fluent;     normal attention, concentration,     fund of knowledge Cranial Nerves:    The pupils are equal, round, and reactive to light. The fundi are normal and spontaneous venous pulsations are present.pupils too small to visualize pin prick.  Visual fields are full to finger confrontation. Extraocular movements are intact. Trigeminal sensation is intact and the muscles of mastication are normal. The face is symmetric. The palate elevates in the midline. Hearing  intact. Voice is normal.  Shoulder shrug is normal. The tongue midline  Coordination:    No dysmetria or ataxia   Gait:    Wide based with a cane  Motor Observation:    No asymmetry, no atrophy, and no involuntary movements noted. Tone:    Normal muscle tone.    Posture:    Posture is normal. normal erect    Strength: dififculty due to pain but no focal weakness noted, antigravity in all limbs.        Sensation: intact to LT, pin prick     Reflex Exam:  DTR's: Absent Lowers,  Absent lowers, +2 biceps.    Toes:    The toes are equic bilaterally.   Clonus:    Clonus is absent.    Assessment/Plan:  Patient with pain in her feet and imbalance. Likely multifactorial including neuropathy, arthritis, lymphedema, hx of lumbar degenerative disease and spinal stenosis. We discussed a great deal and she has good insight into all the conditions that can be affecting her including her knee pain.  - Fortunately she is improved on B1, B12, B2, alpha lipoic acid and doing very well, these OTC medications/supplements do not go through FDA approval so make sure there are no megadoses(I reviewed seemed ok) - Can try a topical compounded medication with topical gabapentin may not affect her like the oral will order for her. She agrees to try. - We did discuss cymbalta, other medications but she does not want something every day. The other things would be topical such as lidocaine/capsaicin but she says she has tried eveything, pain management can compound topicals as well. She can continue Tylenol. Tramadol.  - In the hands could be CTS, she declined emg/ncs, discussed conservative measures    Cc: [name], MD,  [name], MD  Naomie Dean, MD  [facility] [address] [place], [place] [address]  Phone [phone] Fax [phone]  I spent 60  minutes of face-to-face and non-face-to-face time with patient on the  1. Pain in both feet   2. Neuropathy   3.  Pain in both hands    diagnosis.  This included previsit chart review, lab review, study review, order entry, electronic health record documentation, patient education on the different diagnostic and therapeutic options, counseling and coordination of care, risks and benefits of management, compliance, or risk factor reduction

## 2020-09-03 NOTE — Patient Instructions (Signed)
Buy carpal Tunnel Splint and wear at night We will call pharmacy and have a compounded cream    Carpal Tunnel Syndrome  Carpal tunnel syndrome is a condition that causes pain in your hand and arm. The carpal tunnel is a narrow area located on the palm side of your wrist. Repeated wrist motion or certain diseases may cause swelling within the tunnel. This swelling pinches the main nerve in the wrist (median nerve). What are the causes? This condition may be caused by:  Repeated wrist motions.  Wrist injuries.  Arthritis.  A cyst or tumor in the carpal tunnel.  Fluid buildup during pregnancy. Sometimes the cause of this condition is not known. What increases the risk? The following factors may make you more likely to develop this condition:  Having a job, such as being a Haematologist, that requires you to repeatedly move your wrist in the same motion.  Being a woman.  Having certain conditions, such as: ? Diabetes. ? Obesity. ? An underactive thyroid (hypothyroidism). ? Kidney failure. What are the signs or symptoms? Symptoms of this condition include:  A tingling feeling in your fingers, especially in your thumb, index, and middle fingers.  Tingling or numbness in your hand.  An aching feeling in your entire arm, especially when your wrist and elbow are bent for a long time.  Wrist pain that goes up your arm to your shoulder.  Pain that goes down into your palm or fingers.  A weak feeling in your hands. You may have trouble grabbing and holding items. Your symptoms may feel worse during the night. How is this diagnosed? This condition is diagnosed with a medical history and physical exam. You may also have tests, including:  Electromyogram (EMG). This test measures electrical signals sent by your nerves into the muscles.  Nerve conduction study. This test measures how well electrical signals pass through your nerves.  Imaging tests, such as X-rays,  ultrasound, and MRI. These tests check for possible causes of your condition. How is this treated? This condition may be treated with:  Lifestyle changes. It is important to stop or change the activity that caused your condition.  Doing exercise and activities to strengthen your muscles and bones (physical therapy).  Learning how to use your hand again after diagnosis (occupational therapy).  Medicines for pain and inflammation. This may include medicine that is injected into your wrist.  A wrist splint.  Surgery. Follow these instructions at home: If you have a splint:  Wear the splint as told by your health care provider. Remove it only as told by your health care provider.  Loosen the splint if your fingers tingle, become numb, or turn cold and blue.  Keep the splint clean.  If the splint is not waterproof: ? Do not let it get wet. ? Cover it with a watertight covering when you take a bath or shower. Managing pain, stiffness, and swelling   If directed, put ice on the painful area: ? If you have a removable splint, remove it as told by your health care provider. ? Put ice in a plastic bag. ? Place a towel between your skin and the bag. ? Leave the ice on for 20 minutes, 2-3 times per day. General instructions  Take over-the-counter and prescription medicines only as told by your health care provider.  Rest your wrist from any activity that may be causing your pain. If your condition is work related, talk with your employer about changes  that can be made, such as getting a wrist pad to use while typing.  Do any exercises as told by your health care provider, physical therapist, or occupational therapist.  Keep all follow-up visits as told by your health care provider. This is important. Contact a health care provider if:  You have new symptoms.  Your pain is not controlled with medicines.  Your symptoms get worse. Get help right away if:  You have severe  numbness or tingling in your wrist or hand. Summary  Carpal tunnel syndrome is a condition that causes pain in your hand and arm.  It is usually caused by repeated wrist motions.  Lifestyle changes and medicines are used to treat carpal tunnel syndrome. Surgery may be recommended.  Follow your health care provider's instructions about wearing a splint, resting from activity, keeping follow-up visits, and calling for help. This information is not intended to replace advice given to you by your health care provider. Make sure you discuss any questions you have with your health care provider. Document Revised: 03/29/2018 Document Reviewed: 03/29/2018 Elsevier Patient Education  2020 [employer].

## 2020-09-06 ENCOUNTER — Encounter: Payer: Self-pay | Admitting: Neurology

## 2020-09-07 ENCOUNTER — Telehealth: Payer: Self-pay | Admitting: *Deleted

## 2020-09-07 NOTE — Telephone Encounter (Signed)
[facility] compound cream order form completed, signed by Dr Lucia Gaskins, and faxed to [facility] [facility].   Cream:  Amantadine 8%, Baclofen 2%, Gabapentin 8%, Amitriptyline 4%, Bupivacaine 2%, Clonidine 0.2%, Nifedipine 2%.  Apply 1-2 grams to the affected area 3-4 times daily.

## 2020-09-15 NOTE — Telephone Encounter (Signed)
Received a fax stating formula substituted for the patient (substitution allowed by Dr Lucia Gaskins). She will be getting Topiramate 2.5%, Celecoxib 2%, Gabapentin 6%, and Lidocaine 3%.   Medication contents changed in MAR.

## 2020-10-21 NOTE — Telephone Encounter (Signed)
Patient called wanting to schedule Prolia - please advise where we are in that verification process.

## 2020-10-23 NOTE — Telephone Encounter (Signed)
PA was approved from 03/29/2020 through 03/29/2021. ATC patient today for scheduling-I left her a VM requesting she call back to schedule nurse visit-she can have anytime-owes $0

## 2020-11-02 ENCOUNTER — Other Ambulatory Visit: Payer: Self-pay

## 2020-11-02 ENCOUNTER — Ambulatory Visit (INDEPENDENT_AMBULATORY_CARE_PROVIDER_SITE_OTHER): Payer: Medicare HMO | Admitting: Internal Medicine

## 2020-11-02 ENCOUNTER — Encounter: Payer: Self-pay | Admitting: Internal Medicine

## 2020-11-02 VITALS — BP 128/82 | HR 84 | Ht 62.0 in | Wt 216.2 lb

## 2020-11-02 DIAGNOSIS — M810 Age-related osteoporosis without current pathological fracture: Secondary | ICD-10-CM | POA: Diagnosis not present

## 2020-11-02 MED ORDER — DENOSUMAB 60 MG/ML ~~LOC~~ SOSY
60.0000 mg | PREFILLED_SYRINGE | Freq: Once | SUBCUTANEOUS | Status: AC
  Administered 2020-11-02: 60 mg via SUBCUTANEOUS

## 2020-11-02 NOTE — Progress Notes (Signed)
Patient ID: Holly Fields, female   DOB: [birth year], 79 y.o.   MRN: [id number]   This visit occurred during the SARS-CoV-2 public health emergency.  Safety protocols were in place, including screening questions prior to the visit, additional usage of staff PPE, and extensive cleaning of exam room while observing appropriate contact time as indicated for disinfecting solutions.   HPI  Holly Fields is a 79 y.o.-year-old female, initially referred by her PCP, Dr. Drema Dallas, returning for follow-up for osteoporosis.  I saw her in 2018, but she was lost for follow-up for visits for the last 3.5 years, only coming to the office every 6 months for Prolia injections.  She was diagnosed with osteoporosis in 2015.  Previously osteopenia.  Reviewed patient's DXA scan reports: 10/18/2020 Lumbar spine L1-L4 Femoral neck (FN)  T-score  -0.5 (<< -1.1) RFN: -1.8 LFN: -1.8   09/06/2018 ([facility]) Lumbar spine L1-L4 Femoral neck (FN)  T-score  -1.20 RFN: -2.20  LFN: -2.00  Change in BMD from previous DXA test (%) 16% increase  2% increase in RFN 5% increase in LFN   Date L1-L4 T score FN T score  09/04/2016 [facility]) -2.5 (-7.9%*) RFN: -2.3 LFN: -2.2  08/18/2014 ([place]) -1.8 RFN: -2.5 LFN: -2.1   No fractures or falls since last visit.  She denies dizziness/vertigo/orthostasis/poor vision.  Previous osteoporosis treatments:  - Fosamax >> started 10/2016 >> swelling on tongue, lips + dysphagia + skin rash (painful) + generalized pain - She has 3 Prolia injections before our last visit, and afterwards: 05/15/2017 11/21/2017 07/03/2018 02/27/2019 09/10/2019 04/07/2020 We will have another injection today.  No vitamin D deficiency: 10/11/2020: 54.2 08/19/2019:  45 08/29/2016: 48.2 04/25/2016: 53.1 08/26/2015: 45.9 Lab Results  Component Value Date   VD25OH 46.50 04/12/2017   She is on vitamin D 1000 units 3x a week.  She does not take high vitamin A doses.  She had multiple  steroid injections in the knees stopped in 2016, when she had the last TKR.  She also had surgery for spinal stenosis and continues to have sciatica pain and numbness.  She also has lymphedema.  Her legs hurt at night.  She has been seen in the pain clinic before.  She had disequilibrium, now improved.  Menopause was at 79 years old.  She does not have a family history of osteoporosis.    No history of kidney stones, hyper or hypocalcemia or hyperparathyroidism: 10/11/2020: Calcium (corrected) 10.13 (8.6-10.3) Lab Results  Component Value Date   PTH 39 04/12/2017   CALCIUM 9.3 06/14/2018   CALCIUM 9.4 05/29/2018   CALCIUM 9.9 11/15/2017   CALCIUM 9.8 10/23/2017   CALCIUM 9.6 10/03/2017   CALCIUM 9.7 04/12/2017   CALCIUM 8.7 03/03/2015   CALCIUM 8.6 03/02/2015   CALCIUM 9.4 02/18/2015   CALCIUM 9.1 10/23/2013  08/2016: 9.9  No history of thyrotoxicosis: Lab Results  Component Value Date   TSH 1.567 10/03/2017   TSH 1.62 01/05/2015   Latest HbA1c (10/11/2020): 6.8%  She has stage III CKD, 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  She sees  sees Dr. Pearson Grippe with nephrology.    Latest BUN/creatinine: 10/11/2020: BUN/Cr 24/1.68, GFR 36, ACR <7.4, Glu 103 08/19/2019: CMP normal with the exception of: Glucose 100, BUN/creatinine 22/1.59, GFR 38, ACR <11.9 Lab Results  Component Value Date   BUN 21 06/14/2018   CREATININE 1.59 (H) 06/14/2018  08/2016: 25/1.52; eGFR 41  She also has a history of GERD with history of reflux esophagitis.  ROS: Constitutional: no weight gain/no weight loss, + fatigue, no subjective hyperthermia, no subjective hypothermia, + poor sleep Eyes: no blurry vision, no xerophthalmia ENT: no sore throat, no nodules palpated in neck, no dysphagia, no odynophagia, no hoarseness Cardiovascular: no CP/no SOB/no palpitations/++ leg swelling Respiratory: no cough/no SOB/no wheezing Gastrointestinal: no N/no V/no D/+ C/+ acid reflux Musculoskeletal:  no muscle aches/no joint aches Skin: no rashes, no hair loss Neurological: no tremors/no numbness/no tingling/no dizziness, + sciatica pain  I reviewed pt's medications, allergies, PMH, social hx, family hx:  Past Medical History:  Diagnosis Date  . Arthritis   . CKD (chronic kidney disease), stage III [facility])     saw dr Florene Glen 2 years ago, now released from nephrology  . Complication of anesthesia 2010   "sluggish after anesthesia and had vertigo", slow to awaken after knee artroscopy 2012  . Diabetes ([place])   . Eczema    at times  . GERD (gastroesophageal reflux disease)   . HTN (hypertension)   . Hypercholesteremia   . Lymphedema of leg    right more than RIGHT  . Narrowing of airway 2010  . Neuropathy   . Numbness and tingling of right leg   . Osteoarthritis   . Osteopenia   . Osteoporosis   . PONV (postoperative nausea and vomiting)    pt has n/v and vertigo after anesthesia  . PSVT (paroxysmal supraventricular tachycardia) ([place])   . Sinus tachycardia   . Vertigo    at times, cannot turn on left side quickly or sleep on left side  . Vitamin D deficiency    Past Surgical History:  Procedure Laterality Date  . arthroscopic knee surgery Left 2012  . CARDIAC CATHETERIZATION  2012  . JOINT REPLACEMENT Right 2010  . LUMBAR LAMINECTOMY/DECOMPRESSION MICRODISCECTOMY N/A 10/22/2013   Procedure: MICROLUMBAR DECOMPRESSION LUMBAR TWO TO THREE, LUMBAR THREE TO FOUR and faramenotomy two,threeand four ,five bilateral;  Surgeon: Johnn Hai, MD;  Location: [facility];  Service: Orthopedics;  Laterality: N/A;  . NASAL SINUS SURGERY  yrs ago    x 2  . TOTAL KNEE ARTHROPLASTY Left 03/01/2015   Procedure: LEFT TOTAL KNEE ARTHROPLASTY;  Surgeon: Gaynelle Arabian, MD;  Location: [facility];  Service: Orthopedics;  Laterality: Left;   Social History   Socioeconomic History  . Marital status: Married    Spouse name: Not on file  . Number of children: 1  . Years of education: Not on file  .  Highest education level: Not on file  Occupational History    Comment: retired  Tobacco Use  . Smoking status: Former Smoker    Packs/day: 0.25    Years: 15.00    Pack years: 3.75    Types: Cigarettes    Quit date: 12/04/1976    Years since quitting: 43.9  . Smokeless tobacco: Never Used  Substance and Sexual Activity  . Alcohol use: Yes    Comment: very occasional wine  . Drug use: No  . Sexual activity: Not on file  Other Topics Concern  . Not on file  Social History Narrative   Caffeine- 1 drink 3 times per week (usually drinks decaf)   Left handed    Social Determinants of Health   Financial Resource Strain:   . Difficulty of Paying Living Expenses: Not on file  Food Insecurity:   . Worried About Charity fundraiser in the Last Year: Not on file  . Ran Out of Food in the Last Year: Not on file  Transportation Needs:   . Film/video editor (Medical): Not on file  . Lack of Transportation (Non-Medical): Not on file  Physical Activity:   . Days of Exercise per Week: Not on file  . Minutes of Exercise per Session: Not on file  Stress:   . Feeling of Stress : Not on file  Social Connections:   . Frequency of Communication with Friends and Family: Not on file  . Frequency of Social Gatherings with Friends and Family: Not on file  . Attends Religious Services: Not on file  . Active Member of Clubs or Organizations: Not on file  . Attends Archivist Meetings: Not on file  . Marital Status: Not on file  Intimate Partner Violence:   . Fear of Current or Ex-Partner: Not on file  . Emotionally Abused: Not on file  . Physically Abused: Not on file  . Sexually Abused: Not on file   Current Outpatient Medications on File Prior to Visit  Medication Sig Dispense Refill  . aspirin 81 MG tablet Take 81 mg by mouth daily.    [name] Kitchen atorvastatin (LIPITOR) 10 MG tablet Take 10 mg by mouth every morning.     . Cholecalciferol (VITAMIN D3) 1000 units CAPS Take 1 capsule by  mouth 3 (three) times a week.     . denosumab (PROLIA) 60 MG/ML SOLN injection Inject 60 mg into the skin every 6 (six) months. Administer in upper arm, thigh, or abdomen    . fexofenadine (ALLEGRA) 180 MG tablet Take 90 mg daily as needed by mouth for allergies. Takes 1/2 tablet     . fluticasone (FLONASE) 50 MCG/ACT nasal spray Place 2 sprays into the nose as needed for allergies.     . hydrochlorothiazide (HYDRODIURIL) 25 MG tablet Take 1 tablet (25 mg total) by mouth daily. 90 tablet 3  . JANUVIA 50 MG tablet Take 50 mg by mouth every morning.     . lansoprazole (PREVACID) 15 MG capsule Take 15 mg by mouth daily at 12 noon.    . metoprolol succinate (TOPROL-XL) 25 MG 24 hr tablet Take 1 tablet (25 mg total) by mouth daily. Take with or immediately following a meal. 90 tablet 3  . Misc Natural Products (NEURIVA PO) Take 1 capsule by mouth daily.    . NONFORMULARY OR COMPOUNDED ITEM Place onto the skin. Topiramate 2.5%, Celecoxib 2%, Gabapentin 6%, and Lidocaine 3%.  Apply 1-2 grams to the affected area 3-4 times daily.    [name] Kitchen triamcinolone cream (KENALOG) 0.1 % APPLY TO AFFECTED AREA UP TO TWICE A DAY AS NEEDED (NOT TO FACE,GROIN,UNDER ARMS)  3   No current facility-administered medications on file prior to visit.   Allergies  Allergen Reactions  . Adenosine      bp plummets  . Augmentin [Amoxicillin-Pot Clavulanate] Hives  . Codeine Nausea And Vomiting  . Doxycycline Hives  . Fosamax [Alendronate] Swelling    Lips swell  . Hydrocodone-Acetaminophen     "out of it"  . Hydromorphone Hcl Nausea And Vomiting  . Metformin And Related     diarrhea  . Neurontin [Gabapentin]     "psychotic" reactions  . Oxycodone Nausea And Vomiting  . Sulfa Antibiotics     Acute kidney injury  . Tramadol Nausea And Vomiting   Family History  Problem Relation Age of Onset  . Arrhythmia Mother   . Hyperlipidemia Mother   . Hypertension Mother   . Arrhythmia Father   . Heart attack Father   .  Heart failure Father     PE: BP 128/82   Pulse 84   Ht _0  (1.575 m)   Wt 216 lb 3.2 oz (98.1 kg)   SpO2 97%   BMI 39.54 kg/m  Wt Readings from Last 3 Encounters:  11/02/20 216 lb 3.2 oz (98.1 kg)  09/03/20 219 lb 3.2 oz (99.4 kg)  06/16/20 221 lb 3.2 oz (100.3 kg)   Constitutional: overweight, in NAD Eyes: PERRLA, EOMI, no exophthalmos ENT: moist mucous membranes, no thyromegaly, no cervical lymphadenopathy Cardiovascular: RRR, No MRG, ++ bilateral significant lymphedema Respiratory: CTA B Gastrointestinal: abdomen soft, NT, ND, BS+ Musculoskeletal: no deformities, strength intact in all 4 Skin: moist, warm, no rashes Neurological: no tremor with outstretched hands, DTR normal in all 4  Assessment: 1.  Osteoporosis  Plan: 1. Osteoporosis -Likely postmenopausal +/- steroid injections -At today's visit, we reviewed the last 2 bone density reports.  Her scores were lower than -2.5 in 2017, but improved afterwards.  On the last bone density test, her T-scores have improved at all sites, significantly so at the level of the spine and right femoral neck, but there is also slight nonsignificant improvement at the level of the left femoral neck.  I explained to the patient what the T-scores actually mean and how to look at the results. -We also discussed about the fact that Prolia appears to have worked well for her.  Aside improvement in the T-scores, she did not have any fractures. -She tolerated Prolia well, without pain in size or jaw pain.  She does have sciatica pain and is difficult to discern whether there is hip pain unrelated to this, however, she does not describe any new pain symptoms. -She had 9 Prolia injections so far -She is at higher risk for falls due to her neuropathy, sciatica, and significant leg swelling.  She walks with a cane.  Therefore, I think we should continue with Prolia for now and we can continue for at least 10 years. -At today's visit she tells me  that she is not taking her 2000 units vitamin D dose anymore, but only approximately 1000 units 3 times a week and has some other supplements with vitamin D.  Her latest vitamin D level was normal as checked by PCP earlier this month.  Therefore, we will continue the current dose -Discussed fall precautions -At this visit I reviewed her latest GFR from 10/2020.  This is 44, so for now, we can continue Prolia (this can be used if GFR is higher than 30, occasionally even lower since it is not renally excreted).  -We will not repeat her labs today -We will give her a Prolia injection today -We will need another DEXA scan in 2 years - will see pt back in 1 year   Philemon Kingdom, MD PhD [facility]ocr[facility]

## 2020-11-02 NOTE — Patient Instructions (Signed)
Please continue Prolia.  Please continue vitamin D 1000 units 3x a week.  Please return in 1 year.

## 2020-11-15 NOTE — Telephone Encounter (Signed)
Patient was given Prolia in visit on 11/02/20

## 2021-01-27 ENCOUNTER — Encounter: Payer: Self-pay | Admitting: Dietician

## 2021-01-27 ENCOUNTER — Other Ambulatory Visit: Payer: Self-pay

## 2021-01-27 ENCOUNTER — Encounter: Payer: Medicare HMO | Attending: Internal Medicine | Admitting: Dietician

## 2021-01-27 DIAGNOSIS — E119 Type 2 diabetes mellitus without complications: Secondary | ICD-10-CM | POA: Diagnosis not present

## 2021-01-27 NOTE — Progress Notes (Signed)
Diabetes Self-Management Education  Visit Type: First/Initial  Appt. Start Time: 1525 Appt. End Time: 1630  01/27/2021  [name], identified by name and date of birth, is a 80 y.o. female with a diagnosis of Diabetes: Type 2.   ASSESSMENT Patient is here today with her husband.   She was referred for type 2 diabetes. She would like to lose weight which she has gained during covid. She would like to lower her fasting blood sugar.  History includes Type 2 Diabetes, HTN, CKD stage 3, HLD, GERD, lymphedema, peripheral neuropathy, osteoporosis, vitamin D deficiency, 2 knee replacements. Labs noted to include:  A1C 6.8%, BUN 234, Creatine 1.68, eGFR 36, potassium 3.7, vitamin D 54, Cholesterol 164, Triglycerides 193, HDL 52, LDL 81 10/11/2020 Medications include:  Januvia Allergy:  Lactose Sleep interrupted due to sleep  Weight hx: 216 lbs 01/27/2021 180 lbs when caring for her mother about 5 years ago.  Patient lives with her husband.  They shop together.  Patient does the cooking but standing hurts her to cook and eats out a lot (take out) - tries to eat increased vegetables and mindful choices.) Her husband has parkinson's.   She is not able to exercise due to pain and history of 2 knee replacements. She walks when she shops but has increased pain. When the weather improves, she plans on walking up and down her driveway and slowly increase.  She walks with a cane. She has not gone to the [facility] since Covid. She does have a foot bike which she uses at times. She is a retired Tree surgeon. She wakes between 3-5 am as she is uncomfortable.  Weight 216 lb (98 kg). Body mass index is 39.51 kg/m.   Diabetes Self-Management Education - 01/27/21 1536      Visit Information   Visit Type First/Initial      Initial Visit   Diabetes Type Type 2    Are you currently following a meal plan? No    Are you taking your medications as prescribed? Yes    Date Diagnosed 1990       Health Coping   How would you rate your overall health? Fair      Psychosocial Assessment   Patient Belief/Attitude about Diabetes Motivated to manage diabetes    Self-care barriers None    Self-management support Doctor's office;Family    Other persons present Patient;Spouse/SO    Patient Concerns Nutrition/Meal planning;Glycemic Control;Weight Control    Special Needs None    Preferred Learning Style No preference indicated    Learning Readiness Ready    How often do you need to have someone help you when you read instructions, pamphlets, or other written materials from your doctor or pharmacy? 1 - Never    What is the last grade level you completed in school? Master's degree      Pre-Education Assessment   Patient understands the diabetes disease and treatment process. Needs Review    Patient understands incorporating nutritional management into lifestyle. Needs Review    Patient undertands incorporating physical activity into lifestyle. Needs Review    Patient understands using medications safely. Needs Review    Patient understands monitoring blood glucose, interpreting and using results Needs Review    Patient understands prevention, detection, and treatment of acute complications. Needs Review    Patient understands prevention, detection, and treatment of chronic complications. Needs Review    Patient understands how to develop strategies to address psychosocial issues. Needs Review    Patient  understands how to develop strategies to promote health/change behavior. Needs Review      Complications   Last HgB A1C per patient/outside source 6.8 %   10/11/2020   How often do you check your blood sugar? 3-4 times / week    Fasting Blood glucose range (mg/dL) 130-179;70-129   125-136   Number of hypoglycemic episodes per month 0    Number of hyperglycemic episodes per week 0    Have you had a dilated eye exam in the past 12 months? Yes    Have you had a dental exam in the past 12  months? Yes    Are you checking your feet? Yes    How many days per week are you checking your feet? 7      Dietary Intake   Breakfast coffee flavored protein shake, occasional     2 strips bacon, 1/2 of a regular cinnamon raisin bagel occasionally with cream cheese, occasional egg   7:30   Lunch skips    Snack (afternoon) apple or NABS or cottage cheese and fruit cocktail OR trail mix    Dinner Ribeye, spinach, potato augrautin, 1 cookie or 1 kiss   5:30   Snack (evening) occasional chips    Beverage(s) water, coffee with SF creamer, bottled Lipton Citrus tea, occasional sweet tea, rare regular Dr. Malachi Bonds      Exercise   Exercise Type ADL's      Patient Education   Previous Diabetes Education Yes (please comment)   2012   Nutrition management  Food label reading, portion sizes and measuring food.;Meal options for control of blood glucose level and chronic complications.;Other (comment)   CKD and nutrition   Physical activity and exercise  Role of exercise on diabetes management, blood pressure control and cardiac health.;Helped patient identify appropriate exercises in relation to his/her diabetes, diabetes complications and other health issue.    Medications Reviewed patients medication for diabetes, action, purpose, timing of dose and side effects.    Monitoring Taught/discussed recording of test results and interpretation of SMBG.;Identified appropriate SMBG and/or A1C goals.    Acute complications Taught treatment of hypoglycemia - the 15 rule.    Psychosocial adjustment Role of stress on diabetes;Identified and addressed patients feelings and concerns about diabetes      Individualized Goals (developed by patient)   Nutrition General guidelines for healthy choices and portions discussed    Physical Activity Exercise 3-5 times per week;15 minutes per day    Medications take my medication as prescribed    Monitoring  test my blood glucose as discussed    Reducing Risk increase  portions of healthy fats;examine blood glucose patterns      Post-Education Assessment   Patient understands the diabetes disease and treatment process. Demonstrates understanding / competency    Patient understands incorporating nutritional management into lifestyle. Needs Review    Patient undertands incorporating physical activity into lifestyle. Demonstrates understanding / competency    Patient understands using medications safely. Demonstrates understanding / competency    Patient understands monitoring blood glucose, interpreting and using results Demonstrates understanding / competency    Patient understands prevention, detection, and treatment of acute complications. Demonstrates understanding / competency    Patient understands prevention, detection, and treatment of chronic complications. Demonstrates understanding / competency    Patient understands how to develop strategies to address psychosocial issues. Demonstrates understanding / competency    Patient understands how to develop strategies to promote health/change behavior. Needs Review  Outcomes   Expected Outcomes Demonstrated interest in learning. Expect positive outcomes    Future DMSE PRN    Program Status Completed           Individualized Plan for Diabetes Self-Management Training:   Learning Objective:  Patient will have a greater understanding of diabetes self-management. Patient education plan is to attend individual and/or group sessions per assessed needs and concerns.   Plan:   Patient Instructions  Aim to be more active.  Start with 10 minutes most days and increase slowly to 30 minutes daily.  Consider armchair exercises such as:  - "Sit and Be Fit"  - Foot bike  Low sodium diet.  Ask for foods to be prepared without salt when possible  Read labels  Avoid added salt  Avoid salt sub (potassium chloride containing salts)  Avoid foods with Phos... in the ingredient list. Small portions of meat  and other proteins. Be mindful when you are eating out.  Baked rather than fried most often  Mindfulness:  Choices  Eat slowly  Stop when satisfied  Calorie Edison Pace  Expected Outcomes:  Demonstrated interest in learning. Expect positive outcomes  Education material provided: ADA - How to Thrive: A Guide for Your Journey with Diabetes and Snack sheet, NKD National kidney diet placemat for those without dialysis, Dining out tips  If problems or questions, patient to contact team via:  Phone  Future DSME appointment: PRN

## 2021-01-27 NOTE — Patient Instructions (Addendum)
Aim to be more active.  Start with 10 minutes most days and increase slowly to 30 minutes daily.  Consider armchair exercises such as:  - "Sit and Be Fit"  - Foot bike  Low sodium diet.  Ask for foods to be prepared without salt when possible  Read labels  Avoid added salt  Avoid salt sub (potassium chloride containing salts)  Avoid foods with Phos... in the ingredient list. Small portions of meat and other proteins. Be mindful when you are eating out.  Baked rather than fried most often  Mindfulness:  Choices  Eat slowly  Stop when satisfied

## 2021-03-29 ENCOUNTER — Telehealth: Payer: Self-pay

## 2021-03-29 NOTE — Telephone Encounter (Signed)
Prolia VOB initiated via [email or web address]  Last OV 11/02/20 Next OV  Last Prolia inj 11/02/20 Next Prolia inj due 05/03/21

## 2021-03-30 NOTE — Telephone Encounter (Signed)
MEDICAL BENEFIT SUMMARY Patient Out-of-Pocket Responsibility Coverage Available Authorization Required Deductible Co-pay/Coinsurance Prolia OOP COST PHYSICIAN FACILITY FEE ADMIN FEE PURCHASE OR REFERRAL: YES YES PA Required PRIMARY No SECONDARY No No* No* No* SPECIALTY PHARMACY (via Medical Benefit): NO YES No* No* No* *Reflects patient costs once plan deductible is met. Please see Medical Benefit Details for further information regarding patient costs. Patient costs may vary based on services rendered. BENEFITS VERIFIED FOR THE FOLLOWING DIAGNOSIS AND INSURANCE PLANS Verified for Diagnosis M81.0 Site of Care  MD Office   Policy Level: Primary Policy Status: Active Payer Name: [place] Plan Name: Medicare (O87) ESA PPO Policy Number: [id number] Employer Name: [employer] Plan Type: Medicare Managed Care Group Number: [id number]  Payer Phone: [phone] PRIMARY MEDICAL BENEFIT DETAILS (PHYSICIAN PURCHASE, OR REFERRAL TO TREATING SITE) COVERAGE AVAILABLE: Yes COVERAGE DETAILS: Product will be covered at 100% of the contracted rate. No deductible or coinsurance applies. The benefits provided on this Verification of Benefits form are Medical Benefits and are the patient's In-Network benefits for Prolia. If you would like Pharmacy Benefits for Prolia, please call [phone].   AUTHORIZATION REQUIRED: Yes   PA PROCESS DETAILS: PA is required. Call [phone] or complete the PA form available at [email or web address]

## 2021-04-02 NOTE — Telephone Encounter (Addendum)
Prolia PA initiated via [email or web address]  Gorden Harms (Key: [id number])Need help? Call us at [phone] Status Sent to [employer] Next Steps The plan will fax you a determination, typically within 1 to 5 business days.  Next Steps To follow up, call the plan at [phone]   Drug Prolia 60MG /ML syringes Form [employer] Specialty Medicare Part B Prolia and Injectable Precertification Request Form Precertification for Prolia and Xgeva [phone]

## 2021-04-12 ENCOUNTER — Other Ambulatory Visit: Payer: Self-pay

## 2021-04-12 ENCOUNTER — Ambulatory Visit (INDEPENDENT_AMBULATORY_CARE_PROVIDER_SITE_OTHER): Payer: Medicare HMO | Admitting: Allergy & Immunology

## 2021-04-12 ENCOUNTER — Encounter: Payer: Self-pay | Admitting: Allergy & Immunology

## 2021-04-12 VITALS — BP 132/82 | HR 75 | Temp 97.8°F | Resp 17 | Ht 61.75 in | Wt 217.8 lb

## 2021-04-12 DIAGNOSIS — J3089 Other allergic rhinitis: Secondary | ICD-10-CM | POA: Diagnosis not present

## 2021-04-12 DIAGNOSIS — J302 Other seasonal allergic rhinitis: Secondary | ICD-10-CM | POA: Diagnosis not present

## 2021-04-12 MED ORDER — LEVOCETIRIZINE DIHYDROCHLORIDE 5 MG PO TABS: 5.0000 mg | ORAL_TABLET | Freq: Every evening | ORAL | 5 refills | Status: DC

## 2021-04-12 MED ORDER — AZELASTINE HCL 0.15 % NA SOLN: 2.0000 | Freq: Two times a day (BID) | NASAL | 5 refills | Status: DC

## 2021-04-12 NOTE — Progress Notes (Signed)
NEW PATIENT  Date of Service/Encounter:  04/12/21  Consult requested by: Lorenda Ishihara, MD   Assessment:   Seasonal and perennial allergic rhinitis (grasses, indoor molds and outdoor molds)  Plan/Recommendations:   1. Chronic rhinitis - Testing today showed: grasses, indoor molds and outdoor molds (I am not convinced that these explain your symptoms) - Copy of test results provided.  - Avoidance measures provided. - Stop taking: Nasacort and Flonase as well as Zyrtec - Start taking: Xyzal (levocetirizine) 5mg  tablet once daily and Astelin (azelastine) 2 sprays per nostril 1-2 times daily as needed - You can use an extra dose of the antihistamine, if needed, for breakthrough symptoms.  - Consider nasal saline rinses 1-2 times daily to remove allergens from the nasal cavities as well as help with mucous clearance (this is especially helpful to do before the nasal sprays are given) - We will keep working on finding a better regimen for your nasal symptoms.  2. Return in about 4 weeks (around 05/10/2021).    This note in its entirety was forwarded to the Provider who requested this consultation.  Subjective:   Holly Fields is a 80 y.o. female presenting today for evaluation of  Chief Complaint  Patient presents with  . Allergic Rhinitis   . Sinusitis    Holly Fields has a history of the following: Patient Active Problem List   Diagnosis Date Noted  . Lymphedema 11/06/2017  . PSVT (paroxysmal supraventricular tachycardia) (HCC)   . Sinus tachycardia   . HTN (hypertension)   . Osteoporosis without current pathological fracture 04/13/2017  . OA (osteoarthritis) of knee 03/01/2015  . Preoperative cardiovascular examination 01/05/2015  . Spinal stenosis of lumbar region 10/22/2013    History obtained from: chart review and patient.  10/24/2013 was referred by Rayvon Char, MD.     Holly Fields is a 80 y.o. female presenting for an evaluation of chronic  rhinitis.   Allergic Rhinitis Symptom History: She has been having ear itching and runny eyes and skin itching for months. She reports that she is having a lot of mucous in her throat. This is every morning and generally thick and clear. She has only done the OTC allergy medications. Basically she switches them up a lot. She has been using the Xyzal lately and this seems to have helped the most with her symptoms. The mucous has improved a lot as well.  She has a history of recurrent sinusitis. She has had two nasal surgeries which did help. This was done when [place] lived [place]. She has not had a sinus infection in years. She does have a lot of medication allergies, including antibiotics.   She did have testing years ago when she lived in [place]. She was never on shots but she was allergic to grasses and trees. She retired to this area and moved here after retirement. They  Were trying to escape the hectic life.  They have one son in [place], [place]. He has one grandson.   Otherwise, there is no history of other atopic diseases, including food allergies, drug allergies, stinging insect allergies, eczema, urticaria or contact dermatitis. There is no significant infectious history. Vaccinations are up to date.    Past Medical History: Patient Active Problem List   Diagnosis Date Noted  . Lymphedema 11/06/2017  . PSVT (paroxysmal supraventricular tachycardia) (HCC)   . Sinus tachycardia   . HTN (hypertension)   . Osteoporosis without current pathological fracture 04/13/2017  .  OA (osteoarthritis) of knee 03/01/2015  . Preoperative cardiovascular examination 01/05/2015  . Spinal stenosis of lumbar region 10/22/2013    Medication List:  Allergies as of 04/12/2021      Reactions   Adenosine     bp plummets   Augmentin [amoxicillin-pot Clavulanate] Hives   Codeine Nausea And Vomiting   Doxycycline Hives   Fosamax [alendronate] Swelling   Lips swell   Gabapentin  Other (See Comments)   "psychotic" reactions   Hydrocodone-acetaminophen    "out of it"   Hydromorphone Hcl Nausea And Vomiting   Lactose Intolerance (gi)    Metformin And Related    diarrhea   Other Other (See Comments)   Oxycodone Nausea And Vomiting   Sulfa Antibiotics    Acute kidney injury   Sulfamethoxazole-trimethoprim Other (See Comments)   Tramadol Nausea And Vomiting      Medication List       Accurate as of Apr 12, 2021  6:21 PM. If you have any questions, ask your nurse or doctor.        ALIGN PO Take by mouth.   aspirin 81 MG tablet Take 81 mg by mouth daily.   atorvastatin 10 MG tablet Commonly known as: LIPITOR Take 10 mg by mouth every morning.   cetirizine 10 MG tablet Commonly known as: ZYRTEC 1 tablet   denosumab 60 MG/ML Soln injection Commonly known as: PROLIA Inject 60 mg into the skin every 6 (six) months. Administer in upper arm, thigh, or abdomen   famotidine 20 MG tablet Commonly known as: PEPCID 1 tablet at bedtime as needed   fexofenadine 180 MG tablet Commonly known as: ALLEGRA Take 90 mg by mouth daily as needed for allergies. Takes 1/2 tablet   fluticasone 50 MCG/ACT nasal spray Commonly known as: FLONASE Place 2 sprays into the nose as needed for allergies.   guaiFENesin 600 MG 12 hr tablet Commonly known as: MUCINEX 1 tablet as needed   hydrochlorothiazide 25 MG tablet Commonly known as: HYDRODIURIL Take 1 tablet (25 mg total) by mouth daily.   Januvia 50 MG tablet Generic drug: sitaGLIPtin Take 50 mg by mouth every morning.   lansoprazole 15 MG capsule Commonly known as: PREVACID Take 15 mg by mouth daily at 12 noon.   metoprolol succinate 25 MG 24 hr tablet Commonly known as: TOPROL-XL Take 1 tablet (25 mg total) by mouth daily. Take with or immediately following a meal.   NEURIVA PO Take 1 capsule by mouth daily.   NON FORMULARY Nervolink   NONFORMULARY OR COMPOUNDED ITEM Place onto the skin.  Topiramate 2.5%, Celecoxib 2%, Gabapentin 6%, and Lidocaine 3%.  Apply 1-2 grams to the affected area 3-4 times daily.   OneTouch Delica Lancets 33G Misc See admin instructions.   OneTouch Verio test strip Generic drug: glucose blood See admin instructions.   Simethicone 125 MG Caps 3 capsules   triamcinolone cream 0.1 % Commonly known as: KENALOG APPLY TO AFFECTED AREA UP TO TWICE A DAY AS NEEDED (NOT TO FACE,GROIN,UNDER ARMS)   Vitamin D3 25 MCG (1000 UT) Caps Take 1 capsule by mouth 3 (three) times a week.       Birth History: non-contributory  Developmental History: non-contributory  Past Surgical History: Past Surgical History:  Procedure Laterality Date  . arthroscopic knee surgery Left 2012  . CARDIAC CATHETERIZATION  2012  . JOINT REPLACEMENT Right 2010  . LUMBAR LAMINECTOMY/DECOMPRESSION MICRODISCECTOMY N/A 10/22/2013   Procedure: MICROLUMBAR DECOMPRESSION LUMBAR TWO TO THREE, LUMBAR THREE TO  FOUR and faramenotomy two,threeand four ,five bilateral;  Surgeon: Javier Docker, MD;  Location: [facility];  Service: Orthopedics;  Laterality: N/A;  . NASAL SINUS SURGERY  yrs ago    x 2  . TOTAL KNEE ARTHROPLASTY Left 03/01/2015   Procedure: LEFT TOTAL KNEE ARTHROPLASTY;  Surgeon: Ollen Gross, MD;  Location: [facility];  Service: Orthopedics;  Laterality: Left;     Family History: Family History  Problem Relation Age of Onset  . Arrhythmia Mother   . Hyperlipidemia Mother   . Hypertension Mother   . Allergic rhinitis Mother   . Arrhythmia Father   . Heart attack Father   . Heart failure Father      Social History: Holly Fields lives at home with her family. There are hardwoods throughout the home. They live in a house that is 78 years old. There is gas and electric heating and central cooling. There are no indoor animals. There are no dust mite coverings on the bedding. 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      Review of Systems  Constitutional: Negative.  Negative for chills, fever, malaise/fatigue and weight loss.  HENT: Positive for congestion. Negative for ear discharge, ear pain, sinus pain and sore throat.        Positive for postnasal drip. Positive for congestion.  Eyes: Negative for pain, discharge and redness.  Respiratory: Negative for cough, sputum production, shortness of breath and wheezing.   Cardiovascular: Negative.  Negative for chest pain and palpitations.  Gastrointestinal: Negative for abdominal pain, constipation, diarrhea, heartburn, nausea and vomiting.  Skin: Negative.  Negative for itching and rash.  Neurological: Negative for dizziness and headaches.  Endo/Heme/Allergies: Negative for environmental allergies. Does not bruise/bleed easily.       Objective:   Blood pressure 132/82, pulse 75, temperature 97.8 F (36.6 C), temperature source Temporal, resp. rate 17, height 5' 1.75" (1.568 m), weight 217 lb 12.8 oz (98.8 kg), SpO2 95 %. Body mass index is 40.16 kg/m.   Physical Exam:   Physical Exam Constitutional:      Appearance: She is well-developed.  HENT:     Head: Normocephalic and atraumatic.     Right Ear: Tympanic membrane, ear canal and external ear normal. No drainage, swelling or tenderness. Tympanic membrane is not injected, scarred, erythematous, retracted or bulging.     Left Ear: Tympanic membrane, ear canal and external ear normal. No drainage, swelling or tenderness. Tympanic membrane is not injected, scarred, erythematous, retracted or bulging.     Nose: No nasal deformity, septal deviation, mucosal edema or rhinorrhea.     Right Turbinates: Enlarged, swollen and pale.     Left Turbinates: Enlarged, swollen and pale.     Right Sinus: No maxillary sinus tenderness or frontal sinus tenderness.     Left Sinus: No maxillary sinus tenderness or frontal sinus tenderness.     Comments: Positive for some cobblestoning.     Mouth/Throat:      Mouth: Mucous membranes are not pale and not dry.     Pharynx: Uvula midline.  Eyes:     General:        Right eye: No discharge.        Left eye: No discharge.     Conjunctiva/sclera: Conjunctivae normal.     Right eye: Right conjunctiva is not injected. No chemosis.    Left eye: Left conjunctiva is not injected. No chemosis.    Pupils:  Pupils are equal, round, and reactive to light.  Cardiovascular:     Rate and Rhythm: Normal rate and regular rhythm.     Heart sounds: Normal heart sounds.  Pulmonary:     Effort: Pulmonary effort is normal. No tachypnea, accessory muscle usage or respiratory distress.     Breath sounds: Normal breath sounds. No wheezing, rhonchi or rales.     Comments: Moving air well in all lung fields. No increased work of breathing noted.  Chest:     Chest wall: No tenderness.  Abdominal:     Tenderness: There is no abdominal tenderness. There is no guarding or rebound.  Lymphadenopathy:     Head:     Right side of head: No submandibular, tonsillar or occipital adenopathy.     Left side of head: No submandibular, tonsillar or occipital adenopathy.     Cervical: No cervical adenopathy.  Skin:    General: Skin is warm.     Capillary Refill: Capillary refill takes less than 2 seconds.     Coloration: Skin is not pale.     Findings: No abrasion, erythema, petechiae or rash. Rash is not papular, urticarial or vesicular.     Comments: No eczematous or urticarial lesions noted.   Neurological:     Mental Status: She is alert.  Psychiatric:        Behavior: Behavior is cooperative.      Diagnostic studies:   Allergy Studies:     Airborne Adult Perc - 04/12/21 1414    Time Antigen Placed 1414    Allergen Manufacturer Waynette Buttery    Location Back    Number of Test 59    1. Control-Buffer 50% Glycerol Negative    2. Control-Histamine 1 mg/ml 2+    3. Albumin saline Negative    4. Bahia Negative    5. French Southern Territ[place] Negative    6. Johnson Negative    7. [place]  Blue Negative    8. Meadow Fescue Negative    9. Perennial Rye Negative    10. Sweet Vernal Negative    11. Timothy Negative    12. Cocklebur Negative    13. Burweed Marshelder Negative    14. Ragweed, short Negative    15. Ragweed, Giant Negative    16. Plantain,  English Negative    17. Lamb's Quarters Negative    18. Sheep Sorrell Negative    19. Rough Pigweed Negative    20. Marsh Elder, Rough Negative    21. Mugwort, Common Negative    22. Ash mix Negative    23. Birch mix Negative    24. Beech American Negative    25. Box, Elder Negative    26. Cedar, red Negative    27. Cottonwood, Guinea[place]issau Negative    28. Elm mix Negative    29. Hickory Negative    30. Maple mix Negative    31. Oak, Guinea[place]iss[place] mix Negative    32. Pecan Pollen Negative    33. Pine mix Negative    34. Sycamore Eastern Negative    35. Walnut, Black Pollen Negative    36. Alternaria alternata Negative    37. Cladosporium Herbarum Negative    38. Aspergillus mix Negative    39. Penicillium mix Negative    40. Bipolaris sorokiniana (Helminthosporium) Negative    41. Drechslera spicifera (Curvularia) Negative    42. Mucor plumbeus Negative    43. Fusarium moniliforme Negative    44. Aureobasidium pullulans (pullulara) Negative    45.  Rhizopus oryzae Negative    46. Botrytis cinera 2+    47. Epicoccum nigrum Negative    48. Phoma betae Negative    49. Candida Albicans Negative    50. Trichophyton mentagrophytes Negative    51. Mite, D Farinae  5,000 AU/ml Negative    52. Mite, D Pteronyssinus  5,000 AU/ml Negative    53. Cat Hair 10,000 BAU/ml Negative    54.  Dog Epithelia Negative    55. Mixed Feathers Negative    56. Horse Epithelia Negative    57. Cockroach, German Negative    58. Mouse Negative    59. Tobacco Leaf Negative          Intradermal - 04/12/21 1443    Time Antigen Placed 1445    Allergen Manufacturer Waynette Buttery    Location Arm    Number of Test 15    Control Negative    French Southern Territ[place]  Negative    Johnson 1+    7 Grass Negative    Ragweed mix Negative    Weed mix Negative    Tree mix Negative    Mold 1 Negative    Mold 2 Negative    Mold 3 2+    Mold 4 Negative    Cat Negative    Dog Negative    Cockroach Negative    Mite mix Negative           Allergy testing results were read and interpreted by myself, documented by clinical staff.         Malachi Bonds, MD [facility]

## 2021-04-12 NOTE — Patient Instructions (Addendum)
1. Chronic rhinitis - Testing today showed: grasses, indoor molds and outdoor molds (I am not convinced that these explain your symptoms) - Copy of test results provided.  - Avoidance measures provided. - Stop taking: Nasacort and Flonase as well as Zyrtec - Start taking: Xyzal (levocetirizine) 5mg  tablet once daily and Astelin (azelastine) 2 sprays per nostril 1-2 times daily as needed - You can use an extra dose of the antihistamine, if needed, for breakthrough symptoms.  - Consider nasal saline rinses 1-2 times daily to remove allergens from the nasal cavities as well as help with mucous clearance (this is especially helpful to do before the nasal sprays are given) - We will keep working on finding a better regimen for your nasal symptoms.  2. Return in about 4 weeks (around 05/10/2021).    Please inform 07/10/2021 of any Emergency Department visits, hospitalizations, or changes in symptoms. Call us before going to the ED for breathing or allergy symptoms since we might be able to fit you in for a sick visit. Feel free to contact us anytime with any questions, problems, or concerns.  It was a pleasure to meet you today!  Websites that have reliable patient information: 1. American Academy of Asthma, Allergy, and Immunology: www.aaaai.org 2. Food Allergy Research and Education (FARE): foodallergy.org 3. Mothers of Asthmatics: http://www.asthmacommunitynetwork.org 4. American College of Allergy, Asthma, and Immunology: www.acaai.org   COVID-19 Vaccine Information can be found at: [place] For questions related to vaccine distribution or appointments, please email [email or web address] or call [phone].   We realize that you might be concerned about having an allergic reaction to the COVID19 vaccines. To help with that concern, WE ARE OFFERING THE COVID19 VACCINES IN OUR OFFICE! Ask the front desk for dates!     "Like" [phone]  on Facebook and Instagram for our latest updates!      A healthy democracy works best when [place] participate! Make sure you are registered to vote! If you have moved or changed any of your contact information, you will need to get this updated before voting!  In some cases, you MAY be able to register to vote online: [employer]    1. Control-Buffer 50% Glycerol Negative   2. Control-Histamine 1 mg/ml 2+   3. Albumin saline Negative   4. Bahia Negative   5. [email or web address] Negative   6. Johnson Negative   7. Kentucky Blue Negative   8. Meadow Fescue Negative   9. Perennial Rye Negative   10. Sweet Vernal Negative   11. Timothy Negative   12. Cocklebur Negative   13. Burweed Marshelder Negative   14. Ragweed, short Negative   15. Ragweed, Giant Negative   16. Plantain,  English Negative   17. Lamb's Quarters Negative   18. Sheep Sorrell Negative   19. Rough Pigweed Negative   20. Marsh Elder, Rough Negative   21. Mugwort, Common Negative   22. Ash mix Negative   23. Birch mix Negative   24. Beech American Negative   25. Box, Elder Negative   26. Cedar, red Negative   27. Cottonwood, [place] Territ[place] Negative   28. Elm mix Negative   29. Hickory Negative   30. Maple mix Negative   31. Oak, Guinea[place]issau mix Negative   32. Pecan Pollen Negative   33. Pine mix Negative   34. Sycamore Eastern Negative   35. Walnut, Black Pollen Negative   36. Alternaria alternata Negative   37. Cladosporium Herbarum Negative   38. Aspergillus mix Negative  39. Penicillium mix Negative   40. Bipolaris sorokiniana (Helminthosporium) Negative   41. Drechslera spicifera (Curvularia) Negative   42. Mucor plumbeus Negative   43. Fusarium moniliforme Negative   44. Aureobasidium pullulans (pullulara) Negative   45. Rhizopus oryzae Negative   46. Botrytis cinera 2+   47. Epicoccum nigrum Negative   48. Phoma betae Negative   49. Candida Albicans Negative   50.  Trichophyton mentagrophytes Negative   51. Mite, D Farinae  5,000 AU/ml Negative   52. Mite, D Pteronyssinus  5,000 AU/ml Negative   53. Cat Hair 10,000 BAU/ml Negative   54.  Dog Epithelia Negative   55. Mixed Feathers Negative   56. Horse Epithelia Negative   57. Cockroach, German Negative   58. Mouse Negative   59. Tobacco Leaf Negative     Control Negative   [place] Negative   Johnson 1+   7 Grass Negative   Ragweed mix Negative   Weed mix Negative   Tree mix Negative   Mold 1 Negative   Mold 2 Negative   Mold 3 2+   Mold 4 Negative   Cat Negative   Dog Negative   Cockroach Negative   Mite mix Negative     Reducing Pollen Exposure  The American Academy of Allergy, Asthma and Immunology suggests the following steps to reduce your exposure to pollen during allergy seasons.    1. Do not hang sheets or clothing out to dry; pollen may collect on these items. 2. Do not mow lawns or spend time around freshly cut grass; mowing stirs up pollen. 3. Keep windows closed at night.  Keep car windows closed while driving. 4. Minimize morning activities outdoors, a time when pollen counts are usually at their highest. 5. Stay indoors as much as possible when pollen counts or humidity is high and on windy days when pollen tends to remain in the air longer. 6. Use air conditioning when possible.  Many air conditioners have filters that trap the pollen spores. 7. Use a HEPA room air filter to remove pollen form the indoor air you breathe.  Control of Mold Allergen   Mold and fungi can grow on a variety of surfaces provided certain temperature and moisture conditions exist.  Outdoor molds grow on plants, decaying vegetation and soil.  The major outdoor mold, Alternaria and Cladosporium, are found in very high numbers during hot and dry conditions.  Generally, a late Summer - Fall peak is seen for common outdoor fungal spores.  Rain will temporarily lower outdoor mold spore count, but  counts rise rapidly when the rainy period ends.  The most important indoor molds are Aspergillus and Penicillium.  Dark, humid and poorly ventilated basements are ideal sites for mold growth.  The next most common sites of mold growth are the bathroom and the kitchen.  Outdoor (Seasonal) Mold Control   1. Use air conditioning and keep windows closed 2. Avoid exposure to decaying vegetation. 3. Avoid leaf raking. 4. Avoid grain handling. 5. Consider wearing a face mask if working in moldy areas.    Indoor (Perennial) Mold Control     1. Maintain humidity below 50%. 2. Clean washable surfaces with 5% bleach solution. 3. Remove sources e.g. contaminated carpets.

## 2021-05-05 NOTE — Telephone Encounter (Signed)
Pt ready for scheduling on or after 05/03/21 (PAST DUE)  Out-of-pocket cost due at time of visit: $0.00  Primary: [employer] [place] co-insurance: 0% Admin fee co-insurance: 0%  Secondary: n/a Prol[place] co-insurance:  Admin fee co-insurance:   Deductible: does not apply  Prior Auth: APPROVED PA# [id number] Valid: 04/02/21-04/02/22

## 2021-05-09 NOTE — Telephone Encounter (Signed)
appt scheduled for 05/13/21

## 2021-05-10 ENCOUNTER — Ambulatory Visit (INDEPENDENT_AMBULATORY_CARE_PROVIDER_SITE_OTHER): Payer: Medicare HMO | Admitting: Allergy & Immunology

## 2021-05-10 ENCOUNTER — Other Ambulatory Visit: Payer: Self-pay

## 2021-05-10 ENCOUNTER — Encounter: Payer: Self-pay | Admitting: Allergy & Immunology

## 2021-05-10 VITALS — BP 134/76 | HR 86 | Temp 97.6°F | Resp 16 | Ht 62.0 in | Wt 218.6 lb

## 2021-05-10 DIAGNOSIS — J3089 Other allergic rhinitis: Secondary | ICD-10-CM

## 2021-05-10 DIAGNOSIS — L853 Xerosis cutis: Secondary | ICD-10-CM | POA: Diagnosis not present

## 2021-05-10 DIAGNOSIS — J302 Other seasonal allergic rhinitis: Secondary | ICD-10-CM

## 2021-05-10 MED ORDER — MOMETASONE FUROATE 50 MCG/ACT NA SUSP: 2.0000 | Freq: Every day | NASAL | 5 refills | Status: DC

## 2021-05-10 MED ORDER — LEVOCETIRIZINE DIHYDROCHLORIDE 5 MG PO TABS: 5.0000 mg | ORAL_TABLET | Freq: Every evening | ORAL | 5 refills | Status: DC

## 2021-05-10 MED ORDER — OLOPATADINE HCL 0.2 % OP SOLN: 1.0000 [drp] | Freq: Two times a day (BID) | OPHTHALMIC | 5 refills | Status: AC

## 2021-05-10 NOTE — Progress Notes (Signed)
FOLLOW UP  Date of Service/Encounter:  05/10/21   Assessment:   Seasonal and perennial allergic rhinitis (grasses, indoor molds and outdoor molds)  Dry skin  Plan/Recommendations:   1. Chronic rhinitis (grasses, indoor molds and outdoor molds)  - Stop the Astelin.  - Start Nasonex one spray daily to see if this works better than the Astelin. - Stop taking: Astelin - Continue taking: Xyzal (levocetirizine) 5mg  tablet once daily (CAN CHANGE TO THE MORNING WITHOUT A PROBLEM) - Start taking: Nasonex one spray per nostril - You can use an extra dose of the antihistamine, if needed, for breakthrough symptoms and Mucinex twice daily to keep things flowing. - Consider nasal saline rinses 1-2 times daily to remove allergens from the nasal cavities as well as help with mucous clearance (this is especially helpful to do before the nasal sprays are given) - We will keep working on finding a better regimen for your nasal symptoms.  2. Dry skin - Start moisturizing 2-3 times daily. - Vanicream samples provided. - Hopefully adding the cetirizine will help with the itching. - We could consider the addition of a topical steroid in the future if needed.  3. Return in about 6 weeks (around 06/21/2021).      Subjective:   Holly Fields is a 80 y.o. female presenting today for follow up of  Chief Complaint  Patient presents with  . Allergic Rhinitis     Has not had any flare the medication has been helping. Has been having sinus headaches, and congestion not able to get the mucus out. Has been feeling fatigue and woozy - for 2 weeks. Nose spray is very strong possible cause of thick mucus.     Holly Fields has a history of the following: Patient Active Problem List   Diagnosis Date Noted  . Seasonal and perennial allergic rhinitis 05/10/2021  . Dry skin 05/10/2021  . Lymphedema 11/06/2017  . PSVT (paroxysmal supraventricular tachycardia) (HCC)   . Sinus tachycardia   . HTN  (hypertension)   . Osteoporosis without current pathological fracture 04/13/2017  . OA (osteoarthritis) of knee 03/01/2015  . Preoperative cardiovascular examination 01/05/2015  . Spinal stenosis of lumbar region 10/22/2013    History obtained from: chart review and patient.  Holly Fields is a 80 y.o. female presenting for a follow up visit.  She was last seen in May 2022.  At that time, testing was positive to grasses, indoor molds, and outdoor molds.  We stopped her Nasacort and Flonase and Zyrtec.  We started Xyzal and Astelin.  Since the last visit, she has done well. She is using on one spray of the Astelin since it is "too strong". She has had decreased sneezing so there is that. She is now too dry. She has been using the Xyzal at night. She was taking it in the morning previously before she saw me. She is wondering whether she can take it in the morning. She also feels somewhat loopy in the morning time.   At this point, her eyes and watering is a nuisance. She is on Pataday and this helps somewhat. Overnight, the mucous is thick that comes out of her eyes is thick. She has been doing the Pataday only once daily.   She also complains of dry skin.  It is particularly bad lateral arms.  She has been using a lot of moisturizers.  She reports that during the winter that she just flakes skin all over the floor.  She does  not have a topical steroid.  She has never seen a dermatologist.  She is planning to stay from [facility] for the summer.  They were going to do some traveling, but with COVID numbers ticking up, they are more worried about it.  Otherwise, there have been no changes to her past medical history, surgical history, family history, or social history.    Review of Systems  Constitutional: Negative.  Negative for chills, fever, malaise/fatigue and weight loss.  HENT: Positive for congestion. Negative for ear discharge, ear pain and sinus pain.   Eyes: Negative for pain, discharge and  redness.  Respiratory: Negative for cough, sputum production, shortness of breath and wheezing.   Cardiovascular: Negative.  Negative for chest pain and palpitations.  Gastrointestinal: Negative for abdominal pain, constipation, diarrhea, heartburn, nausea and vomiting.  Skin: Negative.  Negative for itching and rash.  Neurological: Negative for dizziness and headaches.  Endo/Heme/Allergies: Positive for environmental allergies. Does not bruise/bleed easily.       Objective:   Blood pressure 134/76, pulse 86, temperature 97.6 F (36.4 C), resp. rate 16, height 5\' 2"  (1.575 m), weight 218 lb 9.6 oz (99.2 kg), SpO2 98 %. Body mass index is 39.98 kg/m.   Physical Exam:  Physical Exam Constitutional:      Appearance: She is well-developed.  HENT:     Head: Normocephalic and atraumatic.     Right Ear: Tympanic membrane, ear canal and external ear normal.     Left Ear: Tympanic membrane, ear canal and external ear normal.     Nose: No nasal deformity, septal deviation, mucosal edema or rhinorrhea.     Right Turbinates: Enlarged and swollen.     Left Turbinates: Enlarged and swollen.     Right Sinus: No maxillary sinus tenderness or frontal sinus tenderness.     Left Sinus: No maxillary sinus tenderness or frontal sinus tenderness.     Comments: No polyps.    Mouth/Throat:     Mouth: Mucous membranes are not pale and not dry.     Pharynx: Uvula midline.  Eyes:     General:        Right eye: No discharge.        Left eye: No discharge.     Conjunctiva/sclera: Conjunctivae normal.     Right eye: Right conjunctiva is not injected. No chemosis.    Left eye: Left conjunctiva is not injected. No chemosis.    Pupils: Pupils are equal, round, and reactive to light.  Cardiovascular:     Rate and Rhythm: Normal rate and regular rhythm.     Heart sounds: Normal heart sounds.  Pulmonary:     Effort: Pulmonary effort is normal. No tachypnea, accessory muscle usage or respiratory  distress.     Breath sounds: Normal breath sounds. No wheezing, rhonchi or rales.     Comments: Moving air well in all lung fields. Chest:     Chest wall: No tenderness.  Lymphadenopathy:     Cervical: No cervical adenopathy.  Skin:    General: Skin is warm.     Capillary Refill: Capillary refill takes less than 2 seconds.     Coloration: Skin is not pale.     Findings: No abrasion, erythema, petechiae or rash. Rash is not papular, urticarial or vesicular.     Comments: Ichthyotic skin on bilateral arms.  Neurological:     Mental Status: She is alert.  Psychiatric:        Behavior: Behavior is cooperative.  Diagnostic studies: none      Salvatore Marvel, MD  [facility]

## 2021-05-10 NOTE — Patient Instructions (Addendum)
1. Chronic rhinitis (grasses, indoor molds and outdoor molds)  - Stop the Astelin.  - Start Nasonex one spray daily to see if this works better than the Astelin. - Stop taking: Astelin - Continue taking: Xyzal (levocetirizine) 5mg  tablet once daily (CAN CHANGE TO THE MORNING WITHOUT A PROBLEM) - Start taking: Nasonex one spray per nostril - You can use an extra dose of the antihistamine, if needed, for breakthrough symptoms and Mucinex twice daily to keep things flowing. - Consider nasal saline rinses 1-2 times daily to remove allergens from the nasal cavities as well as help with mucous clearance (this is especially helpful to do before the nasal sprays are given) - We will keep working on finding a better regimen for your nasal symptoms.  2 Return in about 6 weeks (around 06/21/2021).    Please inform 06/23/2021 of any Emergency Department visits, hospitalizations, or changes in symptoms. Call us before going to the ED for breathing or allergy symptoms since we might be able to fit you in for a sick visit. Feel free to contact us anytime with any questions, problems, or concerns.  It was a pleasure to see you again today!  Websites that have reliable patient information: 1. American Academy of Asthma, Allergy, and Immunology: www.aaaai.org 2. Food Allergy Research and Education (FARE): foodallergy.org 3. Mothers of Asthmatics: http://www.asthmacommunitynetwork.org 4. American College of Allergy, Asthma, and Immunology: www.acaai.org   COVID-19 Vaccine Information can be found at: [place] For questions related to vaccine distribution or appointments, please email [email or web address] or call [phone].   We realize that you might be concerned about having an allergic reaction to the COVID19 vaccines. To help with that concern, WE ARE OFFERING THE COVID19 VACCINES IN OUR OFFICE! Ask the front desk for dates!     "Like" [phone]  on Facebook and Instagram for our latest updates!      A healthy democracy works best when [place] participate! Make sure you are registered to vote! If you have moved or changed any of your contact information, you will need to get this updated before voting!  In some cases, you MAY be able to register to vote online: [employer]

## 2021-05-13 ENCOUNTER — Other Ambulatory Visit: Payer: Self-pay

## 2021-05-13 ENCOUNTER — Telehealth: Payer: Self-pay | Admitting: Internal Medicine

## 2021-05-13 ENCOUNTER — Ambulatory Visit (INDEPENDENT_AMBULATORY_CARE_PROVIDER_SITE_OTHER): Payer: Medicare HMO | Admitting: *Deleted

## 2021-05-13 DIAGNOSIS — M810 Age-related osteoporosis without current pathological fracture: Secondary | ICD-10-CM

## 2021-05-13 MED ORDER — DENOSUMAB 60 MG/ML ~~LOC~~ SOSY
60.0000 mg | PREFILLED_SYRINGE | Freq: Once | SUBCUTANEOUS | Status: AC
  Administered 2021-05-13: 60 mg via SUBCUTANEOUS

## 2021-05-13 NOTE — Progress Notes (Addendum)
Per orders of Dr Gherghe, injection of denosumab (PROLIA) injection 60 mg/mL given by Jaliel Deavers, CMA.. Patient tolerated injection well.  

## 2021-05-13 NOTE — Telephone Encounter (Signed)
Please advise 

## 2021-05-13 NOTE — Telephone Encounter (Signed)
Pt left the office after her prolia and wanted to ask Dr a few questions:  How long should she be taking the prolia injection?   Based on last bone density test, should she discontinue the injections?   Pt would like clarification for herself and also requests we send over the update to Dr. Annie Main as well since she has been having conversations with her regarding these injections.   Ph#  [phone]

## 2021-05-17 ENCOUNTER — Ambulatory Visit: Payer: Medicare HMO | Admitting: Allergy & Immunology

## 2021-05-20 NOTE — Telephone Encounter (Signed)
Spoke with pt regarding her questions/concerns regarding prolia

## 2021-08-02 ENCOUNTER — Ambulatory Visit: Payer: Medicare HMO | Admitting: Allergy & Immunology

## 2021-08-11 NOTE — Progress Notes (Signed)
Cardiology Office Note:    Date:  08/12/2021   ID:  Holly Fields, DOB [birth year], MRN [id number]  PCP:  Lorenda Ishihara, MD  Cardiologist:  Lesleigh Noe, MD   Referring MD: Lorenda Ishihara,*   Chief Complaint  Patient presents with   Hypertension   Hyperlipidemia   Follow-up    CKD    History of Present Illness:    Holly Fields is a 80 y.o. female with a hx of essential HTN , hyperlipidemia, Lymphedema, DM type II, CKD stage III, and prior history of PSVT treated with IV adenacard..  She is doing okay.  No recurrence of SVT.  She is tolerating metoprolol succinate 25 mg/day, quite well.  Past Medical History:  Diagnosis Date   Angio-edema    Arthritis    CKD (chronic kidney disease), stage III (HCC)     saw dr Lowell Guitar 2 years ago, now released from nephrology   Complication of anesthesia 2010   "sluggish after anesthesia and had vertigo", slow to awaken after knee artroscopy 2012   Diabetes (HCC)    Eczema    at times   GERD (gastroesophageal reflux disease)    HTN (hypertension)    Hypercholesteremia    Lymphedema of leg    right more than RIGHT   Narrowing of airway 2010   Neuropathy    Numbness and tingling of right leg    Osteoarthritis    Osteopenia    Osteoporosis    PONV (postoperative nausea and vomiting)    pt has n/v and vertigo after anesthesia   PSVT (paroxysmal supraventricular tachycardia) (HCC)    Recurrent upper respiratory infection (URI)    Sinus tachycardia    Vertigo    at times, cannot turn on left side quickly or sleep on left side   Vitamin D deficiency     Past Surgical History:  Procedure Laterality Date   arthroscopic knee surgery Left 2012   CARDIAC CATHETERIZATION  2012   JOINT REPLACEMENT Right 2010   LUMBAR LAMINECTOMY/DECOMPRESSION MICRODISCECTOMY N/A 10/22/2013   Procedure: MICROLUMBAR DECOMPRESSION LUMBAR TWO TO THREE, LUMBAR THREE TO FOUR and faramenotomy two,threeand four ,five bilateral;  Surgeon:  Javier Docker, MD;  Location: [facility];  Service: Orthopedics;  Laterality: N/A;   NASAL SINUS SURGERY  yrs ago    x 2   TOTAL KNEE ARTHROPLASTY Left 03/01/2015   Procedure: LEFT TOTAL KNEE ARTHROPLASTY;  Surgeon: Ollen Gross, MD;  Location: [facility];  Service: Orthopedics;  Laterality: Left;    Current Medications: Current Meds  Medication Sig   aspirin 81 MG tablet Take 81 mg by mouth daily.   atorvastatin (LIPITOR) 10 MG tablet Take 10 mg by mouth every morning.    Azelastine HCl 0.15 % SOLN Place 2 sprays into both nostrils 2 (two) times daily.   cetirizine (ZYRTEC) 10 MG tablet    Cholecalciferol (VITAMIN D3) 1000 units CAPS Take 1 capsule by mouth 3 (three) times a week.    clindamycin (CLEOCIN) 150 MG capsule Take 150 mg by mouth as needed.   famotidine (PEPCID) 20 MG tablet 1 tablet at bedtime as needed   fexofenadine (ALLEGRA) 180 MG tablet Take 90 mg by mouth daily as needed for allergies. Takes 1/2 tablet   fluticasone (FLONASE) 50 MCG/ACT nasal spray Place 2 sprays into the nose as needed for allergies.   glucose blood (ONETOUCH VERIO) test strip See admin instructions.   guaiFENesin (MUCINEX) 600 MG 12 hr tablet  hydrochlorothiazide (HYDRODIURIL) 25 MG tablet Take 1 tablet (25 mg total) by mouth daily.   JANUVIA 50 MG tablet Take 50 mg by mouth every morning.    lansoprazole (PREVACID) 15 MG capsule Take 15 mg by mouth daily at 12 noon.   metoprolol succinate (TOPROL-XL) 25 MG 24 hr tablet Take 1 tablet (25 mg total) by mouth daily. Take with or immediately following a meal.   Misc Natural Products (NEURIVA PO) Take 1 capsule by mouth daily.   mometasone (NASONEX) 50 MCG/ACT nasal spray Place 2 sprays into the nose daily. Two sprays each in each nostril   NON FORMULARY Nervolink   NONFORMULARY OR COMPOUNDED ITEM Place onto the skin. Topiramate 2.5%, Celecoxib 2%, Gabapentin 6%, and Lidocaine 3%.  Apply 1-2 grams to the affected area 3-4 times daily.   OneTouch Delica  Lancets 33G MISC See admin instructions.   Probiotic Product (ALIGN PO) Take by mouth.   triamcinolone cream (KENALOG) 0.1 %      Allergies:   Adenosine, Augmentin [amoxicillin-pot clavulanate], Codeine, Doxycycline, Fosamax [alendronate], Gabapentin, Hydrocodone-acetaminophen, Hydromorphone hcl, Lactose intolerance (gi), Metformin and related, Other, Oxycodone, Sulfa antibiotics, Sulfamethoxazole-trimethoprim, and Tramadol   Social History   Socioeconomic History   Marital status: Married    Spouse name: Not on file   Number of children: 1   Years of education: Not on file   Highest education level: Not on file  Occupational History    Comment: retired  Tobacco Use   Smoking status: Former    Packs/day: 0.25    Years: 15.00    Pack years: 3.75    Types: Cigarettes    Quit date: 12/04/1976    Years since quitting: 44.7   Smokeless tobacco: Never  Vaping Use   Vaping Use: Never used  Substance and Sexual Activity   Alcohol use: Yes    Comment: very occasional wine   Drug use: No   Sexual activity: Not on file  Other Topics Concern   Not on file  Social History Narrative   Caffeine- 1 drink 3 times per week (usually drinks decaf)   Left handed    Social Determinants of Health   Financial Resource Strain: Not on file  Food Insecurity: Not on file  Transportation Needs: Not on file  Physical Activity: Not on file  Stress: Not on file  Social Connections: Not on file     Family History: The patient's family history includes Allergic rhinitis in her mother; Arrhythmia in her father and mother; Heart attack in her father; Heart failure in her father; Hyperlipidemia in her mother; Hypertension in her mother.  ROS:   Please see the history of present illness.    Has multiple other noncardiac related questions related to her knees, supplements, weight control, etc.  All other systems reviewed and are negative.  EKGs/Labs/Other Studies Reviewed:    The following studies  were reviewed today:  2D Doppler echocardiogram 2018:  Study Conclusions   - Left ventricle: The cavity size was normal. Wall thickness was    normal. Systolic function was vigorous. The estimated ejection    fraction was in the range of 65% to 70%. Wall motion was normal;    there were no regional wall motion abnormalities. Doppler    parameters are consistent with abnormal left ventricular    relaxation (grade 1 diastolic dysfunction). The E/e&' ratio is    between 8-15, suggesting indeterminate LV filling pressure.  - Aortic valve: Trileaflet. Sclerosis without stenosis. There was  no regurgitation.  - Left atrium: The atrium was normal in size.  - Tricuspid valve: There was trivial regurgitation.  - Pulmonary arteries: PA peak pressure: 19 mm Hg (S).  - Inferior vena cava: The vessel was normal in size. The    respirophasic diameter changes were in the normal range (>= 50%),    consistent with normal central venous pressure.   Impressions:   - LVEF 65-70%, normal wall thickness, normal wall motion, grade 1    DD, indeterminate LV filling, normal LA size, trivial TR, RVSP 19    mmHg, normal IVC.  \  EKG:  EKG and EKG is not done today.  Recent Labs: No results found for requested labs within last 8760 hours.  Recent Lipid Panel    Component Value Date/Time   CHOL 149 09/20/2018 0000   TRIG 146 09/20/2018 0000   HDL 51 09/20/2018 0000   LDLCALC 68 09/20/2018 0000    Physical Exam:    VS:  BP 114/72   Pulse 70   Ht 5\' 2"  (1.575 m)   Wt 222 lb (100.7 kg)   SpO2 99%   BMI 40.60 kg/m     Wt Readings from Last 3 Encounters:  08/12/21 222 lb (100.7 kg)  05/10/21 218 lb 9.6 oz (99.2 kg)  04/12/21 217 lb 12.8 oz (98.8 kg)     GEN: Obese. No acute distress HEENT: Normal NECK: No JVD. LYMPHATICS: No lymphadenopathy CARDIAC: No murmur. RRR no gallop, or edema. VASCULAR:  Normal Pulses. No bruits. RESPIRATORY:  Clear to auscultation without rales, wheezing or  rhonchi  ABDOMEN: Soft, non-tender, non-distended, No pulsatile mass, MUSCULOSKELETAL: No deformity  SKIN: Warm and dry NEUROLOGIC:  Alert and oriented x 3 PSYCHIATRIC:  Normal affect   ASSESSMENT:    1. PSVT (paroxysmal supraventricular tachycardia) (HCC)   2. Hyperlipidemia LDL goal <70   3. Essential hypertension   4. Stage 3b chronic kidney disease (HCC)    PLAN:    In order of problems listed above:  No recurrence of PSVT on low-dose beta-blocker.  Symptomatic follow-up or in 1 year.  No changes recommended. The most recent LDL from November 2021 was less than 70.  Continue current management with low-dose atorvastatin.   Excellent blood pressure control on current regimen of HCTZ, Toprol-XL, and salt restriction. Kidney function needs to be followed longitudinally.  This is being done by Dr. December 2021.   Clinical observation.  Call if recurrent PSVT.  No change in current medical regimen.  Continue low-dose beta-blocker suppressive therapy.   Medication Adjustments/Labs and Tests Ordered: Current medicines are reviewed at length with the patient today.  Concerns regarding medicines are outlined above.  No orders of the defined types were placed in this encounter.  No orders of the defined types were placed in this encounter.   There are no Patient Instructions on file for this visit.   Signed, Tenny Craw, MD  08/12/2021 11:47 AM    [facility]

## 2021-08-12 ENCOUNTER — Other Ambulatory Visit: Payer: Self-pay

## 2021-08-12 ENCOUNTER — Ambulatory Visit (INDEPENDENT_AMBULATORY_CARE_PROVIDER_SITE_OTHER): Payer: Medicare HMO | Admitting: Interventional Cardiology

## 2021-08-12 ENCOUNTER — Encounter: Payer: Self-pay | Admitting: Interventional Cardiology

## 2021-08-12 VITALS — BP 114/72 | HR 70 | Ht 62.0 in | Wt 222.0 lb

## 2021-08-12 DIAGNOSIS — I471 Supraventricular tachycardia: Secondary | ICD-10-CM | POA: Diagnosis not present

## 2021-08-12 DIAGNOSIS — I10 Essential (primary) hypertension: Secondary | ICD-10-CM | POA: Diagnosis not present

## 2021-08-12 DIAGNOSIS — E785 Hyperlipidemia, unspecified: Secondary | ICD-10-CM | POA: Diagnosis not present

## 2021-08-12 DIAGNOSIS — N1832 Chronic kidney disease, stage 3b: Secondary | ICD-10-CM | POA: Diagnosis not present

## 2021-08-12 NOTE — Patient Instructions (Signed)

## 2021-10-10 ENCOUNTER — Encounter: Payer: Self-pay | Admitting: Neurology

## 2021-10-10 ENCOUNTER — Ambulatory Visit (INDEPENDENT_AMBULATORY_CARE_PROVIDER_SITE_OTHER): Payer: Medicare HMO | Admitting: Neurology

## 2021-10-10 VITALS — BP 131/75 | HR 75 | Ht 62.0 in | Wt 218.4 lb

## 2021-10-10 DIAGNOSIS — E1169 Type 2 diabetes mellitus with other specified complication: Secondary | ICD-10-CM | POA: Diagnosis not present

## 2021-10-10 DIAGNOSIS — G629 Polyneuropathy, unspecified: Secondary | ICD-10-CM

## 2021-10-10 DIAGNOSIS — R269 Unspecified abnormalities of gait and mobility: Secondary | ICD-10-CM

## 2021-10-10 DIAGNOSIS — I890 Lymphedema, not elsewhere classified: Secondary | ICD-10-CM | POA: Diagnosis not present

## 2021-10-10 DIAGNOSIS — M79605 Pain in left leg: Secondary | ICD-10-CM

## 2021-10-10 DIAGNOSIS — M79604 Pain in right leg: Secondary | ICD-10-CM | POA: Diagnosis not present

## 2021-10-10 MED ORDER — PREGABALIN 50 MG PO CAPS: 50.0000 mg | ORAL_CAPSULE | Freq: Every evening | ORAL | 3 refills | Status: DC

## 2021-10-10 NOTE — Patient Instructions (Signed)
Bloodwork today Will start a low dose of Lyrica, stop for side effects  Pregabalin Capsules What is this medication? PREGABALIN (pre GAB a lin) treats nerve pain. It may also be used to prevent and control seizures in people with epilepsy. It works by calming overactive nerves in your body. This medicine may be used for other purposes; ask your health care provider or pharmacist if you have questions. COMMON BRAND NAME(S): Lyrica What should I tell my care team before I take this medication? They need to know if you have any of these conditions: Drug abuse or addiction Heart failure Kidney disease Lung disease Suicidal thoughts, plans or attempt An unusual or allergic reaction to pregabalin, other medications, foods, dyes, or preservatives Pregnant or trying to get pregnant Breast-feeding How should I use this medication? Take this medication by mouth with water. Take it as directed on the prescription label at the same time every day. You can take it with or without food. If it upsets your stomach, take it with food. Keep taking it unless your care team tells you to stop. A special MedGuide will be given to you by the pharmacist with each prescription and refill. Be sure to read this information carefully each time. Talk to your care team about the use of this medication in children. While it may be prescribed for children as young as 1 month for selected conditions, precautions do apply. Overdosage: If you think you have taken too much of this medicine contact a poison control center or emergency room at once. NOTE: This medicine is only for you. Do not share this medicine with others. What if I miss a dose? If you miss a dose, take it as soon as you can. If it is almost time for your next dose, take only that dose. Do not take double or extra doses. What may interact with this medication? Alcohol Antihistamines for allergy, cough, and cold Certain medications for anxiety or  sleep Certain medications for blood pressure, heart disease Certain medications for depression like amitriptyline, fluoxetine, sertraline Certain medications for diabetes, like pioglitazone, rosiglitazone Certain medications for seizures like phenobarbital, primidone General anesthetics like halothane, isoflurane, methoxyflurane, propofol Medications that relax muscles for surgery Narcotic medications for pain Phenothiazines like chlorpromazine, mesoridazine, prochlorperazine, thioridazine This list may not describe all possible interactions. Give your health care provider a list of all the medicines, herbs, non-prescription drugs, or dietary supplements you use. Also tell them if you smoke, drink alcohol, or use illegal drugs. Some items may interact with your medicine. What should I watch for while using this medication? Visit your care team for regular checks on your progress. Tell your care team if your symptoms do not start to get better or if they get worse. Do not suddenly stop taking this medication. You may develop a severe reaction. Your care team will tell you how much medication to take. If your care team wants you to stop the medication, the dose may be slowly lowered over time to avoid any side effects. You may get drowsy or dizzy. Do not drive, use machinery, or do anything that needs mental alertness until you know how this medication affects you. Do not stand up or sit up quickly, especially if you are an older patient. This reduces the risk of dizzy or fainting spells. Alcohol may interfere with the effect of this medication. Avoid alcoholic drinks. If you or your family notice any changes in your behavior, such as new or worsening depression, thoughts of  harming yourself, anxiety, other unusual or disturbing thoughts, or memory loss, call your care team right away. Wear a medical ID bracelet or chain if you are taking this medication for seizures. Carry a card that describes your  condition. List the medications and doses you take on the card. This medication may make it more difficult to father a child. Talk to your care team if you are concerned about your fertility. What side effects may I notice from receiving this medication? Side effects that you should report to your care team as soon as possible: Allergic reactions or angioedema--skin rash, itching, hives, swelling of the face, eyes, lips, tongue, arms, or legs, trouble swallowing or breathing Blurry vision Thoughts of suicide or self-harm, worsening mood, feelings of depression Trouble breathing Side effects that usually do not require medical attention (report to your care team if they continue or are bothersome): Dizziness Drowsiness Dry mouth Nausea Swelling of the ankles, feet, hands Vomiting Weight gain This list may not describe all possible side effects. Call your doctor for medical advice about side effects. You may report side effects to FDA at 1-800-FDA-1088. Where should I keep my medication? Keep out of the reach of children and pets. This medication can be abused. Keep it in a safe place to protect it from theft. Do not share it with anyone. It is only for you. Selling or giving away this medication is dangerous and against the law. Store at [employer] [address] (77 degrees F). Get rid of any unused medication after the expiration date. This medication may cause harm and death if it is taken by other adults, children, or pets. It is important to get rid of the medication as soon as you no longer need it, or it is expired. You can do this in two ways: Take the medication to a medication take-back program. Check with your pharmacy or law enforcement to find a location. If you cannot return the medication, check the label or package insert to see if the medication should be thrown out in the garbage or flushed down the toilet. If you are not sure, ask your care team. If it is safe to put it in the trash, take  the medication out of the container. Mix the medication with cat litter, dirt, coffee grounds, or other unwanted substance. Seal the mixture in a bag or container. Put it in the trash. NOTE: This sheet is a summary. It may not cover all possible information. If you have questions about this medicine, talk to your doctor, pharmacist, or health care provider.  2022 Elsevier/Gold Standard (2020-11-24 00:00:00)

## 2021-10-10 NOTE — Progress Notes (Signed)
[facility]    Provider:  Dr Jaynee Eagles Requesting Provider: Leeroy Cha,* Primary Care Provider:  Leeroy Cha, MD  CC:  Nerve pain  HPI:  Holly Fields is a 80 y.o. female here as requested by Leeroy Cha,* for neuropathy. PMHx DM2, lumbar spinal stenosis, neuropathy, lymphedema in the legs, CKD.   Patient is here again for nerve pain again and unfortunately not much we may be able to do for her. We evaluated her in the past for this in 09/2020 when she stated she had been "everywhere" for this. 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 She has worsening lymphedema in the legs/swelling. She has stinging with the swelling, if she did not have her stockings on in the morning she could not walk, it has moved up from her feet to her thighs, aching and pain and her feet feel freeing cold. Years ago she went to vascular, she says her feet actually are cold to the touch, she went to vascular and they said it's lymphedema, they feel cold to the touch, recommend maybe trying to get evaluated for circulatory problems. She has more sciatic pain from her back to her butt to her hip. She hsa a leg brace and was able to tolerate it and it eased up. Her walking is off balance due to er leg pain and swelling and knee pain. She has been to lymphedema clinic. She has numbness, tingling, burning and tightness and numbness when the swelling is bad. Last HgbA1c around 7. Stockings help the pain in the feet. Otherwise is excruciating. No other significant low back pain. Worst at night, aching, throbbing and in the early morning to the upper legs.   09/2020: She is here with ehr husband. She has pain in her feet due to neuropathy, arthritis, lymphedema, spinal  stenosis, she has been "everywhere" and had lymphedema therapy, pain management, she is here with ehr husband who also provides information. She has burning, stinging keeping her awake at night, she has had 2 knee replacements and it "settles" in my knees but the pain and stiffness occurs in her knees and makes her imbalanced. She has imbalance due to her knees, her lymphedema. She has been to her knee doctor. She has her regular check ups and she sees Dr. Tamala Julian. She has started Nerve Renew and she is feeling so much better. She used to wake up 230am with burning and aching, she would get up, nothing was comfortable and sleep deprived. Where she is now is the Lymphedema goes in cycles. She has excruciating pain in her legs with the lymphedema, putting socks on helps, during the day she does well in terms of burning pain. Ongoing for years and progressive. She will take a $Remo'650mg'kDazM$  Tylenol and that helps. She feels she has a very high pain tolerance. She has had daily pain for many years. She wakes with numb hands, worse at night. No other focal neurologic deficits, associated symptoms, inciting events or modifiable factors.  Meds tried: tramadol, hydrocodone, Gabapentin (reaction),   Reviewed notes, labs and imaging from outside physicians, which showed:  Labs 03/31/2020: cbc unremarkable, cmp BUN 23 creat 1.60, hgba1c 6.7  Reviewed MRI lumbar spine images and agree with the following: IMPRESSION: 1. Postoperative changes at L2-3 and L3-4 with wide decompressive laminectomies. 2. Moderate-sized disc extrusion at L3-4 as discussed above.  There is mild mass effect on the ventral thecal sac and bilateral lateral recess encroachment. Mild bilateral foraminal encroachment also. 3. Multifactorial mild spinal and moderate bilateral lateral recess stenosis at L4-5. There is also bilateral foraminal stenosis.  Review of Systems: Patient complains of symptoms per HPI as well as the following symptoms: leg pain .  Pertinent negatives and positives per HPI. All others negative    Social History   Socioeconomic History   Marital status: Married    Spouse name: Not on file   Number of children: 1   Years of education: Not on file   Highest education level: Not on file  Occupational History    Comment: retired  Tobacco Use   Smoking status: Former    Packs/day: 0.25    Years: 15.00    Pack years: 3.75    Types: Cigarettes    Quit date: 12/04/1976    Years since quitting: 44.8   Smokeless tobacco: Never  Vaping Use   Vaping Use: Never used  Substance and Sexual Activity   Alcohol use: Yes    Comment: very occasional wine   Drug use: No   Sexual activity: Not on file  Other Topics Concern   Not on file  Social History Narrative   Caffeine- 1 drink 3 times per week (usually drinks decaf)   Left handed    Social Determinants of Health   Financial Resource Strain: Not on file  Food Insecurity: Not on file  Transportation Needs: Not on file  Physical Activity: Not on file  Stress: Not on file  Social Connections: Not on file  Intimate Partner Violence: Not on file    Family History  Problem Relation Age of Onset   Arrhythmia Mother    Hyperlipidemia Mother    Hypertension Mother    Allergic rhinitis Mother    Arrhythmia Father    Heart attack Father    Heart failure Father     Past Medical History:  Diagnosis Date   Angio-edema    Arthritis    CKD (chronic kidney disease), stage III ([place])     saw dr Florene Glen 2 years ago, now released from nephrology   Complication of anesthesia 2010   "sluggish after anesthesia and had vertigo", slow to awaken after knee artroscopy 2012   Diabetes ([place])    Eczema    at times   GERD (gastroesophageal reflux disease)    HTN (hypertension)    Hypercholesteremia    Lymphedema of leg    right more than RIGHT   Narrowing of airway 2010   Neuropathy    Numbness and tingling of right leg    Osteoarthritis    Osteopenia    Osteoporosis     PONV (postoperative nausea and vomiting)    pt has n/v and vertigo after anesthesia   PSVT (paroxysmal supraventricular tachycardia) (HCC)    Recurrent upper respiratory infection (URI)    Sinus tachycardia    Vertigo    at times, cannot turn on left side quickly or sleep on left side   Vitamin D deficiency     Patient Active Problem List   Diagnosis Date Noted   Seasonal and perennial allergic rhinitis 05/10/2021   Dry skin 05/10/2021   Lymphedema 11/06/2017   PSVT (paroxysmal supraventricular tachycardia) (HCC)    Sinus tachycardia    HTN (hypertension)    Osteoporosis without current pathological fracture 04/13/2017   OA (osteoarthritis) of knee 03/01/2015   Preoperative cardiovascular examination 01/05/2015  Spinal stenosis of lumbar region 10/22/2013    Past Surgical History:  Procedure Laterality Date   arthroscopic knee surgery Left 2012   CARDIAC CATHETERIZATION  2012   JOINT REPLACEMENT Right 2010   LUMBAR LAMINECTOMY/DECOMPRESSION MICRODISCECTOMY N/A 10/22/2013   Procedure: MICROLUMBAR DECOMPRESSION LUMBAR TWO TO THREE, LUMBAR THREE TO FOUR and faramenotomy two,threeand four ,five bilateral;  Surgeon: Johnn Hai, MD;  Location: [facility];  Service: Orthopedics;  Laterality: N/A;   NASAL SINUS SURGERY  yrs ago    x 2   TOTAL KNEE ARTHROPLASTY Left 03/01/2015   Procedure: LEFT TOTAL KNEE ARTHROPLASTY;  Surgeon: Gaynelle Arabian, MD;  Location: [facility];  Service: Orthopedics;  Laterality: Left;    Current Outpatient Medications  Medication Sig Dispense Refill   aspirin 81 MG tablet Take 81 mg by mouth daily.     atorvastatin (LIPITOR) 10 MG tablet Take 10 mg by mouth every morning.      Azelastine HCl 0.15 % SOLN Place 2 sprays into both nostrils 2 (two) times daily. 30 mL 5   cetirizine (ZYRTEC) 10 MG tablet      Cholecalciferol (VITAMIN D3) 1000 units CAPS Take 1 capsule by mouth 3 (three) times a week.      clindamycin (CLEOCIN) 150 MG capsule Take 150 mg by  mouth as needed.     famotidine (PEPCID) 20 MG tablet 1 tablet at bedtime as needed     fexofenadine (ALLEGRA) 180 MG tablet Take 90 mg by mouth daily as needed for allergies. Takes 1/2 tablet     fluticasone (FLONASE) 50 MCG/ACT nasal spray Place 2 sprays into the nose as needed for allergies.     glucose blood (ONETOUCH VERIO) test strip See admin instructions.     guaiFENesin (MUCINEX) 600 MG 12 hr tablet      hydrochlorothiazide (HYDRODIURIL) 25 MG tablet Take 1 tablet (25 mg total) by mouth daily. 90 tablet 3   JANUVIA 50 MG tablet Take 50 mg by mouth every morning.      lansoprazole (PREVACID) 15 MG capsule Take 15 mg by mouth daily at 12 noon.     levocetirizine (XYZAL) 5 MG tablet Take 1 tablet (5 mg total) by mouth every evening. 30 tablet 5   metoprolol succinate (TOPROL-XL) 25 MG 24 hr tablet Take 1 tablet (25 mg total) by mouth daily. Take with or immediately following a meal. 90 tablet 3   Misc Natural Products (NEURIVA PO) Take 1 capsule by mouth daily.     mometasone (NASONEX) 50 MCG/ACT nasal spray Place 2 sprays into the nose daily. Two sprays each in each nostril 17 g 5   NON FORMULARY N[employer]ink     NONFORMULARY OR COMPOUNDED ITEM Place onto the skin. Topiramate 2.5%, Celecoxib 2%, Gabapentin 6%, and Lidocaine 3%.  Apply 1-2 grams to the affected area 3-4 times daily.     OneTouch Delica Lancets 16R MISC See admin instructions.     pregabalin (LYRICA) 50 MG capsule Take 1 capsule (50 mg total) by mouth at bedtime. 30 capsule 3   Probiotic Product (ALIGN PO) Take by mouth.     Simethicone 125 MG CAPS      triamcinolone cream (KENALOG) 0.1 %   3   No current facility-administered medications for this visit.    Allergies as of 10/10/2021 - Review Complete 10/10/2021  Allergen Reaction Noted   Adenosine  09/18/2013   Augmentin [amoxicillin-pot clavulanate] Hives 09/18/2013   Codeine Nausea And Vomiting 09/18/2013   Doxycycline Hives  09/18/2013   Fosamax [alendronate]  Swelling 09/02/2020   Gabapentin Other (See Comments) 09/02/2020   Hydrocodone-acetaminophen  09/02/2020   Hydromorphone hcl Nausea And Vomiting 10/03/2017   Lactose intolerance (gi)  01/27/2021   Metformin and related  09/18/2013   Other Other (See Comments) 03/16/2021   Oxycodone Nausea And Vomiting 10/14/2013   Sulfa antibiotics  07/03/2020   Sulfamethoxazole-trimethoprim Other (See Comments) 03/16/2021   Tramadol Nausea And Vomiting 09/18/2013    Vitals: BP 131/75   Pulse 75   Ht _0  (1.575 m)   Wt 218 lb 6.4 oz (99.1 kg)   BMI 39.95 kg/m  Last Weight:  Wt Readings from Last 1 Encounters:  10/10/21 218 lb 6.4 oz (99.1 kg)   Last Height:   Ht Readings from Last 1 Encounters:  10/10/21 _1  (1.575 m)      Physical exam: Exam: Gen: NAD, conversant, well nourised, obese, well groomed                     CV: RRR, no MRG. No Carotid Bruits. + distal edema to the knees, lymphedema warm, nontender Eyes: Conjunctivae clear without exudates or hemorrhage  Neuro: Detailed Neurologic Exam  Speech:    Speech is normal; fluent and spontaneous with normal comprehension.  Cognition:    The patient is oriented to person, place, and time;     recent and remote memory intact;     language fluent;     normal attention, concentration,     fund of knowledge Cranial Nerves:    The pupils are equal, round, and reactive to light.  Visual fields are full to finger confrontation. Extraocular movements are intact. Trigeminal sensation is intact and the muscles of mastication are normal. The face is symmetric. The palate elevates in the midline. Hearing intact. Voice is normal. Shoulder shrug is normal. The tongue midline  Coordination:    No dysmetria or ataxia   Gait:    Wide based with a cane  Motor Observation:    No asymmetry, no atrophy, and no involuntary movements noted. Tone:    Normal muscle tone.    Posture:    Posture is normal. normal erect    Strength:  dififculty due to pain but no focal weakness noted, antigravity in all limbs.        Sensation: intact to LT, pin prick     Reflex Exam:  DTR's: Absent Lowers,  Absent lowers, +2 biceps.    Toes:    The toes are equic bilaterally.   Clonus:    Clonus is absent.    Assessment/Plan:  Patient with pain in her feet/legs and imbalance. Likely multifactorial including significant edema/lymphedema, possibly some mild diabetic neuropathy, lumbar arthritis and hx of lumbar degenerative disease and spinal stenosis s/p decompression. We have seen her in the past and unclear if neurology has much we can do for this unfortunaty very lovely patient.   - Patient's biggest problem as far as pain goes is the pain from the significant edema/lymphedema and not likely peripheral nerve damage(maybe she has a little mild diabetic neuropathy but sensiry exam is quite good). We will check for some serum labs like B12, and she does have diabetes but her sensory exam is intact to pinprick.  - Aquatherapy at [facility] for gait abnormality, difficulty walking, extensive edema/lymphedema - topical compounded medication with topical gabapentin did not help. - She had a side effect of gabapentin anxiety, will try Lyrica which may not affect her  that way, can slowly increase, stop immediately for side effects - We did discuss cymbalta, The other things would be topical such as lidocaine/capsaicin but she says she has tried eveything, pain management can compound topicals as well. She can continue Tylenol with the lyrica at bedtime - In the hands could be CTS, she declined emg/ncs again, discussed conservative measures - May consider vascular and vein consult again as per Dr. Fara Olden sees clinically appropriate.  Orders Placed This Encounter  Procedures   B12 and Folate Panel   Methylmalonic acid, serum   Vitamin B1   Vitamin B6   Heavy metals, blood   Multiple Myeloma Panel (SPEP&IFE w/QIG)   Ambulatory  referral to Physical Therapy   Meds ordered this encounter  Medications   pregabalin (LYRICA) 50 MG capsule    Sig: Take 1 capsule (50 mg total) by mouth at bedtime.    Dispense:  30 capsule    Refill:  3     Cc: Varadarajan, Alison Murray, MD  Sarina Ill, MD  [facility] [address] [place], [place] [address]  Phone [phone] Fax [phone]  I spent 0ver 40  minutes of face-to-face and non-face-to-face time with patient on the  1. Lymphedema   2. Gait abnormality   3. Pain in both lower extremities   4. Type 2 diabetes mellitus with other specified complication, unspecified whether long term insulin use ([place])   5. Polyneuropathy     diagnosis.  This included previsit chart review, lab review, study review, order entry, electronic health record documentation, patient education on the different diagnostic and therapeutic options, counseling and coordination of care, risks and benefits of management, compliance, or risk factor reduction

## 2021-10-11 NOTE — Telephone Encounter (Signed)
Pt ready for scheduling on or after 11/13/21  Out-of-pocket cost due at time of visit: $0.00  Primary: [employer] [place] co-insurance: 0% Admin fee co-insurance: 0%   Secondary: n/a [place] co-insurance:  Admin fee co-insurance:   Deductible: does not apply  Prior Auth: APPROVED PA# [id number] Valid: 04/02/21-04/02/22    ** This summary of benefits is an estimation of the patient's out-of-pocket cost. Exact cost may very based on individual plan coverage.

## 2021-10-13 ENCOUNTER — Telehealth: Payer: Self-pay | Admitting: *Deleted

## 2021-10-13 NOTE — Telephone Encounter (Signed)
  Spoke with patient and discussed lab results.  Patient stated she had seafood within the last couple of weeks.  She is aware if the remaining labs are abnormal we will give her a call.  She has not been able to fill the Pregabalin yet.  I advised we would do a PA. Pregabalin PA done.   Anson Fret, MD  10/13/2021  4:59 PM EST Back to Top    Blood work looks fine. Arsenic is slightly elevated which can happen if you eat seafood so I'm not that concerned with a slight elevation (lead and mercury were normal)

## 2021-10-13 NOTE — Telephone Encounter (Signed)
Completed pregabalin PA on cover my meds.  Was approved  07/15/2021 - 10/13/2022.  Patient made aware and I encouraged her to give the pharmacy a call.  Also faxed approval letter to patient's pharmacy.

## 2021-10-17 LAB — METHYLMALONIC ACID, SERUM: Methylmalonic Acid: 178 nmol/L (ref 0–378)

## 2021-10-17 LAB — MULTIPLE MYELOMA PANEL, SERUM
Albumin SerPl Elph-Mcnc: 3.7 g/dL (ref 2.9–4.4)
Albumin/Glob SerPl: 1.3 (ref 0.7–1.7)
Alpha 1: 0.2 g/dL (ref 0.0–0.4)
Alpha2 Glob SerPl Elph-Mcnc: 0.7 g/dL (ref 0.4–1.0)
B-Globulin SerPl Elph-Mcnc: 1.2 g/dL (ref 0.7–1.3)
Gamma Glob SerPl Elph-Mcnc: 0.8 g/dL (ref 0.4–1.8)
Globulin, Total: 2.9 g/dL (ref 2.2–3.9)
IgA/Immunoglobulin A, Serum: 291 mg/dL (ref 64–422)
IgG (Immunoglobin G), Serum: 1043 mg/dL (ref 586–1602)
IgM (Immunoglobulin M), Srm: 51 mg/dL (ref 26–217)
Total Protein: 6.6 g/dL (ref 6.0–8.5)

## 2021-10-17 LAB — B12 AND FOLATE PANEL
Folate: 9.6 ng/mL (ref >3.0)
Vitamin B-12: 1647 pg/mL — ABNORMAL HIGH (ref 232–1245)

## 2021-10-17 LAB — VITAMIN B1: Thiamine: 142.8 nmol/L (ref 66.5–200.0)

## 2021-10-17 LAB — HEAVY METALS, BLOOD
Arsenic: 14 ug/L — ABNORMAL HIGH (ref 0–9)
Lead, Blood: <1 ug/dL (ref 0.0–3.4)
Mercury: 1.1 ug/L (ref 0.0–14.9)

## 2021-10-17 LAB — VITAMIN B6: Vitamin B6: 5 ug/L (ref 3.4–65.2)

## 2021-10-25 ENCOUNTER — Telehealth: Payer: Self-pay | Admitting: Neurology

## 2021-10-25 NOTE — Telephone Encounter (Signed)
Pt called wanting to inform the provider that the pregabalin (LYRICA) 50 MG capsule is working very well for her and she is able to sleep to the morning. Pt is very appreciative for provider's help. Pt would also like to inform provider that she will be starting Water therapy early December.

## 2021-11-04 ENCOUNTER — Other Ambulatory Visit: Payer: Self-pay | Admitting: Allergy & Immunology

## 2021-11-08 ENCOUNTER — Ambulatory Visit: Payer: Medicare HMO | Admitting: Internal Medicine

## 2021-11-09 ENCOUNTER — Ambulatory Visit (HOSPITAL_BASED_OUTPATIENT_CLINIC_OR_DEPARTMENT_OTHER): Payer: Medicare HMO | Attending: Neurology | Admitting: Physical Therapy

## 2021-11-09 ENCOUNTER — Encounter (HOSPITAL_BASED_OUTPATIENT_CLINIC_OR_DEPARTMENT_OTHER): Payer: Self-pay | Admitting: Physical Therapy

## 2021-11-09 ENCOUNTER — Other Ambulatory Visit: Payer: Self-pay

## 2021-11-09 DIAGNOSIS — I890 Lymphedema, not elsewhere classified: Secondary | ICD-10-CM | POA: Diagnosis not present

## 2021-11-09 DIAGNOSIS — M25562 Pain in left knee: Secondary | ICD-10-CM | POA: Diagnosis present

## 2021-11-09 DIAGNOSIS — M79605 Pain in left leg: Secondary | ICD-10-CM | POA: Insufficient documentation

## 2021-11-09 DIAGNOSIS — R269 Unspecified abnormalities of gait and mobility: Secondary | ICD-10-CM | POA: Insufficient documentation

## 2021-11-09 DIAGNOSIS — G8929 Other chronic pain: Secondary | ICD-10-CM | POA: Diagnosis present

## 2021-11-09 DIAGNOSIS — G629 Polyneuropathy, unspecified: Secondary | ICD-10-CM | POA: Insufficient documentation

## 2021-11-09 DIAGNOSIS — M6281 Muscle weakness (generalized): Secondary | ICD-10-CM | POA: Diagnosis present

## 2021-11-09 DIAGNOSIS — M25561 Pain in right knee: Secondary | ICD-10-CM | POA: Insufficient documentation

## 2021-11-09 DIAGNOSIS — M79604 Pain in right leg: Secondary | ICD-10-CM | POA: Diagnosis not present

## 2021-11-09 DIAGNOSIS — R262 Difficulty in walking, not elsewhere classified: Secondary | ICD-10-CM | POA: Insufficient documentation

## 2021-11-09 NOTE — Therapy (Signed)
OUTPATIENT PHYSICAL THERAPY LOWER EXTREMITY EVALUATION   Patient Name: Holly Fields MRN: [id number] DOB:06/26/1941, 80 y.o., female Today's Date: 11/09/2021   PT End of Session - 11/09/21 1115     Visit Number 1    PT Start Time 1107    PT Stop Time 1155    PT Time Calculation (min) 48 min    Activity Tolerance Patient tolerated treatment well    Behavior During Therapy WFL for tasks assessed/performed             Past Medical History:  Diagnosis Date   Angio-edema    Arthritis    CKD (chronic kidney disease), stage III (HCC)     saw dr Lowell Guitarpowell 2 years ago, now released from nephrology   Complication of anesthesia 2010   "sluggish after anesthesia and had vertigo", slow to awaken after knee artroscopy 2012   Diabetes (HCC)    Eczema    at times   GERD (gastroesophageal reflux disease)    HTN (hypertension)    Hypercholesteremia    Lymphedema of leg    right more than RIGHT   Narrowing of airway 2010   Neuropathy    Numbness and tingling of right leg    Osteoarthritis    Osteopenia    Osteoporosis    PONV (postoperative nausea and vomiting)    pt has n/v and vertigo after anesthesia   PSVT (paroxysmal supraventricular tachycardia) (HCC)    Recurrent upper respiratory infection (URI)    Sinus tachycardia    Vertigo    at times, cannot turn on left side quickly or sleep on left side   Vitamin D deficiency    Past Surgical History:  Procedure Laterality Date   arthroscopic knee surgery Left 2012   CARDIAC CATHETERIZATION  2012   JOINT REPLACEMENT Right 2010   LUMBAR LAMINECTOMY/DECOMPRESSION MICRODISCECTOMY N/A 10/22/2013   Procedure: MICROLUMBAR DECOMPRESSION LUMBAR TWO TO THREE, LUMBAR THREE TO FOUR and faramenotomy two,threeand four ,five bilateral;  Surgeon: Javier DockerJeffrey C Beane, MD;  Location: [facility];  Service: Orthopedics;  Laterality: N/A;   NASAL SINUS SURGERY  yrs ago    x 2   TOTAL KNEE ARTHROPLASTY Left 03/01/2015   Procedure: LEFT TOTAL KNEE  ARTHROPLASTY;  Surgeon: Ollen GrossFrank Aluisio, MD;  Location: [facility];  Service: Orthopedics;  Laterality: Left;   Patient Active Problem List   Diagnosis Date Noted   Seasonal and perennial allergic rhinitis 05/10/2021   Dry skin 05/10/2021   Lymphedema 11/06/2017   PSVT (paroxysmal supraventricular tachycardia) (HCC)    Sinus tachycardia    HTN (hypertension)    Osteoporosis without current pathological fracture 04/13/2017   OA (osteoarthritis) of knee 03/01/2015   Preoperative cardiovascular examination 01/05/2015   Spinal stenosis of lumbar region 10/22/2013    PCP: Lorenda IshiharaVaradarajan, Rupashree, MD  REFERRING PROVIDER: Anson FretAhern, Antonia B, MD  REFERRING DIAG: G62.9 (ICD-10-CM) - Polyneuropathy I89.0 (ICD-10-CM) - Lymphedema R26.9 (ICD-10-CM) - Gait abnormality M79.604,M79.605 (ICD-10-CM) - Pain in both lower extremities   THERAPY DIAG:  Chronic pain of right knee  Chronic pain of left knee  Muscle weakness (generalized)  Difficulty in walking, not elsewhere classified  ONSET DATE: Chronic / MD order/visit 10/10/2021  SUBJECTIVE:   SUBJECTIVE STATEMENT: -Pt began having pain in her legs approx 15 years ago.  Pt has received 3 rounds of lymphedema therapy over the years.  Her lymphadema treatment interfered with her R knee therapy after TKR.  She has tried pain management, chiropractic care, and accupuncture.  Pt saw  neurologist last year who ordered a cream which didn't help. Pt returned to neurologist on 10/10/2021 and was prescribed Lyrica.  Pt states she is feeling much better since starting Lyrica.  She is not having as much nerve pain and numbness at night and is sleeping better.  She continues to have the "arthritis pain" in her knees.  10/10/2021 MD note indicated Pt's pain likely multifactorial including significant edema/lymphedema, possibly some mild diabetic neuropathy, lumbar arthritis and hx of lumbar degenerative disease and spinal stenosis s/p decompression.  MD script indicated  aquatherapy at [facility] for gait abnormality, difficulty walking, and extensive edema/lymphedema  -She has excruciating pain at night and c/o's of numbness in bilat LEs and feet around 2 AM which causes her to get OOB and sleep in the recliner.  Pt states she can't do stairs and has a chair lift in her home to get to 2nd floor.  Pt doesn't use the cane in her home, but uses a cane with community ambulation.  Pt has difficulty with car transfers and stepping up on a curb.  Pt is limited with standing duration and ambulation distance.  Pt states her legs get heavy and cold.  Pt reports she has stinging with the swelling, and is unable to ambulate in AM if she didn't wear compression stockings.  Pt continues to have N/T and burning in bilat LEs.       PERTINENT HISTORY: -Chronic condition; MD script indicated aquatherapy at [facility] for gait abnormality, difficulty walking, and extensive edema/lymphedema. -DM type 2, lumbar spinal stenosis, neuropathy, lymphedema in the legs, chronic kidney disease stage III.  Osteoporosis paroxysmal supraventricular tachycardia (had one occasion and is on meds). -PSHx:  bilat knee replacements R: 2010, L: 2016 and lumbar spinal decompression and laminectomy surgery (2017)     PAIN:  Are you having pain? Yes NRPS scale: 5/10 current and best, 10/10 worst Pain location: Bilat knees > thighs and distal LEs.  Bilat hips. Pain description: constant, burning, tingling, and aching, stinging, throbbing  Aggravating factors: community ambulation, shopping Relieving factors: Lyrica, Tylenol  PRECAUTIONS: Other: chronic condition, lymphadema, neuropathy, osteoporosis  WEIGHT BEARING RESTRICTIONS No  FALLS:  Has patient fallen in last 6 months? No    OCCUPATION: Pt is retired  PLOF: Independent. Pt uses a cane in community ambulation for 1 year.  PATIENT GOALS Improve mobility.  Ambulate without cane.  Improve balance.   OBJECTIVE:   DIAGNOSTIC FINDINGS:  Reviewed MRI lumbar spine images and agree with the following: IMPRESSION: 1. Postoperative changes at L2-3 and L3-4 with wide decompressive laminectomies. 2. Moderate-sized disc extrusion at L3-4 as discussed above. There is mild mass effect on the ventral thecal sac and bilateral lateral recess encroachment. Mild bilateral foraminal encroachment also. 3. Multifactorial mild spinal and moderate bilateral lateral recess stenosis at L4-5. There is also bilateral foraminal stenosis  PATIENT SURVEYS:  FOTO 43  COGNITION:  Overall cognitive status: Within functional limits for tasks assessed     Observation:  Pt has swelling in bilat LEs.   LE MMT:  MMT Right 11/09/2021 Left 11/09/2021  Hip flexion Unable to tolerate resistance 3+/5  Hip extension    Hip abduction Weaker on R Tolerates good resistance in sitting  Hip adduction    Hip internal rotation    Hip external rotation 4+/5 4+/5  Knee flexion 4-/5 4/5  Knee extension 4/5 4/5  Ankle dorsiflexion    Ankle plantarflexion    Ankle inversion    Ankle eversion     (  Blank rows = not tested)    FUNCTIONAL TESTS:  5 times sit to stand: 18 seconds with hands on thighs Timed up and go (TUG): 15 seconds with SPC  GAIT: Assistive device utilized: Single point cane Comments: Limited foot clearance bilat, increased wt thru SPC on L, decreased gait speed, limited pelvic rotation    TODAY'S TREATMENT: See below for pt education.   PATIENT EDUCATION:  Education details: Educated pt in POC and objective findings.  Educated pt in aquatic rehab process and benefits and purpose of aquatic therapy.  Answered Pt's and husband's questions.  Person educated: Patient and Husband Education method: Explanation Education comprehension: verbalized understanding   HOME EXERCISE PROGRAM: Not given.  Pt will be performing aquatic therapy.  ASSESSMENT:  CLINICAL IMPRESSION: Patient is a 80 y.o. female with dx's of Polyneuropathy,  Lymphedema, Gait abnormality, and Pain in both lower extremities presenting to the clinic with bilat knee pain, muscle weakness in LEs, and difficulty in walking.  Pt reports having improved pain at night and improved sleeping since taking the new medication from MD.  This is a chronic condition.  Pt has c/o's of pain, N/T, and burning in bilat LEs.  Pt is limited with functional mobility skills including ambulation and transfers and states she can't do stairs.  Pt ambulates in community with a cane.  She is limited with standing duration and ambulation distance.  Patient should benefit from skilled PT to address above impairments and improve overall function.   Objective impairments include Abnormal gait, decreased activity tolerance, decreased balance, decreased endurance, decreased mobility, difficulty walking, decreased strength, increased edema, impaired flexibility, and pain. These impairments are limiting patient from cleaning, community activity, shopping, and transfers, and ambulation . Personal factors including Time since onset of injury/illness/exacerbation and 3+ comorbidities: DM Type 2, lumbar spinal stenosis with lumbar surgery, Osteoporosis, and bilat TKR.  are also affecting patient's functional outcome.   REHAB POTENTIAL: Good  CLINICAL DECISION MAKING: Evolving/moderate complexity  EVALUATION COMPLEXITY: Moderate   GOALS:  SHORT TERM GOALS:  STG Name Target Date Goal status  1 Pt will tolerate aquatic therapy without adverse effects for improved tolerance to activity and daily mobility.  Baseline:  11/23/2021 INITIAL  2 Pt will report improved balance and stability with ambulation and her daily standing activities.   Baseline:  11/30/2021 INITIAL  3 Pt will report at least a 25% improvement overall in pain and 40% improvement in daily mobility.  Baseline: 12/07/2021 INITIAL                       LONG TERM GOALS:   LTG Name Target Date Goal status  1 Pt will demo  improved strength to 4/5 in bilat hip flexion, 4+/5 in bilat knees, and improved R hip abd to = L LE for improved performance of functional mobility skills.   Baseline: 12/21/2021 INITIAL  2 Pt will be able to perform car transfers without difficulty.   Baseline: 12/21/2021 INITIAL  3 Pt will be able to ambulate community distance without significant pain or difficulty.  Baseline: 12/21/2021 INITIAL  4 Pt will report improved standing tolerance with daily activities and household activities.  Baseline: 12/21/2021 INITIAL  5 Pt will be able to ascend and descend 1-2 steps without significant difficulty for improved ability to ascend/descend curb.  Baseline: 12/21/2021 INITIAL  6 Pt will be independent with aquatic HEP for improved strength, tolerance to activity, and performance of daily mobility Baseline: 12/21/2021 INITIAL  PLAN: PT FREQUENCY: 2x/week  PT DURATION: 6 weeks  PLANNED INTERVENTIONS: Therapeutic exercises, Therapeutic activity, Neuro Muscular re-education, Balance training, Gait training, Patient/Family education, Stair training, Aquatic Therapy, Dry Needling, Electrical stimulation, Cryotherapy, Moist heat, Taping, Ultrasound, and Manual therapy  PLAN FOR NEXT SESSION: Aquatic Therapy   Selinda Michaels III PT, DPT 11/10/21 5:58 PM

## 2021-11-11 ENCOUNTER — Other Ambulatory Visit: Payer: Self-pay

## 2021-11-11 ENCOUNTER — Encounter (HOSPITAL_BASED_OUTPATIENT_CLINIC_OR_DEPARTMENT_OTHER): Payer: Self-pay | Admitting: Physical Therapy

## 2021-11-11 ENCOUNTER — Ambulatory Visit (HOSPITAL_BASED_OUTPATIENT_CLINIC_OR_DEPARTMENT_OTHER): Payer: Medicare HMO | Admitting: Physical Therapy

## 2021-11-11 DIAGNOSIS — M25561 Pain in right knee: Secondary | ICD-10-CM | POA: Diagnosis not present

## 2021-11-11 DIAGNOSIS — G8929 Other chronic pain: Secondary | ICD-10-CM

## 2021-11-11 DIAGNOSIS — M6281 Muscle weakness (generalized): Secondary | ICD-10-CM

## 2021-11-11 DIAGNOSIS — M25562 Pain in left knee: Secondary | ICD-10-CM

## 2021-11-11 DIAGNOSIS — R262 Difficulty in walking, not elsewhere classified: Secondary | ICD-10-CM

## 2021-11-11 NOTE — Therapy (Signed)
OUTPATIENT PHYSICAL THERAPY TREATMENT NOTE   Patient Name: MARRISSA TEZENO MRN: [id number] DOB:May 26, 1941, 80 y.o., female Today's Date: 11/11/2021  PCP: Leeroy Cha, MD REFERRING PROVIDER: Leeroy Cha,*   PT End of Session - 11/11/21 1213     Visit Number 2    Number of Visits 12    Date for PT Re-Evaluation 12/21/21    Authorization Type AETNA    PT Start Time 1202    Activity Tolerance Patient tolerated treatment well    Behavior During Therapy [facility] for tasks assessed/performed             Past Medical History:  Diagnosis Date   Angio-edema    Arthritis    CKD (chronic kidney disease), stage III ([place])     saw dr Florene Glen 2 years ago, now released from nephrology   Complication of anesthesia 2010   "sluggish after anesthesia and had vertigo", slow to awaken after knee artroscopy 2012   Diabetes ([place])    Eczema    at times   GERD (gastroesophageal reflux disease)    HTN (hypertension)    Hypercholesteremia    Lymphedema of leg    right more than RIGHT   Narrowing of airway 2010   Neuropathy    Numbness and tingling of right leg    Osteoarthritis    Osteopenia    Osteoporosis    PONV (postoperative nausea and vomiting)    pt has n/v and vertigo after anesthesia   PSVT (paroxysmal supraventricular tachycardia) ([place])    Recurrent upper respiratory infection (URI)    Sinus tachycardia    Vertigo    at times, cannot turn on left side quickly or sleep on left side   Vitamin D deficiency    Past Surgical History:  Procedure Laterality Date   arthroscopic knee surgery Left 2012   CARDIAC CATHETERIZATION  2012   JOINT REPLACEMENT Right 2010   LUMBAR LAMINECTOMY/DECOMPRESSION MICRODISCECTOMY N/A 10/22/2013   Procedure: MICROLUMBAR DECOMPRESSION LUMBAR TWO TO THREE, LUMBAR THREE TO FOUR and faramenotomy two,threeand four ,five bilateral;  Surgeon: Johnn Hai, MD;  Location: [facility];  Service: Orthopedics;  Laterality: N/A;   NASAL SINUS  SURGERY  yrs ago    x 2   TOTAL KNEE ARTHROPLASTY Left 03/01/2015   Procedure: LEFT TOTAL KNEE ARTHROPLASTY;  Surgeon: Gaynelle Arabian, MD;  Location: [facility];  Service: Orthopedics;  Laterality: Left;   Patient Active Problem List   Diagnosis Date Noted   Seasonal and perennial allergic rhinitis 05/10/2021   Dry skin 05/10/2021   Lymphedema 11/06/2017   PSVT (paroxysmal supraventricular tachycardia) (HCC)    Sinus tachycardia    HTN (hypertension)    Osteoporosis without current pathological fracture 04/13/2017   OA (osteoarthritis) of knee 03/01/2015   Preoperative cardiovascular examination 01/05/2015   Spinal stenosis of lumbar region 10/22/2013    SUBJECTIVE: REFERRING DIAG: G62.9 (ICD-10-CM) - Polyneuropathy I89.0 (ICD-10-CM) - Lymphedema R26.9 (ICD-10-CM) - Gait abnormality M79.604,M79.605 (ICD-10-CM) - Pain in both lower extremities    THERAPY DIAG:  Chronic pain of right knee   Chronic pain of left knee   Muscle weakness (generalized)   Difficulty in walking, not elsewhere classified  ONSET DATE: Chronic / MD order/visit 10/10/2021   SUBJECTIVE:    SUBJECTIVE STATEMENT: "Knees hurting today but OK, I have a high pain tolerance. I am comfortable in water, grew up swimming"     -Pt began having pain in her legs approx 15 years ago.  Pt has received 3 rounds  of lymphedema therapy over the years.  Her lymphadema treatment interfered with her R knee therapy after TKR.  She has tried pain management, chiropractic care, and accupuncture.  Pt saw neurologist last year who ordered a cream which didn't help. Pt returned to neurologist on 10/10/2021 and was prescribed Lyrica.  Pt states she is feeling much better since starting Lyrica.  She is not having as much nerve pain and numbness at night and is sleeping better.  She continues to have the "arthritis pain" in her knees.  10/10/2021 MD note indicated Pt's pain likely multifactorial including significant edema/lymphedema, possibly  some mild diabetic neuropathy, lumbar arthritis and hx of lumbar degenerative disease and spinal stenosis s/p decompression.  MD script indicated aquatherapy at [facility] for gait abnormality, difficulty walking, and extensive edema/lymphedema   -She has excruciating pain at night and c/o's of numbness in bilat LEs and feet around 2 AM which causes her to get OOB and sleep in the recliner.  Pt states she can't do stairs and has a chair lift in her home to get to 2nd floor.  Pt doesn't use the cane in her home, but uses a cane with community ambulation.  Pt has difficulty with car transfers and stepping up on a curb.  Pt is limited with standing duration and ambulation distance.  Pt states her legs get heavy and cold.  Pt reports she has stinging with the swelling, and is unable to ambulate in AM if she didn't wear compression stockings.  Pt continues to have N/T and burning in bilat LEs.            PERTINENT HISTORY: -Chronic condition; MD script indicated aquatherapy at [place] for gait abnormality, difficulty walking, and extensive edema/lymphedema. -DM type 2, lumbar spinal stenosis, neuropathy, lymphedema in the legs, chronic kidney disease stage III.  Osteoporosis paroxysmal supraventricular tachycardia (had one occasion and is on meds). -PSHx:  bilat knee replacements R: 2010, L: 2016 and lumbar spinal decompression and laminectomy surgery (2017)      PAIN:  Are you having pain? Yes NRPS scale: 5/10 current and best, 10/10 worst. 12-9  7/10 Pain location: Bilat knees > thighs and distal LEs.  Bilat hips. Pain description: constant, burning, tingling, and aching, stinging, throbbing  Aggravating factors: community ambulation, shopping Relieving factors: Lyrica, Tylenol   PRECAUTIONS: Other: chronic condition, lymphadema, neuropathy, osteoporosis   WEIGHT BEARING RESTRICTIONS No   FALLS:  Has patient fallen in last 6 months? No       OCCUPATION: Pt is retired   PLOF:  Independent. Pt uses a cane in community ambulation for 1 year.   PATIENT GOALS Improve mobility.  Ambulate without cane.  Improve balance.     OBJECTIVE:    DIAGNOSTIC FINDINGS: Reviewed MRI lumbar spine images and agree with the following: IMPRESSION: 1. Postoperative changes at L2-3 and L3-4 with wide decompressive laminectomies. 2. Moderate-sized disc extrusion at L3-4 as discussed above. There is mild mass effect on the ventral thecal sac and bilateral lateral recess encroachment. Mild bilateral foraminal encroachment also. 3. Multifactorial mild spinal and moderate bilateral lateral recess stenosis at L4-5. There is also bilateral foraminal stenosis   PATIENT SURVEYS:  FOTO 43   COGNITION:          Overall cognitive status: Within functional limits for tasks assessed                        Observation:  Pt has swelling in bilat LEs.     LE MMT:   MMT Right 11/09/2021 Left 11/09/2021  Hip flexion Unable to tolerate resistance 3+/5  Hip extension      Hip abduction Weaker on R Tolerates good resistance in sitting  Hip adduction      Hip internal rotation      Hip external rotation 4+/5 4+/5  Knee flexion 4-/5 4/5  Knee extension 4/5 4/5  Ankle dorsiflexion      Ankle plantarflexion      Ankle inversion      Ankle eversion       (Blank rows = not tested)       FUNCTIONAL TESTS:  5 times sit to stand: 18 seconds with hands on thighs Timed up and go (TUG): 15 seconds with SPC   GAIT: Assistive device utilized: Single point cane Comments: Limited foot clearance bilat, increased wt thru SPC on L, decreased gait speed, limited pelvic rotation       TODAY'S TREATMENT: See below for pt education.     PATIENT EDUCATION:  Education details: Educated pt in POC and objective findings.  Educated pt in aquatic rehab process and benefits and purpose of aquatic therapy.  Answered Pt's and husband's questions.  Person educated: Patient and Husband Education  method: Explanation Education comprehension: verbalized understanding      HOME EXERCISE PROGRAM: [id number] ASSESSMENT:   CLINICAL IMPRESSION: Pt reports comfort in pool setting. She demonstrates confidence and indep.  Intro to setting.  Majority of session spend 75% submerged to benefit lymphedema.  Pt need ue support of wall to complete LE exercises due to some core weakness.  She is directed through stretching and strengthening exercises which she tolerates well.  Some discomfort in bilat knee with knee flex in standing towards pt end range. Pt reporting 7/10 pain entering pool  0/10 prior to exiting pool. She will benefit from aquatic therapy to hasten and facilitate progression towards meeting goals.       Objective impairments include Abnormal gait, decreased activity tolerance, decreased balance, decreased endurance, decreased mobility, difficulty walking, decreased strength, increased edema, impaired flexibility, and pain. These impairments are limiting patient from cleaning, community activity, shopping, and transfers, and ambulation . Personal factors including Time since onset of injury/illness/exacerbation and 3+ comorbidities: DM Type 2, lumbar spinal stenosis with lumbar surgery, Osteoporosis, and bilat TKR.  are also affecting patient's functional outcome.    REHAB POTENTIAL: Good   CLINICAL DECISION MAKING: Evolving/moderate complexity   EVALUATION COMPLEXITY: Moderate     GOALS:   SHORT TERM GOALS:   STG Name Target Date Goal status  1 Pt will tolerate aquatic therapy without adverse effects for improved tolerance to activity and daily mobility.  Baseline:  11/23/2021 INITIAL  2 Pt will report improved balance and stability with ambulation and her daily standing activities.   Baseline:  11/30/2021 INITIAL  3 Pt will report at least a 25% improvement overall in pain and 40% improvement in daily mobility.  Baseline: 12/07/2021 INITIAL                                         LONG TERM GOALS:    LTG Name Target Date Goal status  1 Pt will demo improved strength to 4/5 in bilat hip flexion, 4+/5 in bilat knees, and improved R hip abd to = L LE for improved performance of  functional mobility skills.   Baseline: 12/21/2021 INITIAL  2 Pt will be able to perform car transfers without difficulty.   Baseline: 12/21/2021 INITIAL  3 Pt will be able to ambulate community distance without significant pain or difficulty.  Baseline: 12/21/2021 INITIAL  4 Pt will report improved standing tolerance with daily activities and household activities.  Baseline: 12/21/2021 INITIAL  5 Pt will be able to ascend and descend 1-2 steps without significant difficulty for improved ability to ascend/descend curb.  Baseline: 12/21/2021 INITIAL  6 Pt will be independent with aquatic HEP for improved strength, tolerance to activity, and performance of daily mobility Baseline: 12/21/2021 INITIAL             PLAN: PT FREQUENCY: 2x/week   PT DURATION: 6 weeks   PLANNED INTERVENTIONS: Therapeutic exercises, Therapeutic activity, Neuro Muscular re-education, Balance training, Gait training, Patient/Family education, Stair training, Aquatic Therapy, Dry Needling, Electrical stimulation, Cryotherapy, Moist heat, Taping, Ultrasound, and Manual therapy   PLAN FOR NEXT SESSION: advance strengthening and stretching LE and core    OBJECTIVE:   TODAY'S TREATMENT: 12/9 Pt seen for aquatic therapy today.  Treatment took place in water 3.25-4.8 ft in depth at the [employer] pool. Temp of water was 93 degrees.  Pt entered(backwards)/exited the pool via stairs step to independently with bilat rail.  Introduced pt to setting. EDU on properties of and benefits of water. Walking forward, retro and sidestepping in 3 ft 4 widths each.    Seated stretching: gastroc, hamstrings and adductors 3 x 20-25 second hold  Exercises 75% submerged standing: marching; add/abd; hip extension and hip flex  holding to wall 2 x 10 -gentle simulated jogging x 20 -1 foam hand buoy submerged to hips walking forward and retro x 2 widths; side stepping into knee flex with add/abd of ue x 2 widths Seated: marching, knee flex/ext (kicking), flutter kicking at hips 2x20  Pt requires buoyancy for support and to offload joints with strengthening exercises. Viscosity of the water is needed for resistance of strengthening; water current perturbations provides challenge to standing balance unsupported, requiring increased core activation.    Lema (Frankie) Bryelle Spiewak MPT 11/11/2021, 1:16 PM

## 2021-11-14 ENCOUNTER — Ambulatory Visit (HOSPITAL_BASED_OUTPATIENT_CLINIC_OR_DEPARTMENT_OTHER): Payer: Medicare HMO | Admitting: Physical Therapy

## 2021-11-15 ENCOUNTER — Ambulatory Visit: Payer: Medicare HMO

## 2021-11-18 ENCOUNTER — Ambulatory Visit (HOSPITAL_BASED_OUTPATIENT_CLINIC_OR_DEPARTMENT_OTHER): Payer: Medicare HMO | Admitting: Physical Therapy

## 2021-11-22 ENCOUNTER — Ambulatory Visit (INDEPENDENT_AMBULATORY_CARE_PROVIDER_SITE_OTHER): Payer: Medicare HMO

## 2021-11-22 ENCOUNTER — Other Ambulatory Visit: Payer: Self-pay

## 2021-11-22 DIAGNOSIS — M810 Age-related osteoporosis without current pathological fracture: Secondary | ICD-10-CM

## 2021-11-22 MED ORDER — DENOSUMAB 60 MG/ML ~~LOC~~ SOSY
60.0000 mg | PREFILLED_SYRINGE | Freq: Once | SUBCUTANEOUS | Status: AC
  Administered 2021-11-22: 14:00:00 60 mg via SUBCUTANEOUS

## 2021-11-22 NOTE — Progress Notes (Signed)
Prolia injection administered to pt's left arm. Pt tolerated well. °

## 2021-11-23 ENCOUNTER — Ambulatory Visit (HOSPITAL_BASED_OUTPATIENT_CLINIC_OR_DEPARTMENT_OTHER): Payer: Medicare HMO | Admitting: Physical Therapy

## 2021-11-23 ENCOUNTER — Encounter (HOSPITAL_BASED_OUTPATIENT_CLINIC_OR_DEPARTMENT_OTHER): Payer: Self-pay | Admitting: Physical Therapy

## 2021-11-23 DIAGNOSIS — G8929 Other chronic pain: Secondary | ICD-10-CM

## 2021-11-23 DIAGNOSIS — M6281 Muscle weakness (generalized): Secondary | ICD-10-CM

## 2021-11-23 DIAGNOSIS — M25562 Pain in left knee: Secondary | ICD-10-CM

## 2021-11-23 DIAGNOSIS — M25561 Pain in right knee: Secondary | ICD-10-CM | POA: Diagnosis not present

## 2021-11-23 DIAGNOSIS — R262 Difficulty in walking, not elsewhere classified: Secondary | ICD-10-CM

## 2021-11-23 NOTE — Therapy (Signed)
OUTPATIENT PHYSICAL THERAPY LOWER EXTREMITY EVALUATION   Patient Name: Holly Fields MRN: [id number] DOB:Apr 13, 1941, 80 y.o., female Today's Date: 11/23/2021   PT End of Session - 11/23/21 1450     Visit Number 3    Number of Visits 12    Date for PT Re-Evaluation 12/21/21    Authorization Type AETNA    PT Start Time 1400    PT Stop Time [id number]    PT Time Calculation (min) 33 min    Activity Tolerance Patient tolerated treatment well    Behavior During Therapy WFL for tasks assessed/performed              Past Medical History:  Diagnosis Date   Angio-edema    Arthritis    CKD (chronic kidney disease), stage III ([place])     saw dr Florene Glen 2 years ago, now released from nephrology   Complication of anesthesia 2010   "sluggish after anesthesia and had vertigo", slow to awaken after knee artroscopy 2012   Diabetes ([place])    Eczema    at times   GERD (gastroesophageal reflux disease)    HTN (hypertension)    Hypercholesteremia    Lymphedema of leg    right more than RIGHT   Narrowing of airway 2010   Neuropathy    Numbness and tingling of right leg    Osteoarthritis    Osteopenia    Osteoporosis    PONV (postoperative nausea and vomiting)    pt has n/v and vertigo after anesthesia   PSVT (paroxysmal supraventricular tachycardia) (HCC)    Recurrent upper respiratory infection (URI)    Sinus tachycardia    Vertigo    at times, cannot turn on left side quickly or sleep on left side   Vitamin D deficiency    Past Surgical History:  Procedure Laterality Date   arthroscopic knee surgery Left 2012   CARDIAC CATHETERIZATION  2012   JOINT REPLACEMENT Right 2010   LUMBAR LAMINECTOMY/DECOMPRESSION MICRODISCECTOMY N/A 10/22/2013   Procedure: MICROLUMBAR DECOMPRESSION LUMBAR TWO TO THREE, LUMBAR THREE TO FOUR and faramenotomy two,threeand four ,five bilateral;  Surgeon: Johnn Hai, MD;  Location: [facility];  Service: Orthopedics;  Laterality: N/A;   NASAL SINUS SURGERY  yrs  ago    x 2   TOTAL KNEE ARTHROPLASTY Left 03/01/2015   Procedure: LEFT TOTAL KNEE ARTHROPLASTY;  Surgeon: Gaynelle Arabian, MD;  Location: [facility];  Service: Orthopedics;  Laterality: Left;   Patient Active Problem List   Diagnosis Date Noted   Seasonal and perennial allergic rhinitis 05/10/2021   Dry skin 05/10/2021   Lymphedema 11/06/2017   PSVT (paroxysmal supraventricular tachycardia) (HCC)    Sinus tachycardia    HTN (hypertension)    Osteoporosis without current pathological fracture 04/13/2017   OA (osteoarthritis) of knee 03/01/2015   Preoperative cardiovascular examination 01/05/2015   Spinal stenosis of lumbar region 10/22/2013    PCP: Leeroy Cha, MD  REFERRING PROVIDER: Melvenia Beam, MD  REFERRING DIAG: G62.9 (ICD-10-CM) - Polyneuropathy I89.0 (ICD-10-CM) - Lymphedema R26.9 (ICD-10-CM) - Gait abnormality M79.604,M79.605 (ICD-10-CM) - Pain in both lower extremities   THERAPY DIAG:  Chronic pain of right knee  Chronic pain of left knee  Muscle weakness (generalized)  Difficulty in walking, not elsewhere classified  ONSET DATE: Chronic / MD order/visit 10/10/2021  SUBJECTIVE:   SUBJECTIVE STATEMENT: -Pt returned to neurologist on 10/10/2021 and was prescribed Lyrica.  Pt states she is feeling much better since starting Lyrica.  She is not having  as much nerve pain and numbness at night and is sleeping better.  She continues to have the "arthritis pain" in her knees.  10/10/2021 MD note indicated Pt's pain likely multifactorial including significant edema/lymphedema, possibly some mild diabetic neuropathy, lumbar arthritis and hx of lumbar degenerative disease and spinal stenosis s/p decompression.    -Pt doesn't use the cane in her home, but uses a cane with community ambulation.  Pt has difficulty with car transfers and stepping up on a curb.  Pt is limited with standing duration and ambulation distance.   -Pt has occasional stiffness in knees.  Pt has pain  and stiffness in bilat knees ambulation in a big box store.  Pt reports improved mobility and reduced pain.  Pt states she felt good after prior aquatic Rx though had discomfort and stiffness in bilat knees.      PERTINENT HISTORY: -Chronic condition; MD script indicated aquatherapy at [place] for gait abnormality, difficulty walking, and extensive edema/lymphedema. -DM type 2, lumbar spinal stenosis, neuropathy, lymphedema in the legs, chronic kidney disease stage III.  Osteoporosis paroxysmal supraventricular tachycardia (had one occasion and is on meds). -PSHx:  bilat knee replacements R: 2010, L: 2016 and lumbar spinal decompression and laminectomy surgery (2017)     PAIN:  Are you having pain? No currently NRPS scale: Pt denies pain currently  Pain location: Bilat knees > thighs and distal LEs.  Bilat hips. Pain description: constant, burning, tingling, and aching, stinging, throbbing  Aggravating factors: community ambulation, shopping Relieving factors: Lyrica, Tylenol  PRECAUTIONS: Other: chronic condition, lymphadema, neuropathy, osteoporosis   PLOF: Independent. Pt uses a cane in community ambulation for 1 year.  PATIENT GOALS Improve mobility.  Ambulate without cane.  Improve balance.   OBJECTIVE:   DIAGNOSTIC FINDINGS:  IMPRESSION: 1. Postoperative changes at L2-3 and L3-4 with wide decompressive laminectomies. 2. Moderate-sized disc extrusion at L3-4 as discussed above. There is mild mass effect on the ventral thecal sac and bilateral lateral recess encroachment. Mild bilateral foraminal encroachment also. 3. Multifactorial mild spinal and moderate bilateral lateral recess stenosis at L4-5. There is also bilateral foraminal stenosis     Observation:  Pt has swelling in bilat LEs.    TODAY'S TREATMENT: Therapeutic Exercise: Reviewed response to prior Rx, current function, and pain level. Pt performed:  Seated hip abd with RTB 2x10 reps  Supine clams  with RTB 2x10 reps  -Attempted supine SLR 2-4 reps and S/L hip abd approx 2-3 reps bilat and Pt had difficulty  -SAQ 2x10 reps  -supine hip abd/add 2x5 reps    -seated marching 2x10 reps  PATIENT EDUCATION:  Education details: Educated pt in [place].  Answered Pt's questions.  Exercise form.  Gave pt a HEP handout and educated in correct form and appropriate frequency.  Educated pt she should not have increased pain with HEP. Person educated: Patient  Education method: Explanation, demonstration, verbal and tactile cues, and handout Education comprehension: verbalized understanding, demonstration, verbal and tactile cues required.   HOME EXERCISE PROGRAM: Access Code: [email or web address] URL: [email or web address] Date: 11/23/2021 Prepared by: Ronny Flurry  Exercises Supine Short Arc Quad - 1-2 x daily - 5-7 x weekly - 2 sets - 10 reps Seated March - 1 x daily - 7 x weekly - 2 sets - 10 reps Supine Hip Abduction AROM - 1 x daily - 7 x weekly - 2 sets - 5 reps Seated Hip Abduction with Resistance - 1 x daily - 3-4 x weekly - 2 sets - 10 reps  Hooklying Clamshell with Resistance - 1 x daily - 3-4 x weekly - 2 sets - 10 reps   ASSESSMENT:  CLINICAL IMPRESSION: Patient reports improved mobility.  Pt has swelling and significant weakness in bilat LE's with greater strength deficits in R LE > L LE.  She has much difficulty with and decreased ability with performing supine SLR and S/L hip though worse on R LE.  Established HEP today and gave pt a HEP handout.  Pt demonstrates good understanding of HEP and was fatigued with exercises today.  Pt responded well to Rx having no increased pain after Rx.  She should benefit from continued skilled PT to address goals and impairments and improve overall function.   Objective impairments include Abnormal gait, decreased activity tolerance, decreased balance, decreased endurance, decreased mobility, difficulty walking, decreased strength, increased  edema, impaired flexibility, and pain. These impairments are limiting patient from cleaning, community activity, shopping, and transfers, and ambulation . Personal factors including Time since onset of injury/illness/exacerbation and 3+ comorbidities: DM Type 2, lumbar spinal stenosis with lumbar surgery, Osteoporosis, and bilat TKR.  are also affecting patient's functional outcome.   REHAB POTENTIAL: Good  CLINICAL DECISION MAKING: Evolving/moderate complexity  EVALUATION COMPLEXITY: Moderate   GOALS:  SHORT TERM GOALS:  STG Name Target Date Goal status  1 Pt will tolerate aquatic therapy without adverse effects for improved tolerance to activity and daily mobility.  Baseline:  11/23/2021 INITIAL  2 Pt will report improved balance and stability with ambulation and her daily standing activities.   Baseline:  11/30/2021 INITIAL  3 Pt will report at least a 25% improvement overall in pain and 40% improvement in daily mobility.  Baseline: 12/07/2021 INITIAL                       LONG TERM GOALS:   LTG Name Target Date Goal status  1 Pt will demo improved strength to 4/5 in bilat hip flexion, 4+/5 in bilat knees, and improved R hip abd to = L LE for improved performance of functional mobility skills.   Baseline: 12/21/2021 INITIAL  2 Pt will be able to perform car transfers without difficulty.   Baseline: 12/21/2021 INITIAL  3 Pt will be able to ambulate community distance without significant pain or difficulty.  Baseline: 12/21/2021 INITIAL  4 Pt will report improved standing tolerance with daily activities and household activities.  Baseline: 12/21/2021 INITIAL  5 Pt will be able to ascend and descend 1-2 steps without significant difficulty for improved ability to ascend/descend curb.  Baseline: 12/21/2021 INITIAL  6 Pt will be independent with aquatic HEP for improved strength, tolerance to activity, and performance of daily mobility Baseline: 12/21/2021 INITIAL         PLAN:   PLANNED INTERVENTIONS: Therapeutic exercises, Therapeutic activity, Neuro Muscular re-education, Balance training, Gait training, Patient/Family education, Stair training, Aquatic Therapy, Dry Needling, Electrical stimulation, Cryotherapy, Moist heat, Taping, Ultrasound, and Manual therapy  PLAN FOR NEXT SESSION: Cont with Aquatic Therapy   Audie Clear III PT, DPT 11/23/21 2:51 PM

## 2021-11-30 ENCOUNTER — Ambulatory Visit (HOSPITAL_BASED_OUTPATIENT_CLINIC_OR_DEPARTMENT_OTHER): Payer: Medicare HMO | Admitting: Physical Therapy

## 2021-12-02 ENCOUNTER — Ambulatory Visit (HOSPITAL_BASED_OUTPATIENT_CLINIC_OR_DEPARTMENT_OTHER): Payer: Medicare HMO | Admitting: Physical Therapy

## 2021-12-06 ENCOUNTER — Ambulatory Visit (HOSPITAL_BASED_OUTPATIENT_CLINIC_OR_DEPARTMENT_OTHER): Payer: Medicare HMO | Admitting: Physical Therapy

## 2021-12-09 ENCOUNTER — Encounter (HOSPITAL_BASED_OUTPATIENT_CLINIC_OR_DEPARTMENT_OTHER): Payer: Self-pay | Admitting: Physical Therapy

## 2021-12-09 ENCOUNTER — Ambulatory Visit (HOSPITAL_BASED_OUTPATIENT_CLINIC_OR_DEPARTMENT_OTHER): Payer: Medicare HMO | Attending: Neurology | Admitting: Physical Therapy

## 2021-12-09 ENCOUNTER — Other Ambulatory Visit: Payer: Self-pay

## 2021-12-09 DIAGNOSIS — R262 Difficulty in walking, not elsewhere classified: Secondary | ICD-10-CM

## 2021-12-09 DIAGNOSIS — M25561 Pain in right knee: Secondary | ICD-10-CM | POA: Insufficient documentation

## 2021-12-09 DIAGNOSIS — M25562 Pain in left knee: Secondary | ICD-10-CM | POA: Insufficient documentation

## 2021-12-09 DIAGNOSIS — M6281 Muscle weakness (generalized): Secondary | ICD-10-CM

## 2021-12-09 DIAGNOSIS — G8929 Other chronic pain: Secondary | ICD-10-CM

## 2021-12-09 NOTE — Therapy (Signed)
OUTPATIENT PHYSICAL THERAPY LOWER EXTREMITY EVALUATION   Patient Name: Holly Fields MRN: [id number] DOB:1941-08-02, 81 y.o., female Today's Date: 12/09/2021   PT End of Session - 12/09/21 1201     Visit Number 4    Number of Visits 12    Date for PT Re-Evaluation 12/21/21    Authorization Type AETNA    PT Start Time 1202    PT Stop Time 1245    PT Time Calculation (min) 43 min    Activity Tolerance Patient tolerated treatment well    Behavior During Therapy WFL for tasks assessed/performed              Past Medical History:  Diagnosis Date   Angio-edema    Arthritis    CKD (chronic kidney disease), stage III ([place])     saw dr Florene Glen 2 years ago, now released from nephrology   Complication of anesthesia 2010   "sluggish after anesthesia and had vertigo", slow to awaken after knee artroscopy 2012   Diabetes ([place])    Eczema    at times   GERD (gastroesophageal reflux disease)    HTN (hypertension)    Hypercholesteremia    Lymphedema of leg    right more than RIGHT   Narrowing of airway 2010   Neuropathy    Numbness and tingling of right leg    Osteoarthritis    Osteopenia    Osteoporosis    PONV (postoperative nausea and vomiting)    pt has n/v and vertigo after anesthesia   PSVT (paroxysmal supraventricular tachycardia) (HCC)    Recurrent upper respiratory infection (URI)    Sinus tachycardia    Vertigo    at times, cannot turn on left side quickly or sleep on left side   Vitamin D deficiency    Past Surgical History:  Procedure Laterality Date   arthroscopic knee surgery Left 2012   CARDIAC CATHETERIZATION  2012   JOINT REPLACEMENT Right 2010   LUMBAR LAMINECTOMY/DECOMPRESSION MICRODISCECTOMY N/A 10/22/2013   Procedure: MICROLUMBAR DECOMPRESSION LUMBAR TWO TO THREE, LUMBAR THREE TO FOUR and faramenotomy two,threeand four ,five bilateral;  Surgeon: Johnn Hai, MD;  Location: [facility];  Service: Orthopedics;  Laterality: N/A;   NASAL SINUS SURGERY  yrs  ago    x 2   TOTAL KNEE ARTHROPLASTY Left 03/01/2015   Procedure: LEFT TOTAL KNEE ARTHROPLASTY;  Surgeon: Gaynelle Arabian, MD;  Location: [facility];  Service: Orthopedics;  Laterality: Left;   Patient Active Problem List   Diagnosis Date Noted   Seasonal and perennial allergic rhinitis 05/10/2021   Dry skin 05/10/2021   Lymphedema 11/06/2017   PSVT (paroxysmal supraventricular tachycardia) (HCC)    Sinus tachycardia    HTN (hypertension)    Osteoporosis without current pathological fracture 04/13/2017   OA (osteoarthritis) of knee 03/01/2015   Preoperative cardiovascular examination 01/05/2015   Spinal stenosis of lumbar region 10/22/2013    PCP: Leeroy Cha, MD  REFERRING PROVIDER: Melvenia Beam, MD  REFERRING DIAG: G62.9 (ICD-10-CM) - Polyneuropathy I89.0 (ICD-10-CM) - Lymphedema R26.9 (ICD-10-CM) - Gait abnormality M79.604,M79.605 (ICD-10-CM) - Pain in both lower extremities   THERAPY DIAG:  Chronic pain of right knee  Chronic pain of left knee  Muscle weakness (generalized)  Difficulty in walking, not elsewhere classified  ONSET DATE: Chronic / MD order/visit 10/10/2021  SUBJECTIVE:   SUBJECTIVE STATEMENT:  "Neuropathy is bad, feels like a bee stinging from my knees down to my feet. I am waking around 430. It seems to go in  cycles"        PERTINENT HISTORY: -Chronic condition; MD script indicated aquatherapy at [place] for gait abnormality, difficulty walking, and extensive edema/lymphedema. -DM type 2, lumbar spinal stenosis, neuropathy, lymphedema in the legs, chronic kidney disease stage III.  Osteoporosis paroxysmal supraventricular tachycardia (had one occasion and is on meds). -PSHx:  bilat knee replacements R: 2010, L: 2016 and lumbar spinal decompression and laminectomy surgery (2017)     PAIN:  Are you having pain? No currently NRPS scale: 10/10 Pain location: Bilat knees > thighs and distal LEs.  Bilat hips. Pain description: constant,  burning, tingling, and aching, stinging, throbbing  Aggravating factors: community ambulation, shopping Relieving factors: Lyrica, Tylenol  PRECAUTIONS: Other: chronic condition, lymphadema, neuropathy, osteoporosis   PLOF: Independent. Pt uses a cane in community ambulation for 1 year.  PATIENT GOALS Improve mobility.  Ambulate without cane.  Improve balance.   OBJECTIVE:   DIAGNOSTIC FINDINGS:  IMPRESSION: 1. Postoperative changes at L2-3 and L3-4 with wide decompressive laminectomies. 2. Moderate-sized disc extrusion at L3-4 as discussed above. There is mild mass effect on the ventral thecal sac and bilateral lateral recess encroachment. Mild bilateral foraminal encroachment also. 3. Multifactorial mild spinal and moderate bilateral lateral recess stenosis at L4-5. There is also bilateral foraminal stenosis     Observation:  Pt has swelling in bilat LEs.    TODAY'S TREATMENT: 12/9 Pt seen for aquatic therapy today.  Treatment took place in water 3.25-4.8 ft in depth at the [facility] pool. Temp of water was 93 degrees.  Pt entered(backwards)/exited the pool via stairs step to independently with bilat rail.   Walking forward, retro and sidestepping in 3 ft 4 widths each.     Seated stretching: gastroc, hamstrings and adductors 3 x 20-25 second hold -flutter kicking at hip (SLR) 3x20 reps -cycle 3x20 revoultions -Add/abd 2x20 -marching 2x10 -SAQ x 10   Standing: DF/PF;marching; add/abd; hip flex/ext holding to wall x 10 in 3 ft  Gait training x 4 widths without ue support. Cues for increased step length and heel strike.  Pt requires buoyancy for support and to offload joints with strengthening exercises. Viscosity of the water is needed for resistance of strengthening; water current perturbations provides challenge to standing balance unsupported, requiring increased core activation.   PATIENT EDUCATION:  Education details: Educated pt in [place].  Answered  Pt's questions.  Exercise form.  Gave pt a HEP handout and educated in correct form and appropriate frequency.  Educated pt she should not have increased pain with HEP. Person educated: Patient  Education method: Explanation, demonstration, verbal and tactile cues, and handout Education comprehension: verbalized understanding, demonstration, verbal and tactile cues required.   HOME EXERCISE PROGRAM: Access Code: [email or web address] URL: [email or web address] Date: 11/23/2021 Prepared by: Ronny Flurry Added aquatics 12/09/21 FZ   Exercises Supine Short Arc Quad - 1-2 x daily - 5-7 x weekly - 2 sets - 10 reps Seated March - 1 x daily - 7 x weekly - 2 sets - 10 reps Supine Hip Abduction AROM - 1 x daily - 7 x weekly - 2 sets - 5 reps Seated Hip Abduction with Resistance - 1 x daily - 3-4 x weekly - 2 sets - 10 reps Hooklying Clamshell with Resistance - 1 x daily - 3-4 x weekly - 2 sets - 10 reps Standing Hip Abduction Adduction at Pool Wall - 1 x daily - 7 x weekly - 3 sets - 10 reps Standing Hip Flexion Extension at [employer] - 1  x daily - 7 x weekly - 3 sets - 10 reps Heel Toe Raises at [place] - 1 x daily - 7 x weekly - 3 sets - 10 reps Standing March at [place] 1 x daily - 7 x weekly - 3 sets - 10 reps   ASSESSMENT:  CLINICAL IMPRESSION: Pt enters session with 10/10 pain, decreasing to 7-8/10 upon completion of session.  She demos increased antalgic gait with decreased knee flex in and out of water. She did complete exercises with good effort but movements slow, deliberate and with some hesitation. She has begum Lyrica 1-2 months ago with reports of some improvement in neuropathy pain. Will call MD to inquire if she can increase dose. Pt tolerates entire session benefiting from hydrostatic pressure for improved circulation and edema reducing as well as buoyancy for improved movement with less weight bearing. She reports compliance with on land HEP.   Objective impairments  include Abnormal gait, decreased activity tolerance, decreased balance, decreased endurance, decreased mobility, difficulty walking, decreased strength, increased edema, impaired flexibility, and pain. These impairments are limiting patient from cleaning, community activity, shopping, and transfers, and ambulation . Personal factors including Time since onset of injury/illness/exacerbation and 3+ comorbidities: DM Type 2, lumbar spinal stenosis with lumbar surgery, Osteoporosis, and bilat TKR.  are also affecting patient's functional outcome.   REHAB POTENTIAL: Good  CLINICAL DECISION MAKING: Evolving/moderate complexity  EVALUATION COMPLEXITY: Moderate   GOALS:  SHORT TERM GOALS:  STG Name Target Date Goal status  1 Pt will tolerate aquatic therapy without adverse effects for improved tolerance to activity and daily mobility.  Baseline:  11/23/2021 INITIAL  2 Pt will report improved balance and stability with ambulation and her daily standing activities.   Baseline:  11/30/2021 INITIAL  3 Pt will report at least a 25% improvement overall in pain and 40% improvement in daily mobility.  Baseline: 12/07/2021 INITIAL                       LONG TERM GOALS:   LTG Name Target Date Goal status  1 Pt will demo improved strength to 4/5 in bilat hip flexion, 4+/5 in bilat knees, and improved R hip abd to = L LE for improved performance of functional mobility skills.   Baseline: 12/21/2021 INITIAL  2 Pt will be able to perform car transfers without difficulty.   Baseline: 12/21/2021 INITIAL  3 Pt will be able to ambulate community distance without significant pain or difficulty.  Baseline: 12/21/2021 INITIAL  4 Pt will report improved standing tolerance with daily activities and household activities.  Baseline: 12/21/2021 INITIAL  5 Pt will be able to ascend and descend 1-2 steps without significant difficulty for improved ability to ascend/descend curb.  Baseline: 12/21/2021 INITIAL  6 Pt will  be independent with aquatic HEP for improved strength, tolerance to activity, and performance of daily mobility Baseline: 12/21/2021 INITIAL        PLAN:   PLANNED INTERVENTIONS: Therapeutic exercises, Therapeutic activity, Neuro Muscular re-education, Balance training, Gait training, Patient/Family education, Stair training, Aquatic Therapy, Dry Needling, Electrical stimulation, Cryotherapy, Moist heat, Taping, Ultrasound, and Manual therapy  PLAN FOR NEXT SESSION: Cont with Aquatic Therapy/advancing strengthening.   Liyla Tharon Aquas) Giamarie Bueche MPT 12/09/21 1:10 PM

## 2021-12-12 ENCOUNTER — Telehealth: Payer: Self-pay | Admitting: Neurology

## 2021-12-12 ENCOUNTER — Other Ambulatory Visit: Payer: Self-pay | Admitting: Neurology

## 2021-12-12 MED ORDER — PREGABALIN 100 MG PO CAPS: 100.0000 mg | ORAL_CAPSULE | Freq: Two times a day (BID) | ORAL | 5 refills | Status: DC

## 2021-12-12 NOTE — Telephone Encounter (Addendum)
Spoke with patient.  She does prefer BID dosing because it would be easier for her to remember than TID dosing. She has five 50 mg capsules left and she currently takes 50 mg QHS. Spoke with Dr Lucia Gaskins. She advised to increase to 100 mg BID. New prescription written and pending e-signature. Per  registry, last filled pregabalin 50 mg on 11/12/2021 #30/30.   Spoke with patient. She is aware of new Rx being written for Pregabalin 100 mg BID and cancellation of 50 mg Rx. She will call if she has any concerns after starting new dose. Also scheduled f/u with Shanda Bumps NP on Thurs 3/16 @ 11:15 AM arrival 10:45 AM. She verbalized appreciation for the call.

## 2021-12-12 NOTE — Addendum Note (Signed)
Addended by: Gildardo Griffes on: 12/12/2021 03:57 PM   Modules accepted: Orders

## 2021-12-12 NOTE — Telephone Encounter (Signed)
Spoke with the patient.  She stated for the first month the Lyrica worked well for her.  She also has been participating in water therapy which does help however in the second month the neuropathy has worsened.  She reports "lots of pain" in her legs at night and she is unable to sleep.  She denies any known current side effects.  She had a little dizziness the first day but it did not continue.  Patient would like her dosage increased if appropriate.

## 2021-12-12 NOTE — Telephone Encounter (Signed)
Pt states her pain has come back and would like to discuss an increase on her pregabalin (LYRICA) 50 MG capsule

## 2021-12-13 ENCOUNTER — Other Ambulatory Visit: Payer: Self-pay

## 2021-12-13 ENCOUNTER — Encounter (HOSPITAL_BASED_OUTPATIENT_CLINIC_OR_DEPARTMENT_OTHER): Payer: Self-pay | Admitting: Physical Therapy

## 2021-12-13 ENCOUNTER — Ambulatory Visit (HOSPITAL_BASED_OUTPATIENT_CLINIC_OR_DEPARTMENT_OTHER): Payer: Medicare HMO | Admitting: Physical Therapy

## 2021-12-13 DIAGNOSIS — M25562 Pain in left knee: Secondary | ICD-10-CM

## 2021-12-13 DIAGNOSIS — G8929 Other chronic pain: Secondary | ICD-10-CM

## 2021-12-13 DIAGNOSIS — R262 Difficulty in walking, not elsewhere classified: Secondary | ICD-10-CM

## 2021-12-13 DIAGNOSIS — M6281 Muscle weakness (generalized): Secondary | ICD-10-CM

## 2021-12-13 DIAGNOSIS — M25561 Pain in right knee: Secondary | ICD-10-CM | POA: Diagnosis not present

## 2021-12-13 NOTE — Therapy (Signed)
OUTPATIENT PHYSICAL THERAPY LOWER EXTREMITY EVALUATION   Patient Name: Holly Fields MRN: [id number] DOB:[birth year], 81 y.o., female Today's Date: 12/13/2021   PT End of Session - 12/13/21 1605     Visit Number 5    Number of Visits 12    Date for PT Re-Evaluation 12/21/21    Authorization Type AETNA    PT Start Time 1428    PT Stop Time 1513    PT Time Calculation (min) 45 min    Activity Tolerance Patient tolerated treatment well    Behavior During Therapy WFL for tasks assessed/performed               Past Medical History:  Diagnosis Date   Angio-edema    Arthritis    CKD (chronic kidney disease), stage III ([place])     saw dr Florene Glen 2 years ago, now released from nephrology   Complication of anesthesia 2010   "sluggish after anesthesia and had vertigo", slow to awaken after knee artroscopy 2012   Diabetes ([place])    Eczema    at times   GERD (gastroesophageal reflux disease)    HTN (hypertension)    Hypercholesteremia    Lymphedema of leg    right more than RIGHT   Narrowing of airway 2010   Neuropathy    Numbness and tingling of right leg    Osteoarthritis    Osteopenia    Osteoporosis    PONV (postoperative nausea and vomiting)    pt has n/v and vertigo after anesthesia   PSVT (paroxysmal supraventricular tachycardia) (HCC)    Recurrent upper respiratory infection (URI)    Sinus tachycardia    Vertigo    at times, cannot turn on left side quickly or sleep on left side   Vitamin D deficiency    Past Surgical History:  Procedure Laterality Date   arthroscopic knee surgery Left 2012   CARDIAC CATHETERIZATION  2012   JOINT REPLACEMENT Right 2010   LUMBAR LAMINECTOMY/DECOMPRESSION MICRODISCECTOMY N/A 10/22/2013   Procedure: MICROLUMBAR DECOMPRESSION LUMBAR TWO TO THREE, LUMBAR THREE TO FOUR and faramenotomy two,threeand four ,five bilateral;  Surgeon: Johnn Hai, MD;  Location: [facility];  Service: Orthopedics;  Laterality: N/A;   NASAL SINUS SURGERY   yrs ago    x 2   TOTAL KNEE ARTHROPLASTY Left 03/01/2015   Procedure: LEFT TOTAL KNEE ARTHROPLASTY;  Surgeon: Gaynelle Arabian, MD;  Location: [facility];  Service: Orthopedics;  Laterality: Left;   Patient Active Problem List   Diagnosis Date Noted   Seasonal and perennial allergic rhinitis 05/10/2021   Dry skin 05/10/2021   Lymphedema 11/06/2017   PSVT (paroxysmal supraventricular tachycardia) (HCC)    Sinus tachycardia    HTN (hypertension)    Osteoporosis without current pathological fracture 04/13/2017   OA (osteoarthritis) of knee 03/01/2015   Preoperative cardiovascular examination 01/05/2015   Spinal stenosis of lumbar region 10/22/2013    PCP: Leeroy Cha, MD  REFERRING PROVIDER: Melvenia Beam, MD  REFERRING DIAG: G62.9 (ICD-10-CM) - Polyneuropathy I89.0 (ICD-10-CM) - Lymphedema R26.9 (ICD-10-CM) - Gait abnormality M79.604,M79.605 (ICD-10-CM) - Pain in both lower extremities   THERAPY DIAG:  Chronic pain of right knee  Chronic pain of left knee  Muscle weakness (generalized)  Difficulty in walking, not elsewhere classified  ONSET DATE: Chronic / MD order/visit 10/10/2021  SUBJECTIVE:   SUBJECTIVE STATEMENT: "I have had a really bad attack of my neuropathy".  Pt called MD and MD is increasing the dosage of pregabalin. Pt states her  knees are her biggest issues and she has pain and stiffness in knees after exercise or activity.   Pt reports compliance with HEP and she has increased pain and stiffness in knees with HEP.  Pt c/o's of stiffness in bilat LE's.  Pt states the water feels good to her muscles though she continues to have issues with her knees.  Pt states she felt a little better after prior Rx.  Pt denies any improvement in pain or function.         PERTINENT HISTORY: -Chronic condition; MD script indicated aquatherapy at [place] for gait abnormality, difficulty walking, and extensive edema/lymphedema. -DM type 2, lumbar spinal stenosis,  neuropathy, lymphedema in the legs, chronic kidney disease stage III.  Osteoporosis paroxysmal supraventricular tachycardia (had one occasion and is on meds). -PSHx:  bilat knee replacements R: 2010, L: 2016 and lumbar spinal decompression and laminectomy surgery (2017)     PAIN:  Are you having pain? No currently NRPS scale: 8/10 Pain location: Bilat knees > thighs and distal LEs.  Bilat hips. Pain description: constant, burning, tingling, and aching, stinging, throbbing  Aggravating factors: community ambulation, shopping Relieving factors: Lyrica, Tylenol  PRECAUTIONS: Other: chronic condition, lymphadema, neuropathy, osteoporosis   PLOF: Independent. Pt uses a cane in community ambulation for 1 year.  PATIENT GOALS Improve mobility.  Ambulate without cane.  Improve balance.   OBJECTIVE:   DIAGNOSTIC FINDINGS:  IMPRESSION: 1. Postoperative changes at L2-3 and L3-4 with wide decompressive laminectomies. 2. Moderate-sized disc extrusion at L3-4 as discussed above. There is mild mass effect on the ventral thecal sac and bilateral lateral recess encroachment. Mild bilateral foraminal encroachment also. 3. Multifactorial mild spinal and moderate bilateral lateral recess stenosis at L4-5. There is also bilateral foraminal stenosis     Observation:  Pt has swelling in bilat LEs.    TODAY'S TREATMENT:  Pt seen for aquatic therapy today.  Treatment took place in water 3.5 ft depth at the [facility] pool. Temp of water was 94 degrees.  Pt entered(backwards)/exited the pool via stairs step to independently with bilat rail.   -Reviewed current function, response to prior Rx, and pain level.  -seated bicycles 3x20 reps -flutter kicks 2x10-15 reps -LAQ 2x10 reps -Walking forward, retro and sidestepping in 3 ft 6 inches x 3 laps each  -Marching 2x10 reps with bilat UE assist on wall -standing heel raises 2x10 reps with bilat UE assist on wall -standing hip abd 2x10  reps bilat with bilat UE assist on wall -standing hip flexion 2x10 reps bilat with UE assist on wall -standing hip circles with bilat UE assist on wall -Fwd ambulation with increased speed x 2 laps with aquatic DB's for 1.5 of those 2 laps  Pt requires buoyancy for support and to offload joints with strengthening exercises. Viscosity of the water is needed for resistance of strengthening; water current perturbations provides challenge to standing balance unsupported, requiring increased core activation.   PATIENT EDUCATION:  Education details: Educated pt in [place].  Answered Pt's questions.  Exercise form.  Person educated: Patient  Education method: Explanation, demonstration, verbal and tactile cues, and handout Education comprehension: verbalized understanding, demonstration, verbal and tactile cues required.   HOME EXERCISE PROGRAM: Access Code: [email or web address] URL: [email or web address] Date: 11/23/2021 Prepared by: Ronny Flurry Added aquatics 12/09/21 FZ   Exercises Supine Short Arc Quad - 1-2 x daily - 5-7 x weekly - 2 sets - 10 reps Seated March - 1 x daily - 7 x weekly - 2 sets -  10 reps Supine Hip Abduction AROM - 1 x daily - 7 x weekly - 2 sets - 5 reps Seated Hip Abduction with Resistance - 1 x daily - 3-4 x weekly - 2 sets - 10 reps Hooklying Clamshell with Resistance - 1 x daily - 3-4 x weekly - 2 sets - 10 reps Standing Hip Abduction Adduction at [employer] - 1 x daily - 7 x weekly - 3 sets - 10 reps Standing Hip Flexion Extension at [employer] - 1 x daily - 7 x weekly - 3 sets - 10 reps Heel Toe Raises at [place] - 1 x daily - 7 x weekly - 3 sets - 10 reps Standing March at [place] 1 x daily - 7 x weekly - 3 sets - 10 reps   ASSESSMENT:  CLINICAL IMPRESSION: Pt states the water feels good though she continues to have bilat knee pain.  Pt denies any improvement in function or pain.  Pt states her neuropathy has been worse and she is trying to increase her  meds currently.  She continues to have significant edema in bilat LE's and may benefit from hydrostatic pressure.  Pt performed aquatic exercises well with cuing and instruction for correct form.  She reports minimally improved pain from 8/10 before Rx to 7/10 after Rx.  Pt may benefit from cont skilled PT services to address goals and impairments and improve overall function.     Objective impairments include Abnormal gait, decreased activity tolerance, decreased balance, decreased endurance, decreased mobility, difficulty walking, decreased strength, increased edema, impaired flexibility, and pain. These impairments are limiting patient from cleaning, community activity, shopping, and transfers, and ambulation . Personal factors including Time since onset of injury/illness/exacerbation and 3+ comorbidities: DM Type 2, lumbar spinal stenosis with lumbar surgery, Osteoporosis, and bilat TKR.  are also affecting patient's functional outcome.   REHAB POTENTIAL: Good  CLINICAL DECISION MAKING: Evolving/moderate complexity  EVALUATION COMPLEXITY: Moderate   GOALS:  SHORT TERM GOALS:  STG Name Target Date Goal status  1 Pt will tolerate aquatic therapy without adverse effects for improved tolerance to activity and daily mobility.  Baseline:  11/23/2021 INITIAL  2 Pt will report improved balance and stability with ambulation and her daily standing activities.   Baseline:  11/30/2021 INITIAL  3 Pt will report at least a 25% improvement overall in pain and 40% improvement in daily mobility.  Baseline: 12/07/2021 INITIAL                       LONG TERM GOALS:   LTG Name Target Date Goal status  1 Pt will demo improved strength to 4/5 in bilat hip flexion, 4+/5 in bilat knees, and improved R hip abd to = L LE for improved performance of functional mobility skills.   Baseline: 12/21/2021 INITIAL  2 Pt will be able to perform car transfers without difficulty.   Baseline: 12/21/2021 INITIAL  3 Pt  will be able to ambulate community distance without significant pain or difficulty.  Baseline: 12/21/2021 INITIAL  4 Pt will report improved standing tolerance with daily activities and household activities.  Baseline: 12/21/2021 INITIAL  5 Pt will be able to ascend and descend 1-2 steps without significant difficulty for improved ability to ascend/descend curb.  Baseline: 12/21/2021 INITIAL  6 Pt will be independent with aquatic HEP for improved strength, tolerance to activity, and performance of daily mobility Baseline: 12/21/2021 INITIAL        PLAN:  PLANNED INTERVENTIONS: Therapeutic exercises, Therapeutic activity, Neuro Muscular re-education, Balance training, Gait training, Patient/Family education, Stair training, Aquatic Therapy, Dry Needling, Electrical stimulation, Cryotherapy, Moist heat, Taping, Ultrasound, and Manual therapy  PLAN FOR NEXT SESSION: Cont with Aquatic Therapy   Selinda Michaels III PT, DPT 12/13/21 5:00 PM

## 2021-12-16 ENCOUNTER — Other Ambulatory Visit: Payer: Self-pay

## 2021-12-16 ENCOUNTER — Ambulatory Visit (HOSPITAL_BASED_OUTPATIENT_CLINIC_OR_DEPARTMENT_OTHER): Payer: Medicare HMO | Admitting: Physical Therapy

## 2021-12-16 ENCOUNTER — Encounter (HOSPITAL_BASED_OUTPATIENT_CLINIC_OR_DEPARTMENT_OTHER): Payer: Self-pay | Admitting: Physical Therapy

## 2021-12-16 DIAGNOSIS — R262 Difficulty in walking, not elsewhere classified: Secondary | ICD-10-CM

## 2021-12-16 DIAGNOSIS — M6281 Muscle weakness (generalized): Secondary | ICD-10-CM

## 2021-12-16 DIAGNOSIS — G8929 Other chronic pain: Secondary | ICD-10-CM

## 2021-12-16 DIAGNOSIS — M25561 Pain in right knee: Secondary | ICD-10-CM | POA: Diagnosis not present

## 2021-12-16 NOTE — Therapy (Signed)
OUTPATIENT PHYSICAL THERAPY LOWER EXTREMITY EVALUATION   Patient Name: Holly Fields MRN: [id number] DOB:31-Jan-1941, 81 y.o., female Today's Date: 12/16/2021   PT End of Session - 12/16/21 1043     Visit Number 6    Number of Visits 12    Date for PT Re-Evaluation 12/21/21    Authorization Type AETNA    PT Start Time 1035    PT Stop Time 1115    PT Time Calculation (min) 40 min    Activity Tolerance Patient tolerated treatment well    Behavior During Therapy WFL for tasks assessed/performed               Past Medical History:  Diagnosis Date   Angio-edema    Arthritis    CKD (chronic kidney disease), stage III ([place])     saw dr Florene Glen 2 years ago, now released from nephrology   Complication of anesthesia 2010   "sluggish after anesthesia and had vertigo", slow to awaken after knee artroscopy 2012   Diabetes ([place])    Eczema    at times   GERD (gastroesophageal reflux disease)    HTN (hypertension)    Hypercholesteremia    Lymphedema of leg    right more than RIGHT   Narrowing of airway 2010   Neuropathy    Numbness and tingling of right leg    Osteoarthritis    Osteopenia    Osteoporosis    PONV (postoperative nausea and vomiting)    pt has n/v and vertigo after anesthesia   PSVT (paroxysmal supraventricular tachycardia) (HCC)    Recurrent upper respiratory infection (URI)    Sinus tachycardia    Vertigo    at times, cannot turn on left side quickly or sleep on left side   Vitamin D deficiency    Past Surgical History:  Procedure Laterality Date   arthroscopic knee surgery Left 2012   CARDIAC CATHETERIZATION  2012   JOINT REPLACEMENT Right 2010   LUMBAR LAMINECTOMY/DECOMPRESSION MICRODISCECTOMY N/A 10/22/2013   Procedure: MICROLUMBAR DECOMPRESSION LUMBAR TWO TO THREE, LUMBAR THREE TO FOUR and faramenotomy two,threeand four ,five bilateral;  Surgeon: Johnn Hai, MD;  Location: [facility];  Service: Orthopedics;  Laterality: N/A;   NASAL SINUS SURGERY   yrs ago    x 2   TOTAL KNEE ARTHROPLASTY Left 03/01/2015   Procedure: LEFT TOTAL KNEE ARTHROPLASTY;  Surgeon: Gaynelle Arabian, MD;  Location: [facility];  Service: Orthopedics;  Laterality: Left;   Patient Active Problem List   Diagnosis Date Noted   Seasonal and perennial allergic rhinitis 05/10/2021   Dry skin 05/10/2021   Lymphedema 11/06/2017   PSVT (paroxysmal supraventricular tachycardia) (HCC)    Sinus tachycardia    HTN (hypertension)    Osteoporosis without current pathological fracture 04/13/2017   OA (osteoarthritis) of knee 03/01/2015   Preoperative cardiovascular examination 01/05/2015   Spinal stenosis of lumbar region 10/22/2013    PCP: Leeroy Cha, MD  REFERRING PROVIDER: Melvenia Beam, MD  REFERRING DIAG: G62.9 (ICD-10-CM) - Polyneuropathy I89.0 (ICD-10-CM) - Lymphedema R26.9 (ICD-10-CM) - Gait abnormality M79.604,M79.605 (ICD-10-CM) - Pain in both lower extremities   THERAPY DIAG:  Chronic pain of right knee  Chronic pain of left knee  Muscle weakness (generalized)  Difficulty in walking, not elsewhere classified  ONSET DATE: Chronic / MD order/visit 10/10/2021  SUBJECTIVE:   SUBJECTIVE STATEMENT: "My pain is better from the added medicine but I I was feeling loopy, I think it is too much"  PERTINENT HISTORY: -Chronic condition; MD script indicated aquatherapy at [place] for gait abnormality, difficulty walking, and extensive edema/lymphedema. -DM type 2, lumbar spinal stenosis, neuropathy, lymphedema in the legs, chronic kidney disease stage III.  Osteoporosis paroxysmal supraventricular tachycardia (had one occasion and is on meds). -PSHx:  bilat knee replacements R: 2010, L: 2016 and lumbar spinal decompression and laminectomy surgery (2017)     PAIN:  Are you having pain? Not currently NRPS scale: 0/10 Pain location: Bilat knees > thighs and distal LEs.  Bilat hips. Pain description: constant, burning, tingling, and aching,  stinging, throbbing  Aggravating factors: community ambulation, shopping Relieving factors: Lyrica, Tylenol  PRECAUTIONS: Other: chronic condition, lymphadema, neuropathy, osteoporosis   PLOF: Independent. Pt uses a cane in community ambulation for 1 year.  PATIENT GOALS Improve mobility.  Ambulate without cane.  Improve balance.   OBJECTIVE:   DIAGNOSTIC FINDINGS:  IMPRESSION: 1. Postoperative changes at L2-3 and L3-4 with wide decompressive laminectomies. 2. Moderate-sized disc extrusion at L3-4 as discussed above. There is mild mass effect on the ventral thecal sac and bilateral lateral recess encroachment. Mild bilateral foraminal encroachment also. 3. Multifactorial mild spinal and moderate bilateral lateral recess stenosis at L4-5. There is also bilateral foraminal stenosis     Observation:  Pt has swelling in bilat LEs.    TODAY'S TREATMENT:  Pt seen for aquatic therapy today.  Treatment took place in water 3.5 ft depth at the [facility] pool. Temp of water was 94 degrees.  Pt entered(backwards)/exited the pool via stairs step to independently with bilat rail.   -Reviewed current function, response to prior Rx, and pain level. Walking fwd, bwd and side stepping 3-4 widths each. -gait training: cues for increased step length, heel strike and increased speed for aerobic capacity benefit  Seated bicycles x20 reps - flutter kicks 2x10-15 reps;add/abd 3x20 -LAQ 2x10 reps -Marching 2x10 reps with bilat UE assist on wall -standing heel raises 2x10 reps with bilat UE assist on wall -standing hip abd 2x10 reps bilat with bilat UE assist on wall -standing hip flexion 2x10 reps bilat with UE assist on wall - mini squats   Pt requires buoyancy for support and to offload joints with strengthening exercises. Viscosity of the water is needed for resistance of strengthening; water current perturbations provides challenge to standing balance unsupported, requiring  increased core activation.   PATIENT EDUCATION:  Education details: Educated pt in [place].  Answered Pt's questions.  Exercise form.  Person educated: Patient  Education method: Explanation, demonstration, verbal and tactile cues, and handout Education comprehension: verbalized understanding, demonstration, verbal and tactile cues required.   HOME EXERCISE PROGRAM: Access Code: [email or web address] URL: [email or web address] Date: 11/23/2021 Prepared by: Ronny Flurry Added aquatics 12/09/21 FZ   Exercises Supine Short Arc Quad - 1-2 x daily - 5-7 x weekly - 2 sets - 10 reps Seated March - 1 x daily - 7 x weekly - 2 sets - 10 reps Supine Hip Abduction AROM - 1 x daily - 7 x weekly - 2 sets - 5 reps Seated Hip Abduction with Resistance - 1 x daily - 3-4 x weekly - 2 sets - 10 reps Hooklying Clamshell with Resistance - 1 x daily - 3-4 x weekly - 2 sets - 10 reps Standing Hip Abduction Adduction at Pool Wall - 1 x daily - 7 x weekly - 3 sets - 10 reps Standing Hip Flexion Extension at [employer] - 1 x daily - 7 x weekly - 3 sets - 10 reps  Heel Toe Raises at [facility] - 1 x daily - 7 x weekly - 3 sets - 10 reps Standing March at [facility] - 1 x daily - 7 x weekly - 3 sets - 10 reps   ASSESSMENT:  CLINICAL IMPRESSION: Pt has had increase in lyrica, reports having complete cessation  of knee pain but increased unsteadiness durinf day.  She will decrease her dose from 2xdaily to one and alert MD of side effect. Today she reports no pain knee or peripheral nerve pain just tightness.  She is carrying a considerable amount of LE edema may be reason for tightness in knee.  She completes all exercises without any adverse effects.   Pt states the water feels good though she continues to have bilat knee pain.  Pt denies any improvement in function or pain.  Pt states her neuropathy has been worse and she is trying to increase her meds currently.  She continues to have significant edema in bilat LE's  and may benefit from hydrostatic pressure.  Pt performed aquatic exercises well with cuing and instruction for correct form.  She reports minimally improved pain from 8/10 before Rx to 7/10 after Rx.  Pt may benefit from cont skilled PT services to address goals and impairments and improve overall function.     Objective impairments include Abnormal gait, decreased activity tolerance, decreased balance, decreased endurance, decreased mobility, difficulty walking, decreased strength, increased edema, impaired flexibility, and pain. These impairments are limiting patient from cleaning, community activity, shopping, and transfers, and ambulation . Personal factors including Time since onset of injury/illness/exacerbation and 3+ comorbidities: DM Type 2, lumbar spinal stenosis with lumbar surgery, Osteoporosis, and bilat TKR.  are also affecting patient's functional outcome.   REHAB POTENTIAL: Good  CLINICAL DECISION MAKING: Evolving/moderate complexity  EVALUATION COMPLEXITY: Moderate   GOALS:  SHORT TERM GOALS:  STG Name Target Date Goal status  1 Pt will tolerate aquatic therapy without adverse effects for improved tolerance to activity and daily mobility.  Baseline:  11/23/2021 INITIAL  2 Pt will report improved balance and stability with ambulation and her daily standing activities.   Baseline:  11/30/2021 INITIAL  3 Pt will report at least a 25% improvement overall in pain and 40% improvement in daily mobility.  Baseline: 12/07/2021 INITIAL                       LONG TERM GOALS:   LTG Name Target Date Goal status  1 Pt will demo improved strength to 4/5 in bilat hip flexion, 4+/5 in bilat knees, and improved R hip abd to = L LE for improved performance of functional mobility skills.   Baseline: 12/21/2021 INITIAL  2 Pt will be able to perform car transfers without difficulty.   Baseline: 12/21/2021 INITIAL  3 Pt will be able to ambulate community distance without significant pain or  difficulty.  Baseline: 12/21/2021 INITIAL  4 Pt will report improved standing tolerance with daily activities and household activities.  Baseline: 12/21/2021 INITIAL  5 Pt will be able to ascend and descend 1-2 steps without significant difficulty for improved ability to ascend/descend curb.  Baseline: 12/21/2021 INITIAL  6 Pt will be independent with aquatic HEP for improved strength, tolerance to activity, and performance of daily mobility Baseline: 12/21/2021 INITIAL        PLAN:   PLANNED INTERVENTIONS: Therapeutic exercises, Therapeutic activity, Neuro Muscular re-education, Balance training, Gait training, Patient/Family education, Stair training, Aquatic Therapy, Dry Needling, Electrical stimulation,  Cryotherapy, Moist heat, Taping, Ultrasound, and Manual therapy  PLAN FOR NEXT SESSION: Cont with Aquatic Therapy   [facility] ([place]) Sadonna Kotara MPT 12/16/21 1:56 PM

## 2021-12-20 ENCOUNTER — Telehealth: Payer: Self-pay | Admitting: Neurology

## 2021-12-20 MED ORDER — PREGABALIN 50 MG PO CAPS: ORAL_CAPSULE | ORAL | 2 refills | Status: DC

## 2021-12-20 NOTE — Telephone Encounter (Signed)
Spoke with patient and advised per Dr Lucia Gaskins to decrease back to Lyrica 50 mg each night, then when her side effects have resolved, she can increase to 50 mg BID. Pt verbalized understanding and stated she had run out of her 50 mg capsules and will need a new prescription. Pt understands she will no longer take the 100 mg capsules. Will send Rx request to Dr Lucia Gaskins. Per [place] registry, last filled 50 mg on 11/12/21 #30/30 and 100 mg on 12/12/21 #60.

## 2021-12-20 NOTE — Addendum Note (Signed)
Addended by: Naomie Dean B on: 12/20/2021 04:33 PM   Modules accepted: Orders

## 2021-12-20 NOTE — Telephone Encounter (Addendum)
Spoke with the patient.  She states shortly after she increased Lyrica from 50 mg at bedtime to 100 mg twice a day, she started having side effects.  She switched to taking just 100 mg at night and she was still experiencing dizziness, shaking and blurry vision.  She then tried just 100 mg in the morning but still felt side effects.  She states she is not sure what she needs to try, ? 50 mg twice daily?  She states she is very sensitive to medication. She has water therapy tomorrow and has to drive there and is concerned.  I advised if she is feeling unsteady, worried, she should not drive and should either have someone driver or reschedule her appt until she gets to feeling back to normal.  Patient took 100 mg last night but she is not planning on taking anything else today.  I let her know I would discuss with Dr. Jaynee Eagles and give her a call back as soon as possible.  Patient was very [employer].

## 2021-12-20 NOTE — Addendum Note (Signed)
Addended by: Gildardo Griffes on: 12/20/2021 03:48 PM   Modules accepted: Orders

## 2021-12-20 NOTE — Telephone Encounter (Signed)
Pt has called to report: Dizzyness, blurry vision, and being off balance from increase dose of pregabalin (LYRICA) 50 MG capsule .  Pt is asking for a call to discuss.

## 2021-12-21 ENCOUNTER — Encounter (HOSPITAL_BASED_OUTPATIENT_CLINIC_OR_DEPARTMENT_OTHER): Payer: Self-pay | Admitting: Physical Therapy

## 2021-12-21 ENCOUNTER — Other Ambulatory Visit: Payer: Self-pay

## 2021-12-21 ENCOUNTER — Ambulatory Visit (HOSPITAL_BASED_OUTPATIENT_CLINIC_OR_DEPARTMENT_OTHER): Payer: Medicare HMO | Admitting: Physical Therapy

## 2021-12-21 DIAGNOSIS — M25561 Pain in right knee: Secondary | ICD-10-CM

## 2021-12-21 DIAGNOSIS — G8929 Other chronic pain: Secondary | ICD-10-CM

## 2021-12-21 DIAGNOSIS — R262 Difficulty in walking, not elsewhere classified: Secondary | ICD-10-CM

## 2021-12-21 DIAGNOSIS — M6281 Muscle weakness (generalized): Secondary | ICD-10-CM

## 2021-12-21 NOTE — Therapy (Signed)
OUTPATIENT PHYSICAL THERAPY LOWER EXTREMITY EVALUATION   Patient Name: Holly Fields MRN: [id number] DOB:1941/10/22, 81 y.o., female Today's Date: 12/21/2021   PT End of Session - 12/21/21 0951     Visit Number 7    Number of Visits 13    Date for PT Re-Evaluation 01/31/22    Authorization Type AETNA    PT Start Time 0947    PT Stop Time 1029    PT Time Calculation (min) 42 min    Activity Tolerance Patient tolerated treatment well    Behavior During Therapy WFL for tasks assessed/performed               Past Medical History:  Diagnosis Date   Angio-edema    Arthritis    CKD (chronic kidney disease), stage III ([place])     saw dr Florene Glen 2 years ago, now released from nephrology   Complication of anesthesia 2010   "sluggish after anesthesia and had vertigo", slow to awaken after knee artroscopy 2012   Diabetes ([place])    Eczema    at times   GERD (gastroesophageal reflux disease)    HTN (hypertension)    Hypercholesteremia    Lymphedema of leg    right more than RIGHT   Narrowing of airway 2010   Neuropathy    Numbness and tingling of right leg    Osteoarthritis    Osteopenia    Osteoporosis    PONV (postoperative nausea and vomiting)    pt has n/v and vertigo after anesthesia   PSVT (paroxysmal supraventricular tachycardia) (HCC)    Recurrent upper respiratory infection (URI)    Sinus tachycardia    Vertigo    at times, cannot turn on left side quickly or sleep on left side   Vitamin D deficiency    Past Surgical History:  Procedure Laterality Date   arthroscopic knee surgery Left 2012   CARDIAC CATHETERIZATION  2012   JOINT REPLACEMENT Right 2010   LUMBAR LAMINECTOMY/DECOMPRESSION MICRODISCECTOMY N/A 10/22/2013   Procedure: MICROLUMBAR DECOMPRESSION LUMBAR TWO TO THREE, LUMBAR THREE TO FOUR and faramenotomy two,threeand four ,five bilateral;  Surgeon: Johnn Hai, MD;  Location: [facility];  Service: Orthopedics;  Laterality: N/A;   NASAL SINUS SURGERY   yrs ago    x 2   TOTAL KNEE ARTHROPLASTY Left 03/01/2015   Procedure: LEFT TOTAL KNEE ARTHROPLASTY;  Surgeon: Gaynelle Arabian, MD;  Location: [facility];  Service: Orthopedics;  Laterality: Left;   Patient Active Problem List   Diagnosis Date Noted   Seasonal and perennial allergic rhinitis 05/10/2021   Dry skin 05/10/2021   Lymphedema 11/06/2017   PSVT (paroxysmal supraventricular tachycardia) (HCC)    Sinus tachycardia    HTN (hypertension)    Osteoporosis without current pathological fracture 04/13/2017   OA (osteoarthritis) of knee 03/01/2015   Preoperative cardiovascular examination 01/05/2015   Spinal stenosis of lumbar region 10/22/2013    PCP: Leeroy Cha, MD  REFERRING PROVIDER: Melvenia Beam, MD  REFERRING DIAG: G62.9 (ICD-10-CM) - Polyneuropathy I89.0 (ICD-10-CM) - Lymphedema R26.9 (ICD-10-CM) - Gait abnormality M79.604,M79.605 (ICD-10-CM) - Pain in both lower extremities   THERAPY DIAG:  Chronic pain of right knee  Chronic pain of left knee  Muscle weakness (generalized)  Difficulty in walking, not elsewhere classified  ONSET DATE: Chronic / MD order/visit 10/10/2021  SUBJECTIVE:   SUBJECTIVE STATEMENT: "I stopped taking the medicine Monday night, felt loopy with blurred vision.  Called MD and she is adjusting dose (lyrica).  I am not  taking any of it until I feel like it is out of my system.  My husband brought me here today.  My pain has returned/increased"        PERTINENT HISTORY: -Chronic condition; MD script indicated aquatherapy at [facility] for gait abnormality, difficulty walking, and extensive edema/lymphedema. -DM type 2, lumbar spinal stenosis, neuropathy, lymphedema in the legs, chronic kidney disease stage III.  Osteoporosis paroxysmal supraventricular tachycardia (had one occasion and is on meds). -PSHx:  bilat knee replacements R: 2010, L: 2016 and lumbar spinal decompression and laminectomy surgery (2017)     PAIN:  Are you having  pain? Yes VAS 8/10 Pain location: Bilat knees > thighs and distal LEs.  Bilat hips. Pain description: numbness and tightness Aggravating factors: community ambulation, shopping Relieving factors: Lyrica, Tylenol  PRECAUTIONS: Other: chronic condition, lymphadema, neuropathy, osteoporosis   PLOF: Independent. Pt uses a cane in community ambulation for 1 year.  PATIENT GOALS Improve mobility.  Ambulate without cane.  Improve balance.   OBJECTIVE:   DIAGNOSTIC FINDINGS:  IMPRESSION: 1. Postoperative changes at L2-3 and L3-4 with wide decompressive laminectomies. 2. Moderate-sized disc extrusion at L3-4 as discussed above. There is mild mass effect on the ventral thecal sac and bilateral lateral recess encroachment. Mild bilateral foraminal encroachment also. 3. Multifactorial mild spinal and moderate bilateral lateral recess stenosis at L4-5. There is also bilateral foraminal stenosis     Observation:  Pt has swelling in bilat LEs.    TODAY'S TREATMENT:  Pt seen for aquatic therapy today.  Treatment took place in water 3.5 ft depth at the [facility] pool. Temp of water was 94 degrees.  Pt entered(backwards)/exited the pool via stairs step to independently with bilat rail.   -Reviewed current function, response to prior Rx, and pain level. Walking fwd, bwd and side stepping 3-4 widths each.  Seated bicycles x20 reps - flutter kicks 2x 25 reps;add/abd 3x20 -STS from 3rd water step 2x7 -Marching 2x10 reps with bilat UE assist on wall -standing heel and toe raises 20 reps with bilat UE assist on wall -standing hip abd 2x10 reps bilat with bilat UE assist on wall Balance -feet together and semi tandem both with vision then VE able to hold >15 seconds -SLS x 10 seconds gained with "swimmy arms" R/L with more tries on left   Pt requires buoyancy for support and to offload joints with strengthening exercises. Viscosity of the water is needed for resistance of  strengthening; water current perturbations provides challenge to standing balance unsupported, requiring increased core activation.   PATIENT EDUCATION:  Education details: Educated pt in [place].  Answered Pt's questions.  Exercise form.  Person educated: Patient  Education method: Explanation, demonstration, verbal and tactile cues, and handout Education comprehension: verbalized understanding, demonstration, verbal and tactile cues required.   HOME EXERCISE PROGRAM: Access Code: [email or web address] URL: [email or web address] Date: 11/23/2021 Prepared by: Ronny Flurry Added aquatics 12/09/21 FZ   Exercises Supine Short Arc Quad - 1-2 x daily - 5-7 x weekly - 2 sets - 10 reps Seated March - 1 x daily - 7 x weekly - 2 sets - 10 reps Supine Hip Abduction AROM - 1 x daily - 7 x weekly - 2 sets - 5 reps Seated Hip Abduction with Resistance - 1 x daily - 3-4 x weekly - 2 sets - 10 reps Hooklying Clamshell with Resistance - 1 x daily - 3-4 x weekly - 2 sets - 10 reps Standing Hip Abduction Adduction at [place] - 1 x  daily - 7 x weekly - 3 sets - 10 reps Standing Hip Flexion Extension at [employer] - 1 x daily - 7 x weekly - 3 sets - 10 reps Heel Toe Raises at [place] - 1 x daily - 7 x weekly - 3 sets - 10 reps Standing March at [facility] - 1 x daily - 7 x weekly - 3 sets - 10 reps   ASSESSMENT:  CLINICAL IMPRESSION: Increased sx due to stopping med for reasons of undesirable side effects. Pt hoping MD can adjust as med clearly help with pain.  She is using cane to assist with safe walking.  Pt demonstrates improvement with weight shifting and execution with STS from 3rd step. She reports she has had success standing from chairs at home and in church without difficulty. Advanced balance training today completed in 3 ft.  Pt reports decreased tightness in knee and calves/ankles upon completion of session today.  She continues to improve with aquatic therapy intervention Re-certificaion  completed today.  Foto without progression may be secondary to increase in ptsx today as noted above. She has made progress in all areas meeting all STG and 1 LTG.  LE strength improvements with add/abd = L/R, Right hip flex 4/5, left 4+/5, knee bilat 4+/5.  Her overall pain has decreased with main complaint of tightness which is probably due to excessive edema. She reports indep with functional mobility in home environment just needing some extra time. She has been driving herself to therapy getting in and out of the car without difficulty.  Not all goals have been met nor has she met her maximal potential.  She will benefit from continued therapy on land and aquatic for 6 more weeks.     Objective impairments include Abnormal gait, decreased activity tolerance, decreased balance, decreased endurance, decreased mobility, difficulty walking, decreased strength, increased edema, impaired flexibility, and pain. These impairments are limiting patient from cleaning, community activity, shopping, and transfers, and ambulation . Personal factors including Time since onset of injury/illness/exacerbation and 3+ comorbidities: DM Type 2, lumbar spinal stenosis with lumbar surgery, Osteoporosis, and bilat TKR.  are also affecting patient's functional outcome.   REHAB POTENTIAL: Good  CLINICAL DECISION MAKING: Evolving/moderate complexity  EVALUATION COMPLEXITY: Moderate   GOALS:  SHORT TERM GOALS:  STG Name Target Date Goal status  1 Pt will tolerate aquatic therapy without adverse effects for improved tolerance to activity and daily mobility.  Baseline:  11/23/2021 12/21/21 INITIAL Achieved  2 Pt will report improved balance and stability with ambulation and her daily standing activities.   Baseline:  11/30/2021 12/21/21 INITIAL Achieved  3 Pt will report at least a 25% improvement overall in pain and 40% improvement in daily mobility.  Baseline: 12/07/2021 12/21/21 INITIAL Achieved                        LONG TERM GOALS:   LTG Name Target Date Goal status  1 Pt will demo improved strength to 4/5 in bilat hip flexion, 4+/5 in bilat knees, and improved R hip abd to = L LE for improved performance of functional mobility skills.   Baseline: 12/21/2021 12/21/21 INITIAL Ongoing  2 Pt will be able to perform car transfers without difficulty.   Baseline: 12/21/2021 12/21/21 INITIAL Achieved  3 Pt will be able to ambulate community distance without significant pain or difficulty.  Baseline: 12/21/2021 12/21/21  INITIAL Ongoing  4 Pt will report improved standing tolerance with daily activities  and household activities.  Baseline: 12/21/2021 12/21/21 INITIAL Ongoing  5 Pt will be able to ascend and descend 1-2 steps without significant difficulty for improved ability to ascend/descend curb.  Baseline: 12/21/2021 12/21/21 INITIAL Ongoing  6 Pt will be independent with aquatic HEP for improved strength, tolerance to activity, and performance of daily mobility Baseline: 12/21/2021 12/21/21 INITIAL Ongoing        PLAN:   PLANNED INTERVENTIONS: Therapeutic exercises, Therapeutic activity, Neuro Muscular re-education, Balance training, Gait training, Patient/Family education, Stair training, Aquatic Therapy, Dry Needling, Electrical stimulation, Cryotherapy, Moist heat, Taping, Ultrasound, and Manual therapy  PLAN FOR NEXT SESSION: Cont with Aquatic Therapy and land therapy   Holly Fields) Holly Fields MPT 12/21/21 1:36 PM

## 2022-01-09 ENCOUNTER — Encounter (HOSPITAL_BASED_OUTPATIENT_CLINIC_OR_DEPARTMENT_OTHER): Payer: Medicare HMO | Admitting: Physical Therapy

## 2022-01-25 NOTE — Telephone Encounter (Signed)
Last Prolia inj 11/22/21 ?Next Prolia inj due 05/24/22 ?

## 2022-02-16 ENCOUNTER — Encounter: Payer: Self-pay | Admitting: Adult Health

## 2022-02-16 ENCOUNTER — Ambulatory Visit (INDEPENDENT_AMBULATORY_CARE_PROVIDER_SITE_OTHER): Payer: Medicare HMO | Admitting: Adult Health

## 2022-02-16 VITALS — BP 136/74 | HR 80 | Ht 62.0 in | Wt 223.0 lb

## 2022-02-16 DIAGNOSIS — I890 Lymphedema, not elsewhere classified: Secondary | ICD-10-CM | POA: Diagnosis not present

## 2022-02-16 DIAGNOSIS — G629 Polyneuropathy, unspecified: Secondary | ICD-10-CM | POA: Diagnosis not present

## 2022-02-16 DIAGNOSIS — L309 Dermatitis, unspecified: Secondary | ICD-10-CM | POA: Diagnosis not present

## 2022-02-16 MED ORDER — BETAMETHASONE DIPROPIONATE 0.05 % EX CREA
TOPICAL_CREAM | Freq: Two times a day (BID) | CUTANEOUS | 0 refills | Status: DC
Start: 2022-02-16 — End: 2022-10-03

## 2022-02-16 NOTE — Progress Notes (Signed)
?[facility] ? ? ? ?Provider:  Dr Jaynee Eagles ?Requesting Provider: Leeroy Cha,* ?Primary Care Provider:  Leeroy Cha, MD ? ? ?Chief Complaint  ?Patient presents with  ? Follow-up  ?  RM 3 alone   ?Pt is well, states her pain has slightly improved since last visit. She sleeps better and pain is not as intense.   ?  ? ?HPI:  Holly Fields is a 81 y.o. female here as requested by Leeroy Cha,* for neuropathy. PMHx DM2, lumbar spinal stenosis, neuropathy, lymphedema in the legs, CKD.  ? ? ? ?Update 02/16/2022 JM: Patient returns for nerve pain which has been progressively worsening. Was seen by Dr. Jaynee Eagles 4 months ago, started pregabalin with initial great benefit but due to worsening symptoms after approx 1 month, recommend for dosage to be increased but reported side effects of dizziness, "shaky" and blurred vision. Of note, starting dose recommended 50 mg twice daily and with increase 13m twice daily, side effects started but apparently was only taking 50 mg nightly to begin with.  She is currently taking 520mnightly.  To try to take 50 mg twice daily but had recurrence of similar side effects. Feels like lyrica has been helping, able to sleep better and longer, pain not as intense.  Continues to experience temperature changes of lower extremities which worsens with increased swelling.  She will occasionally use wraps and lymphedema pumps. Was working with aquatic therapy at [facility] but due to difficulty scheduling appointments, she has since been doing her own exercises at a [facility] report benefit in regards to balance but c/o bilateral knee pain which can worsen after aquatic exercise. Also mentions a few medium sized areas on her hands, arms and legs that are itching as well as generalized dry skin. ? ?Lab work to look for reversible causes including vit B1, vit b6, vit b12, heavy metals, methylmalonic acid and multiple myeloma panel largely  unremarkable. ? ?No further concerns at this time. ? ? ? ? ? ?History provided for reference purposes only ?Update 10/10/2021 Dr. AhJaynee Eaglespatient is here again for nerve pain again and unfortunately not much we may be able to do for her. We evaluated her in the past for this in 09/2020 when she stated she had been "everywhere" for this. 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 She has worsening lymphedema in the legs/swelling. She has stinging with the swelling, if she did not have her stockings on in the morning she could not walk, it has moved up from her feet to her thighs, aching and pain and her feet feel freeing cold. Years ago she went to vascular, she says her feet actually are cold to the touch, she went to vascular and they said it's lymphedema, they feel cold to the touch, recommend maybe trying to get evaluated for circulatory problems. She has more sciatic pain from her back to her butt to her hip. She hsa a leg brace and was able to tolerate it and it eased up. Her walking is off balance due to er leg pain and swelling and knee pain. She has been to lymphedema clinic. She has numbness, tingling, burning and tightness and numbness when the swelling is bad. Last HgbA1c around 7. Stockings help the pain in the feet. Otherwise is excruciating. No other significant low back pain. Worst  at night, aching, throbbing and in the early morning to the upper legs.  ? ?Consult visit 09/2020 Dr. Jaynee Eagles: She is here with ehr husband. She has pain in her feet due to neuropathy, arthritis, lymphedema, spinal stenosis, she has been "everywhere" and had lymphedema therapy, pain management, she is here with ehr husband who also provides information. She has burning, stinging keeping her awake at night, she has had 2  knee replacements and it "settles" in my knees but the pain and stiffness occurs in her knees and makes her imbalanced. She has imbalance due to her knees, her lymphedema. She has been to her knee doctor. She has her regular check ups and she sees Dr. Tamala Julian. She has started Nerve Renew and she is feeling so much better. She used to wake up 230am with burning and aching, she would get up, nothing was comfortable and sleep deprived. Where she is now is the Lymphedema goes in cycles. She has excruciating pain in her legs with the lymphedema, putting socks on helps, during the day she does well in terms of burning pain. Ongoing for years and progressive. She will take a 618m Tylenol and that helps. She feels she has a very high pain tolerance. She has had daily pain for many years. She wakes with numb hands, worse at night. No other focal neurologic deficits, associated symptoms, inciting events or modifiable factors. ? ?Meds tried: tramadol, hydrocodone, Gabapentin (reaction),  ? ?Reviewed notes, labs and imaging from outside physicians, which showed: ? ?Labs 03/31/2020: cbc unremarkable, cmp BUN 23 creat 1.60, hgba1c 6.7 ? ?Reviewed MRI lumbar spine images and agree with the following: IMPRESSION: ?1. Postoperative changes at L2-3 and L3-4 with wide decompressive ?laminectomies. ?2. Moderate-sized disc extrusion at L3-4 as discussed above. There ?is mild mass effect on the ventral thecal sac and bilateral lateral ?recess encroachment. Mild bilateral foraminal encroachment also. ?3. Multifactorial mild spinal and moderate bilateral lateral recess ?stenosis at L4-5. There is also bilateral foraminal stenosis. ? ? ? ? ?Review of Systems: ?Patient complains of symptoms per HPI as well as the following symptoms: leg pain . Pertinent negatives and positives per HPI. All others negative ? ? ? ?Social History  ? ?Socioeconomic History  ? Marital status: Married  ?  Spouse name: Not on file  ? Number of children: 1  ? Years of  education: Not on file  ? Highest education level: Not on file  ?Occupational History  ?  Comment: retired  ?Tobacco Use  ? Smoking status: Former  ?  Packs/day: 0.25  ?  Years: 15.00  ?  Pack years: 3.75  ?  Types: Cigarettes  ?  Quit date: 12/04/1976  ?  Years since quitting: 45.2  ? Smokeless tobacco: Never  ?Vaping Use  ? Vaping Use: Never used  ?Substance and Sexual Activity  ? Alcohol use: Yes  ?  Comment: very occasional wine  ? Drug use: No  ? Sexual activity: Not on file  ?Other Topics Concern  ? Not on file  ?Social History Narrative  ? Caffeine- 1 drink 3 times per week (usually drinks decaf)  ? Left handed   ? ?Social Determinants of Health  ? ?Financial Resource Strain: Not on file  ?Food Insecurity: Not on file  ?Transportation Needs: Not on file  ?Physical Activity: Not on file  ?Stress: Not on file  ?Social Connections: Not on file  ?Intimate Partner Violence: Not on file  ? ? ?Family History  ?Problem Relation Age of Onset  ?  Arrhythmia Mother   ? Hyperlipidemia Mother   ? Hypertension Mother   ? Allergic rhinitis Mother   ? Arrhythmia Father   ? Heart attack Father   ? Heart failure Father   ? ? ?Past Medical History:  ?Diagnosis Date  ? Angio-edema   ? Arthritis   ? CKD (chronic kidney disease), stage III ([place])   ?  saw dr Florene Glen 2 years ago, now released from nephrology  ? Complication of anesthesia 2010  ? "sluggish after anesthesia and had vertigo", slow to awaken after knee artroscopy 2012  ? Diabetes ([place])   ? Eczema   ? at times  ? GERD (gastroesophageal reflux disease)   ? HTN (hypertension)   ? Hypercholesteremia   ? Lymphedema of leg   ? right more than RIGHT  ? Narrowing of airway 2010  ? Neuropathy   ? Numbness and tingling of right leg   ? Osteoarthritis   ? Osteopenia   ? Osteoporosis   ? PONV (postoperative nausea and vomiting)   ? pt has n/v and vertigo after anesthesia  ? PSVT (paroxysmal supraventricular tachycardia) ([place])   ? Recurrent upper respiratory infection (URI)   ? Sinus  tachycardia   ? Vertigo   ? at times, cannot turn on left side quickly or sleep on left side  ? Vitamin D deficiency   ? ? ?Patient Active Problem List  ? Diagnosis Date Noted  ? Seasonal and perennial allergic

## 2022-02-16 NOTE — Patient Instructions (Addendum)
Your Plan: ? ?Continue Lyrica 50mg  nightly  ? ?Referral placed to vascular surgery for evaluation of your lymphedema ? ?Use of topical ointment for eczema - please ensure you follow up with PCP for further treatment and evaluation  ? ? ? ? ? ? ? ?Thank you for coming to see at [facility]. I hope we have been able to provide you high quality care today. ? ?You may receive a patient satisfaction survey over the next few weeks. We would appreciate your feedback and comments so that we may continue to improve ourselves and the health of our patients. ? ?

## 2022-02-27 ENCOUNTER — Telehealth: Payer: Self-pay | Admitting: Adult Health

## 2022-02-27 DIAGNOSIS — I890 Lymphedema, not elsewhere classified: Secondary | ICD-10-CM

## 2022-02-27 NOTE — Telephone Encounter (Signed)
It was sent to [facility] location?  ?

## 2022-02-27 NOTE — Telephone Encounter (Signed)
Can you put a new Vascular referral in. The patient left a voicemail on my phone wanting [facility].  ?

## 2022-03-01 NOTE — Telephone Encounter (Signed)
Can you put a new referral in for the [facility] Location.  ?

## 2022-03-01 NOTE — Addendum Note (Signed)
Addended by: Ihor Austin L on: 03/01/2022 09:25 AM ? ? Modules accepted: Orders ? ?

## 2022-03-28 ENCOUNTER — Other Ambulatory Visit: Payer: Self-pay

## 2022-03-28 DIAGNOSIS — I890 Lymphedema, not elsewhere classified: Secondary | ICD-10-CM

## 2022-04-11 ENCOUNTER — Ambulatory Visit (HOSPITAL_COMMUNITY)
Admission: RE | Admit: 2022-04-11 | Discharge: 2022-04-11 | Disposition: A | Discharge status: Home / Self Care | Payer: Medicare HMO | Source: Ambulatory Visit | Attending: Vascular Surgery | Admitting: Vascular Surgery

## 2022-04-11 ENCOUNTER — Ambulatory Visit (INDEPENDENT_AMBULATORY_CARE_PROVIDER_SITE_OTHER): Payer: Medicare HMO | Admitting: Vascular Surgery

## 2022-04-11 ENCOUNTER — Encounter: Payer: Self-pay | Admitting: Vascular Surgery

## 2022-04-11 VITALS — BP 114/73 | HR 89 | Temp 97.9°F | Resp 14 | Ht 62.0 in | Wt 218.0 lb

## 2022-04-11 DIAGNOSIS — I890 Lymphedema, not elsewhere classified: Secondary | ICD-10-CM

## 2022-04-11 NOTE — Progress Notes (Signed)
? ? ?Patient name: Holly Fields MRN: [id number] DOB: [birth year] Sex: female ? ?REASON FOR CONSULT: Evaluate lower extremity circulation ? ?HPI: ?Holly Fields is a 81 y.o. female, with history of chronic kidney disease stage III, diabetes, hypertension, hyperlipidemia presents for evaluation of lower extremity circulation.  Patient states she was diagnosed with lymphedema around 2010 after she had knee surgery.  She has dealt with the leg swelling of her bilateral lower lower extremities for years.  She is currently using Flexitouch compressive therapy that uses wraps with a pneumatic pump.  Ultimately she states over the last year she has had a hot cold feeling to her legs and wanted to make sure she was not developing other circulatory problems.  She does wear compression stockings as well.  Symptoms are usually worse when her legs swell. ? ?Past Medical History:  ?Diagnosis Date  ? Angio-edema   ? Arthritis   ? CKD (chronic kidney disease), stage III ([place])   ?  saw dr Florene Glen 2 years ago, now released from nephrology  ? Complication of anesthesia 2010  ? "sluggish after anesthesia and had vertigo", slow to awaken after knee artroscopy 2012  ? Diabetes ([place])   ? Eczema   ? at times  ? GERD (gastroesophageal reflux disease)   ? HTN (hypertension)   ? Hypercholesteremia   ? Lymphedema of leg   ? right more than RIGHT  ? Narrowing of airway 2010  ? Neuropathy   ? Numbness and tingling of right leg   ? Osteoarthritis   ? Osteopenia   ? Osteoporosis   ? PONV (postoperative nausea and vomiting)   ? pt has n/v and vertigo after anesthesia  ? PSVT (paroxysmal supraventricular tachycardia) ([place])   ? Recurrent upper respiratory infection (URI)   ? Sinus tachycardia   ? Vertigo   ? at times, cannot turn on left side quickly or sleep on left side  ? Vitamin D deficiency   ? ? ?Past Surgical History:  ?Procedure Laterality Date  ? arthroscopic knee surgery Left 2012  ? CARDIAC CATHETERIZATION  2012  ? JOINT REPLACEMENT Right  2010  ? LUMBAR LAMINECTOMY/DECOMPRESSION MICRODISCECTOMY N/A 10/22/2013  ? Procedure: MICROLUMBAR DECOMPRESSION LUMBAR TWO TO THREE, LUMBAR THREE TO FOUR and faramenotomy two,threeand four ,five bilateral;  Surgeon: Johnn Hai, MD;  Location: [facility];  Service: Orthopedics;  Laterality: N/A;  ? NASAL SINUS SURGERY  yrs ago  ?  x 2  ? TOTAL KNEE ARTHROPLASTY Left 03/01/2015  ? Procedure: LEFT TOTAL KNEE ARTHROPLASTY;  Surgeon: Gaynelle Arabian, MD;  Location: [facility];  Service: Orthopedics;  Laterality: Left;  ? ? ?Family History  ?Problem Relation Age of Onset  ? Arrhythmia Mother   ? Hyperlipidemia Mother   ? Hypertension Mother   ? Allergic rhinitis Mother   ? Arrhythmia Father   ? Heart attack Father   ? Heart failure Father   ? ? ?SOCIAL HISTORY: ?Social History  ? ?Socioeconomic History  ? Marital status: Married  ?  Spouse name: Not on file  ? Number of children: 1  ? Years of education: Not on file  ? Highest education level: Not on file  ?Occupational History  ?  Comment: retired  ?Tobacco Use  ? Smoking status: Former  ?  Packs/day: 0.25  ?  Years: 15.00  ?  Pack years: 3.75  ?  Types: Cigarettes  ?  Quit date: 12/04/1976  ?  Years since quitting: 45.3  ? Smokeless tobacco: Never  ?  Vaping Use  ? Vaping Use: Never used  ?Substance and Sexual Activity  ? Alcohol use: Yes  ?  Comment: very occasional wine  ? Drug use: No  ? Sexual activity: Not on file  ?Other Topics Concern  ? Not on file  ?Social History Narrative  ? Caffeine- 1 drink 3 times per week (usually drinks decaf)  ? Left handed   ? ?Social Determinants of Health  ? ?Financial Resource Strain: Not on file  ?Food Insecurity: Not on file  ?Transportation Needs: Not on file  ?Physical Activity: Not on file  ?Stress: Not on file  ?Social Connections: Not on file  ?Intimate Partner Violence: Not on file  ? ? ?Allergies  ?Allergen Reactions  ? Adenosine   ?   bp plummets  ? Augmentin [Amoxicillin-Pot Clavulanate] Hives  ? Codeine Nausea And Vomiting  ?  Doxycycline Hives  ? Fosamax [Alendronate] Swelling  ?  Lips swell  ? Gabapentin Other (See Comments)  ?  "psychotic" reactions  ? Hydrocodone-Acetaminophen   ?  "out of it"  ? Hydromorphone Hcl Nausea And Vomiting  ? Lactose Intolerance (Gi)   ? Metformin And Related   ?  diarrhea  ? Other Other (See Comments)  ? Oxycodone Nausea And Vomiting  ? Sulfa Antibiotics   ?  Acute kidney injury  ? Sulfamethoxazole-Trimethoprim Other (See Comments)  ? Tramadol Nausea And Vomiting  ? ? ?Current Outpatient Medications  ?Medication Sig Dispense Refill  ? aspirin 81 MG tablet Take 81 mg by mouth daily.    ? atorvastatin (LIPITOR) 10 MG tablet Take 10 mg by mouth every morning.     ? Azelastine HCl 0.15 % SOLN Place 2 sprays into both nostrils 2 (two) times daily. 30 mL 5  ? betamethasone dipropionate 0.05 % cream Apply topically 2 (two) times daily. 30 g 0  ? cetirizine (ZYRTEC) 10 MG tablet     ? Cholecalciferol (VITAMIN D3) 1000 units CAPS Take 1 capsule by mouth 3 (three) times a week.     ? clindamycin (CLEOCIN) 150 MG capsule Take 150 mg by mouth as needed.    ? famotidine (PEPCID) 20 MG tablet 1 tablet at bedtime as needed    ? fexofenadine (ALLEGRA) 180 MG tablet Take 90 mg by mouth daily as needed for allergies. Takes 1/2 tablet    ? fluticasone (FLONASE) 50 MCG/ACT nasal spray Place 2 sprays into the nose as needed for allergies.    ? glucose blood (ONETOUCH VERIO) test strip See admin instructions.    ? guaiFENesin (MUCINEX) 600 MG 12 hr tablet     ? hydrochlorothiazide (HYDRODIURIL) 25 MG tablet Take 1 tablet (25 mg total) by mouth daily. 90 tablet 3  ? JANUVIA 50 MG tablet Take 50 mg by mouth every morning.     ? lansoprazole (PREVACID) 15 MG capsule Take 15 mg by mouth daily at 12 noon.    ? levocetirizine (XYZAL) 5 MG tablet Take 1 tablet (5 mg total) by mouth every evening. 30 tablet 5  ? metoprolol succinate (TOPROL-XL) 25 MG 24 hr tablet Take 1 tablet (25 mg total) by mouth daily. Take with or immediately  following a meal. 90 tablet 3  ? Misc Natural Products (NEURIVA PO) Take 1 capsule by mouth daily.    ? mometasone (NASONEX) 50 MCG/ACT nasal spray Place 2 sprays into the nose daily. Two sprays each in each nostril 17 g 5  ? NON FORMULARY Nervolink    ? NONFORMULARY OR  COMPOUNDED ITEM Place onto the skin. Topiramate 2.5%, Celecoxib 2%, Gabapentin 6%, and Lidocaine 3%. ? ?Apply 1-2 grams to the affected area 3-4 times daily.    ? OneTouch Delica Lancets [id number] MISC See admin instructions.    ? pregabalin (LYRICA) 50 MG capsule Take 50 mg by mouth daily at bedtime. When side effects have resolved increase to 50 mg twice daily. 60 capsule 2  ? Probiotic Product (ALIGN PO) Take by mouth.    ? Simethicone 125 MG CAPS     ? triamcinolone cream (KENALOG) 0.1 %   3  ? ?No current facility-administered medications for this visit.  ? ? ?REVIEW OF SYSTEMS:  ?[X]  denotes positive finding, [ ]  denotes negative finding ?Cardiac  Comments:  ?Chest pain or chest pressure:    ?Shortness of breath upon exertion:    ?Short of breath when lying flat:    ?Irregular heart rhythm:    ?    ?Vascular    ?Pain in calf, thigh, or hip brought on by ambulation:    ?Pain in feet at night that wakes you up from your sleep:     ?Blood clot in your veins:    ?Leg swelling:  x Bilateral  ?    ?Pulmonary    ?Oxygen at home:    ?Productive cough:     ?Wheezing:     ?    ?Neurologic    ?Sudden weakness in arms or legs:     ?Sudden numbness in arms or legs:     ?Sudden onset of difficulty speaking or slurred speech:    ?Temporary loss of vision in one eye:     ?Problems with dizziness:     ?    ?Gastrointestinal    ?Blood in stool:     ?Vomited blood:     ?    ?Genitourinary    ?Burning when urinating:     ?Blood in urine:    ?    ?Psychiatric    ?Major depression:     ?    ?Hematologic    ?Bleeding problems:    ?Problems with blood clotting too easily:    ?    ?Skin    ?Rashes or ulcers:    ?    ?Constitutional    ?Fever or chills:    ? ? ?PHYSICAL  EXAM: ?Vitals:  ? 04/11/22 1153  ?BP: 114/73  ?Pulse: 89  ?Resp: 14  ?Temp: 97.9 ?F (36.6 ?C)  ?TempSrc: Temporal  ?SpO2: 97%  ?Weight: 218 lb (98.9 kg)  ?Height: 5\' 2"  (1.575 m)  ? ? ?GENERAL: The patient is a well

## 2022-04-12 NOTE — Telephone Encounter (Signed)
Prolia VOB initiated via [email or web address] ? ?Last Prolia inj 11/22/21 ?Next Prolia inj due 05/24/22 ? ?PA expired 04/02/22 ?

## 2022-04-18 NOTE — Telephone Encounter (Signed)
Prior auth required for PROLIA  PA PROCESS DETAILS: Precertification is required. Call [phone] or complete the Precertification form available at [email or web address] 

## 2022-04-25 ENCOUNTER — Other Ambulatory Visit: Payer: Self-pay | Admitting: Allergy & Immunology

## 2022-05-01 NOTE — Telephone Encounter (Signed)
Prior Authorization initiated for [facility] via [employer] Case ID: [id number] Valid 05/01/22-05/01/23

## 2022-05-01 NOTE — Telephone Encounter (Signed)
Pt ready for scheduling on or after 05/24/22  Out-of-pocket cost due at time of visit: $0  Primary: [employer] Medicare Prolia co-insurance: 0% Admin fee co-insurance: 0%  Secondary: n/a Prolia co-insurance:  Admin fee co-insurance:   Deductible: does not apply  Prior Auth: APPROVED PA# [id number] Valid: 05/01/22-05/01/23    ** This summary of benefits is an estimation of the patient's out-of-pocket cost. Exact cost may very based on individual plan coverage.

## 2022-05-04 ENCOUNTER — Ambulatory Visit (INDEPENDENT_AMBULATORY_CARE_PROVIDER_SITE_OTHER): Payer: Medicare HMO | Admitting: Internal Medicine

## 2022-05-04 ENCOUNTER — Encounter: Payer: Self-pay | Admitting: Internal Medicine

## 2022-05-04 VITALS — BP 148/78 | HR 75 | Ht 62.0 in | Wt 223.0 lb

## 2022-05-04 DIAGNOSIS — M810 Age-related osteoporosis without current pathological fracture: Secondary | ICD-10-CM | POA: Diagnosis not present

## 2022-05-04 DIAGNOSIS — E1142 Type 2 diabetes mellitus with diabetic polyneuropathy: Secondary | ICD-10-CM | POA: Insufficient documentation

## 2022-05-04 DIAGNOSIS — E1165 Type 2 diabetes mellitus with hyperglycemia: Secondary | ICD-10-CM | POA: Diagnosis not present

## 2022-05-04 MED ORDER — RYBELSUS 7 MG PO TABS: 7.0000 mg | ORAL_TABLET | Freq: Every day | ORAL | 3 refills | Status: DC

## 2022-05-04 NOTE — Progress Notes (Signed)
Patient ID: Holly Fields, female   DOB: [birth year], 81 y.o.   MRN: [id number]   HPI  Holly Fields is a 81 y.o.-year-old female, initially referred by her PCP, Dr. Drema Dallas, returning for follow-up for osteoporosis.  She also would want [place] to address her DM2.  Patient was initially seen in 2018, after which she was lost for follow-up for 3.5 years.  She came back in 2021, but since then, she was again lost for follow-up... At today's visit, she comes accompanied by her husband.  Interim history: No falls or fractures since last visit. No dizziness, vertigo, disequilibrium. She does have neuropathy.  Osteoporosis:  She was diagnosed with osteoporosis in 2015.  Previously osteopenia.  Reviewed patient's DXA scan reports: 10/18/2020 Lumbar spine L1-L4 Femoral neck (FN)  T-score  -0.5 (<< -1.1) RFN: -1.8 LFN: -1.8   09/06/2018 ([facility]) Lumbar spine L1-L4 Femoral neck (FN)  T-score  -1.20 RFN: -2.20  LFN: -2.00  Change in BMD from previous DXA test (%) 16% increase  2% increase in RFN 5% increase in LFN   Date L1-L4 T score FN T score  09/04/2016 [facility]) -2.5 (-7.9%*) RFN: -2.3 LFN: -2.2  08/18/2014 ([place]) -1.8 RFN: -2.5 LFN: -2.1   No fractures or falls since last visit.  She denies dizziness/vertigo/orthostasis/poor vision.  Previous osteoporosis treatments:  - Fosamax >> started 10/2016 >> swelling on tongue, lips + dysphagia + skin rash (painful) + generalized pain - She has 3 Prolia injections before our last visit, and afterwards: 05/15/2017 11/21/2017 07/03/2018 02/27/2019 09/10/2019 04/07/2020 11/02/2020  05/13/2021 11/22/2021  No vitamin D deficiency: 10/11/2020: 54.2 08/19/2019:  45 08/29/2016: 48.2 04/25/2016: 53.1 08/26/2015: 45.9 Lab Results  Component Value Date   VD25OH 46.50 04/12/2017   She is on vitamin D 2000 units daily  She does not take high vitamin A doses.  She had multiple steroid injections in the knees stopped in 2016,  when she had the last TKR.  She also had surgery for spinal stenosis and continues to have sciatica pain and numbness.  She also has lymphedema.  Her legs hurt at night.  She has been seen in the pain clinic before.  She had disequilibrium, now improved.  Menopause was at 81 years old.  She does not have a family history of osteoporosis.    No history of kidney stones.  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  She sees  sees Dr. Pearson Grippe with nephrology.    Latest BUN/creatinine: 03/27/2022: 31/1.93, GFR 26, glucose 149 10/11/2020: BUN/Cr 24/1.68, GFR 36, ACR <7.4, Glu 103 08/19/2019: CMP normal with the exception of: Glucose 100, BUN/creatinine 22/1.59, GFR 38, ACR <11.9 Lab Results  Component Value Date   BUN 21 06/14/2018   CREATININE 1.59 (H) 06/14/2018  08/2016: 25/1.52; eGFR 41  DM2: - dx. In  5284 - Complicated by peripheral neuropathy (lymphedema may contribute) - saw neurology (Dr. Jaynee Eagles) - started Lyrica 50 (could not tolerate 100 mg)  She is on: - Januvia 50 mg daily - Farxiga 5 mg daily in a.m. - started in 03/2022, but had increased urination >> stopped >> saw nephrology last week - he advised her to stop 2/2 potential for UTIs /yeast infections.  HbA1c levels reviewed: 03/27/2022: HbA1c 7.2% 07/15/2021:  HbA1c 7.0% 10/11/2020: HbA1c 6.8% No results found for: HGBA1C  She checks CBGs in am:  - am: 130s-144 - 2h after b'fast: n/c - lunch: n/c - 2h after lunch: n/c - dinner: n/c - 2h after dinner: n/c - bedtime: n/c  Meter: One Touch Verio  +HL: 09/14/2021: 195/340/46/92 Lab Results  Component Value Date   CHOL 149 09/20/2018   HDL 51 09/20/2018   LDLCALC 68 09/20/2018   TRIG 146 09/20/2018  She is on Lipitor 10 mg daily.  Latest eye exam: 2023: No DR reportedly.Had cataract sx.  She has peripheral neuropathy and is on Lyrica 50 mg daily.  She sees neurology.  She also has a history of GERD with history of reflux esophagitis.  Also, fatigue, constipation. No history of pancreatitis.  ROS: + See HPI  I reviewed pt's medications, allergies, PMH, social hx, family hx:  Past Medical History:  Diagnosis Date   Angio-edema    Arthritis    CKD (chronic kidney disease), stage III ([place])     saw dr Florene Glen 2 years ago, now released from nephrology   Complication of anesthesia 2010   "sluggish after anesthesia and had vertigo", slow to awaken after knee artroscopy 2012   Diabetes ([place])    Eczema    at times   GERD (gastroesophageal reflux disease)    HTN (hypertension)    Hypercholesteremia    Lymphedema of leg    right more than RIGHT   Narrowing of airway 2010   Neuropathy    Numbness and tingling of right leg    Osteoarthritis    Osteopenia    Osteoporosis    PONV (postoperative nausea and vomiting)    pt has n/v and vertigo after anesthesia   PSVT (paroxysmal supraventricular tachycardia) ([facility])    Recurrent upper respiratory infection (URI)    Sinus tachycardia    Vertigo    at times, cannot turn on left side quickly or sleep on left side   Vitamin D deficiency    Past Surgical History:  Procedure Laterality Date   arthroscopic knee surgery Left 2012   CARDIAC CATHETERIZATION  2012   JOINT REPLACEMENT Right 2010   LUMBAR LAMINECTOMY/DECOMPRESSION  MICRODISCECTOMY N/A 10/22/2013   Procedure: MICROLUMBAR DECOMPRESSION LUMBAR TWO TO THREE, LUMBAR THREE TO FOUR and faramenotomy two,threeand four ,five bilateral;  Surgeon: Johnn Hai, MD;  Location: [facility];  Service: Orthopedics;  Laterality: N/A;   NASAL SINUS SURGERY  yrs ago    x 2   TOTAL KNEE ARTHROPLASTY Left 03/01/2015   Procedure: LEFT TOTAL KNEE ARTHROPLASTY;  Surgeon: Gaynelle Arabian, MD;  Location: [facility];  Service: Orthopedics;  Laterality: Left;   Social History   Socioeconomic History   Marital status: Married    Spouse name: Not on file   Number of children: 1   Years of education: Not on file   Highest education level: Not on file  Occupational History    Comment: retired  Tobacco Use   Smoking status: Former    Packs/day: 0.25    Years:  15.00    Pack years: 3.75    Types: Cigarettes    Quit date: 12/04/1976    Years since quitting: 45.4   Smokeless tobacco: Never  Vaping Use   Vaping Use: Never used  Substance and Sexual Activity   Alcohol use: Yes    Comment: very occasional wine   Drug use: No   Sexual activity: Not on file  Other Topics Concern   Not on file  Social History Narrative   Caffeine- 1 drink 3 times per week (usually drinks decaf)   Left handed    Social Determinants of Health   Financial Resource Strain: Not on file  Food Insecurity: Not on file  Transportation Needs: Not on file  Physical Activity: Not on file  Stress: Not on file  Social Connections: Not on file  Intimate Partner Violence: Not on file   Current Outpatient Medications on File Prior to Visit  Medication Sig Dispense Refill   aspirin 81 MG tablet Take 81 mg by mouth daily.     atorvastatin (LIPITOR) 10 MG tablet Take 10 mg by mouth every morning.      Azelastine HCl 0.15 % SOLN Place 2 sprays into both nostrils 2 (two) times daily. 30 mL 5   betamethasone dipropionate 0.05 % cream Apply topically 2 (two) times daily. 30 g 0   cetirizine (ZYRTEC) 10 MG tablet       Cholecalciferol (VITAMIN D3) 1000 units CAPS Take 1 capsule by mouth 3 (three) times a week.      clindamycin (CLEOCIN) 150 MG capsule Take 150 mg by mouth as needed.     famotidine (PEPCID) 20 MG tablet 1 tablet at bedtime as needed     fexofenadine (ALLEGRA) 180 MG tablet Take 90 mg by mouth daily as needed for allergies. Takes 1/2 tablet     fluticasone (FLONASE) 50 MCG/ACT nasal spray Place 2 sprays into the nose as needed for allergies.     glucose blood (ONETOUCH VERIO) test strip See admin instructions.     guaiFENesin (MUCINEX) 600 MG 12 hr tablet      hydrochlorothiazide (HYDRODIURIL) 25 MG tablet Take 1 tablet (25 mg total) by mouth daily. 90 tablet 3   JANUVIA 50 MG tablet Take 50 mg by mouth every morning.      lansoprazole (PREVACID) 15 MG capsule Take 15 mg by mouth daily at 12 noon.     levocetirizine (XYZAL) 5 MG tablet Take 1 tablet (5 mg total) by mouth every evening. 30 tablet 5   metoprolol succinate (TOPROL-XL) 25 MG 24 hr tablet Take 1 tablet (25 mg total) by mouth daily. Take with or immediately following a meal. 90 tablet 3   Misc Natural Products (NEURIVA PO) Take 1 capsule by mouth daily.     mometasone (NASONEX) 50 MCG/ACT nasal spray Place 2 sprays into the nose daily. Two sprays each in each nostril 17 g 5   NON FORMULARY Nervolink     NONFORMULARY OR COMPOUNDED ITEM Place onto the skin. Topiramate 2.5%, Celecoxib 2%, Gabapentin 6%, and Lidocaine 3%.  Apply 1-2 grams to the affected area 3-4 times daily.     OneTouch Delica Lancets 48J MISC See admin instructions.     pregabalin (LYRICA) 50 MG capsule Take 50 mg by mouth daily at bedtime. When side effects have resolved increase to 50 mg twice daily. 60 capsule 2   Probiotic Product (ALIGN PO) Take by mouth.     Simethicone 125 MG CAPS  triamcinolone cream (KENALOG) 0.1 %   3   No current facility-administered medications on file prior to visit.   Allergies  Allergen Reactions   Adenosine      bp  plummets   Augmentin [Amoxicillin-Pot Clavulanate] Hives   Codeine Nausea And Vomiting   Doxycycline Hives   Fosamax [Alendronate] Swelling    Lips swell   Gabapentin Other (See Comments)    "psychotic" reactions   Hydrocodone-Acetaminophen     "out of it"   Hydromorphone Hcl Nausea And Vomiting   Lactose Intolerance (Gi)    Metformin And Related     diarrhea   Other Other (See Comments)   Oxycodone Nausea And Vomiting   Sulfa Antibiotics     Acute kidney injury   Sulfamethoxazole-Trimethoprim Other (See Comments)   Tramadol Nausea And Vomiting   Family History  Problem Relation Age of Onset   Arrhythmia Mother    Hyperlipidemia Mother    Hypertension Mother    Allergic rhinitis Mother    Arrhythmia Father    Heart attack Father    Heart failure Father    PE: BP (!) 148/78 (BP Location: Left Arm, Patient Position: Sitting, Cuff Size: Normal)   Pulse 75   Ht 5' 2" (1.575 m)   Wt 223 lb (101.2 kg)   SpO2 98%   BMI 40.79 kg/m  Wt Readings from Last 3 Encounters:  05/04/22 223 lb (101.2 kg)  04/11/22 218 lb (98.9 kg)  02/16/22 223 lb (101.2 kg)   Constitutional: overweight, in NAD Eyes: PERRLA, EOMI, no exophthalmos ENT: moist mucous membranes, no thyromegaly, no cervical lymphadenopathy Cardiovascular: RRR, No MRG, ++ bilateral significant lymphedema Respiratory: CTA B Musculoskeletal: no deformities Skin: moist, warm, no rashes Neurological: no tremor with outstretched hands  Assessment: 1.  Osteoporosis  2. DM2, non-insulin-dependent  Plan: 1. Osteoporosis -Likely postmenopausal +/- steroid injections -We reviewed the latest 2 bone density reports.  Her scores were lower than -2.5 in 2017, but improved afterwards.  On the last bone density test, her T-scores have improved at all sites, significantly so at the level of the spine and right femoral neck, but there is also slight nonsignificant improvement at the level of the left femoral neck.  I explained to  the patient what the T-scores actually mean and how to look at the results. -Prolia appears to have worked well for her.  She has no fractures and the T-scores improved at last check. -She tolerates Prolia well, without thigh/hip/jaw pain.  She does have sciatica pain and it is difficult to discern whether there is hip pain unrelated to this, however, she does not describe any new pain symptoms. -She is at high risk for falls due to her neuropathy, sciatica, and significant leg swelling.  She walks with a cane.  Therefore, we will continue Prolia for now.  We again discussed that we can actually continue for more than 10 years if needed. -Her latest vitamin D level was normal in 2021.  We will repeat this today. -No falls since last visit.  She is aware of a fall precautions -Reviewed latest GFR, which was low, at 26 on 03/27/2022.  We discussed that Prolia is not contraindicated even with such a low GFR since it is not renally excreted, but we have to pay attention to the calcium level.  Her latest calcium level was elevated in 03/2022, due to secondary hyperparathyroidism in the setting of CKD. -We will need another DXA scan in 2 years from the previous -  this is due in 10/2022.  We will order this at next visit  2. DM2 - new problem for me -Patient continues on DPP 4 inhibitor low-dose.  We discussed that due to her decreased kidney function on the last BMP, she probably would need to use even a lower dose of Januvia.  She was tried on Farxiga, but she developed increased urination.  She does have a history of UTIs and bladder incontinence and she was advised by nephrology that an SGLT2 inhibitor may not be the best for her. - reviewed her blood sugars at home and they are higher than target in the morning.  She is not checking later in the day, but I did advise her to start, rotating check times. - Latest HbA1c was slightly higher, at 7.2% approximately 1 month ago.  We did not repeat the HbA1c today,  but will do so at next visit. - at today's visit, I suggested to try Rybelsus instead of Januvia and we discussed that we can use this even for patients with advanced CKD.  We will start at a low dose and increase as tolerated.  I am hoping that this is covered by her insurance.  An injectable GLP-1 receptor agonist would also be an option in the future.  We did discuss about the benefits and possible side effects of this class of medications.  I advised her how to take the medication correctly. - we discussed about CBG targets: Fasting 80-130, 2 hours postprandial: <180 - advised to check sugars at different times of the day - 1x a day, rotating check times - advised for yearly eye exams >> she is UTD - return to clinic in 4 months  - Total time spent for the visit: 40 minutes, in obtaining medical information from the chart, the patient, and her husband, reviewing her  previous labs, imaging evaluations, and treatments, reviewing her symptoms, counseling her about her conditions (please see the discussed topics above), and developing a plan to further investigate and treat them; she and her husband had a number of questions which I addressed.   Addendum: Patient did not stop at the lab...  Philemon Kingdom, MD PhD [facility] Endocrinology

## 2022-05-04 NOTE — Patient Instructions (Addendum)
Please stop Januvia and start: - Rybelsus 3.5 mg before b'fast  In 7 days, if you tolerate this well, increase the dose to 7 mg daily.  Please return in 4 months with your sugar log.   PATIENT INSTRUCTIONS FOR TYPE 2 DIABETES:  DIET AND EXERCISE Diet and exercise is an important part of diabetic treatment.  We recommended aerobic exercise in the form of brisk walking (working between 40-60% of maximal aerobic capacity, similar to brisk walking) for 150 minutes per week (such as 30 minutes five days per week) along with 3 times per week performing 'resistance' training (using various gauge rubber tubes with handles) 5-10 exercises involving the major muscle groups (upper body, lower body and core) performing 10-15 repetitions (or near fatigue) each exercise. Start at half the above goal but build slowly to reach the above goals. If limited by weight, joint pain, or disability, we recommend daily walking in a swimming pool with water up to waist to reduce pressure from joints while allow for adequate exercise.    BLOOD GLUCOSES Monitoring your blood glucoses is important for continued management of your diabetes. Please check your blood glucoses 2-4 times a day: fasting, before meals and at bedtime (you can rotate these measurements - e.g. one day check before the 3 meals, the next day check before 2 of the meals and before bedtime, etc.).   HYPOGLYCEMIA (low blood sugar) Hypoglycemia is usually a reaction to not eating, exercising, or taking too much insulin/ other diabetes drugs.  Symptoms include tremors, sweating, hunger, confusion, headache, etc. Treat IMMEDIATELY with 15 grams of Carbs: 4 glucose tablets  cup regular juice/soda 2 tablespoons raisins 4 teaspoons sugar 1 tablespoon honey Recheck blood glucose in 15 mins and repeat above if still symptomatic/blood glucose <100.  RECOMMENDATIONS TO REDUCE YOUR RISK OF DIABETIC COMPLICATIONS: * Take your prescribed MEDICATION(S) * Follow  a DIABETIC diet: Complex carbs, fiber rich foods, (monounsaturated and polyunsaturated) fats * AVOID saturated/trans fats, high fat foods, >2,300 mg salt per day. * EXERCISE at least 5 times a week for 30 minutes or preferably daily.  * DO NOT SMOKE OR DRINK more than 1 drink a day. * Check your FEET every day. Do not wear tightfitting shoes. Contact us if you develop an ulcer * See your EYE doctor once a year or more if needed * Get a FLU shot once a year * Get a PNEUMONIA vaccine once before and once after age 23 years  GOALS:  * Your Hemoglobin A1c of <7%  * fasting sugars need to be <130 * after meals sugars need to be <180 (2h after you start eating) * Your Systolic BP should be 140 or lower  * Your Diastolic BP should be 80 or lower  * Your HDL (Good Cholesterol) should be 40 or higher  * Your LDL (Bad Cholesterol) should be 100 or lower. * Your Triglycerides should be 150 or lower  * Your Urine microalbumin (kidney function) should be <30 * Your Body Mass Index should be 25 or lower    Please consider the following ways to cut down carbs and fat and increase fiber and micronutrients in your diet: - substitute whole grain for white bread or pasta - substitute brown rice for white rice - substitute 90-calorie flat bread pieces for slices of bread when possible - substitute sweet potatoes or yams for white potatoes - substitute humus for margarine - substitute tofu for cheese when possible - substitute almond or rice milk for  regular milk (would not drink soy milk daily due to concern for soy estrogen influence on breast cancer risk) - substitute dark chocolate for other sweets when possible - substitute water - can add lemon or orange slices for taste - for diet sodas (artificial sweeteners will trick your body that you can eat sweets without getting calories and will lead you to overeating and weight gain in the long run) - do not skip breakfast or other meals (this will slow  down the metabolism and will result in more weight gain over time)  - can try smoothies made from fruit and almond/rice milk in am instead of regular breakfast - can also try old-fashioned (not instant) oatmeal made with almond/rice milk in am - order the dressing on the side when eating salad at a restaurant (pour less than half of the dressing on the salad) - eat as little meat as possible - can try juicing, but should not forget that juicing will get rid of the fiber, so would alternate with eating raw veg./fruits or drinking smoothies - use as little oil as possible, even when using olive oil - can dress a salad with a mix of balsamic vinegar and lemon juice, for e.g. - use agave nectar, stevia sugar, or regular sugar rather than artificial sweateners - steam or broil/roast veggies  - snack on veggies/fruit/nuts (unsalted, preferably) when possible, rather than processed foods - reduce or eliminate aspartame in diet (it is in diet sodas, chewing gum, etc) Read the labels!  Try to read Dr. Katherina Right book: "Program for Reversing Diabetes" for other ideas for healthy eating.

## 2022-05-24 ENCOUNTER — Ambulatory Visit (INDEPENDENT_AMBULATORY_CARE_PROVIDER_SITE_OTHER): Payer: Medicare HMO

## 2022-05-24 DIAGNOSIS — M810 Age-related osteoporosis without current pathological fracture: Secondary | ICD-10-CM

## 2022-05-24 MED ORDER — DENOSUMAB 60 MG/ML ~~LOC~~ SOSY
60.0000 mg | PREFILLED_SYRINGE | Freq: Once | SUBCUTANEOUS | Status: AC
  Administered 2022-05-24: 60 mg via SUBCUTANEOUS

## 2022-05-24 NOTE — Progress Notes (Signed)
Patient verbally confirmed name, date of birth, and correct medication to be administered. Prolia Injection administered in right arm and pt tolerated well.  

## 2022-05-29 ENCOUNTER — Other Ambulatory Visit: Payer: Self-pay | Admitting: Neurology

## 2022-05-31 NOTE — Telephone Encounter (Signed)
I called pt and relayed that she was sent back to pcp.  She may get this from her pcp.  Last office note sent to family medical provider.  Does not need f/u appt (only as needed).  She has enough of medication to get to her pcp to see if will fill for her.  Will call back if needed.  I cancelled refill request at this time.

## 2022-05-31 NOTE — Telephone Encounter (Signed)
She was advised to f/u as needed at prior visit - ongoing management of lyrica can be managed by PCP. Thank you.

## 2022-06-03 NOTE — Telephone Encounter (Signed)
Last Prolia inj 05/24/22 Next Prolia inj due 11/24/22 

## 2022-06-05 ENCOUNTER — Other Ambulatory Visit: Payer: Self-pay | Admitting: Neurology

## 2022-06-20 ENCOUNTER — Telehealth: Payer: Self-pay | Admitting: Adult Health

## 2022-06-20 DIAGNOSIS — M79641 Pain in right hand: Secondary | ICD-10-CM

## 2022-06-20 DIAGNOSIS — I890 Lymphedema, not elsewhere classified: Secondary | ICD-10-CM

## 2022-06-20 DIAGNOSIS — G629 Polyneuropathy, unspecified: Secondary | ICD-10-CM

## 2022-06-20 DIAGNOSIS — M79604 Pain in right leg: Secondary | ICD-10-CM

## 2022-06-20 DIAGNOSIS — M79671 Pain in right foot: Secondary | ICD-10-CM

## 2022-06-20 NOTE — Addendum Note (Signed)
Addended by: Jacqualine Code D on: 06/20/2022 01:23 PM   Modules accepted: Orders

## 2022-06-20 NOTE — Telephone Encounter (Signed)
Pt called stating that her PCP informed her that they do not want to manage her pregabalin (LYRICA) 50 MG capsule and would like to have her neurologist manage it. Pt would like to know if her pregabalin (LYRICA) 50 MG capsule can be called in to the [facility] for a 90 day qt. Please advise.

## 2022-06-20 NOTE — Telephone Encounter (Signed)
Contacted pt, informed her Shanda Bumps agreed to place order to pain management. Reiterated  to contact PCP in the meantime and hopefully get some assistance with her pain until then. Pt understood and was appreciative.

## 2022-06-20 NOTE — Telephone Encounter (Signed)
Contacted pt, informed her Per last note from [place] pt can continue to follow with PCP for management or PCP can refer to pain clinic if indicated. Pt states she feels like PCP feels like that is more work for her, she has a heavy load already and she is in pain and just need help, I informed pt she can either contact PCP back and rq and referral to pain management or I can discuss with Shanda Bumps to see if she is welling to place referral. Are you willing to send pt to pain management? Please advise

## 2022-06-20 NOTE — Telephone Encounter (Signed)
Okay to place referral to pain management if PCP refusing but PCP will need to manage lyrica until evaluation with pain clinic. No indication for routine follow up with our office just to manage Lyrica as this is a chronic pain issue and we do not manage chronic pain. Will send to Dr. Lucia Gaskins to ensure to agrees with this but as of right now, please place referral to pain clinic and plan to continue to see PCP for ongoing management unless she hears otherwise. Thank you.

## 2022-06-27 ENCOUNTER — Telehealth: Payer: Self-pay | Admitting: Internal Medicine

## 2022-06-27 NOTE — Telephone Encounter (Signed)
Patient came into the office to request that her Rybelsus be switched to the [facility] with 90 day supply and refills. It will cost her less.    Call back number is [phone]

## 2022-06-28 ENCOUNTER — Encounter: Payer: Self-pay | Admitting: Physical Medicine and Rehabilitation

## 2022-06-28 ENCOUNTER — Other Ambulatory Visit: Payer: Self-pay

## 2022-06-28 DIAGNOSIS — E1142 Type 2 diabetes mellitus with diabetic polyneuropathy: Secondary | ICD-10-CM

## 2022-06-28 MED ORDER — RYBELSUS 7 MG PO TABS: 7.0000 mg | ORAL_TABLET | Freq: Every day | ORAL | 3 refills | Status: DC

## 2022-06-28 NOTE — Telephone Encounter (Signed)
RX now sent to preferred pharmacy.  

## 2022-08-29 ENCOUNTER — Ambulatory Visit: Payer: Medicare HMO | Admitting: Physical Medicine and Rehabilitation

## 2022-09-12 ENCOUNTER — Ambulatory Visit (INDEPENDENT_AMBULATORY_CARE_PROVIDER_SITE_OTHER): Payer: Medicare HMO | Admitting: Internal Medicine

## 2022-09-12 ENCOUNTER — Encounter: Payer: Self-pay | Admitting: Internal Medicine

## 2022-09-12 VITALS — BP 138/84 | HR 90 | Ht 62.0 in | Wt 203.0 lb

## 2022-09-12 DIAGNOSIS — M810 Age-related osteoporosis without current pathological fracture: Secondary | ICD-10-CM

## 2022-09-12 DIAGNOSIS — R450 Nervousness: Secondary | ICD-10-CM

## 2022-09-12 DIAGNOSIS — E1142 Type 2 diabetes mellitus with diabetic polyneuropathy: Secondary | ICD-10-CM

## 2022-09-12 DIAGNOSIS — E1165 Type 2 diabetes mellitus with hyperglycemia: Secondary | ICD-10-CM

## 2022-09-12 LAB — POCT GLYCOSYLATED HEMOGLOBIN (HGB A1C): Hemoglobin A1C: 5.9 % — AB (ref 4.0–5.6)

## 2022-09-12 LAB — VITAMIN D 25 HYDROXY (VIT D DEFICIENCY, FRACTURES): VITD: 56.5 ng/mL (ref 30.00–100.00)

## 2022-09-12 LAB — TSH: TSH: 1.35 u[IU]/mL (ref 0.35–5.50)

## 2022-09-12 MED ORDER — ONETOUCH VERIO VI STRP: 100.0000 | ORAL_STRIP | Freq: Every day | 3 refills | Status: AC

## 2022-09-12 NOTE — Patient Instructions (Addendum)
Please hold Rybelsus for 2 weeks. If the symptoms do not resolve, restart at 3.5 mg daily and increase as tolerated.   If the sugars worsen, restart Januvia.   See if your insurance covers a continuous glucose monitor - if so, which one, and to which supplier to send the Rx.  Please hold off restarting vitamin D until we see the new level.  Try to schedule a new bone density scan after 10/18/2022.  You should have an endocrinology follow-up appointment in 4 months.

## 2022-09-12 NOTE — Progress Notes (Signed)
Patient ID: Holly Fields, female   DOB: 08/07/1941, 81 y.o.   MRN: [id number]   HPI  Holly Fields is a 81 y.o.-year-old female, initially referred by her PCP, Dr. Drema Dallas, returning for follow-up for osteoporosis.  She also would want [place] to address her DM2.  Patient was initially seen in 2018, after which she was lost for follow-up for 3.5 years.  She came back in 2021, but since then, she was again lost for follow-up, but reestablish care 4 months ago.  Interim history: No falls or fractures since last visit. No dizziness, vertigo, disequilibrium. She does have neuropathy. Since last OV >> she was started on ABx for UTI >> then developed N/D >> had colonoscopy and EGD >> no pathology reportedly >> started Protonix >> still no appetite. She continues Rybelsus, started after last OV.  She has itching.  Osteoporosis:  She was diagnosed with osteoporosis in 2015.  Previously osteopenia.  Reviewed patient's DXA scan reports: 10/18/2020 Lumbar spine L1-L4 Femoral neck (FN)  T-score  -0.5 (<< -1.1) RFN: -1.8 LFN: -1.8   09/06/2018 ([facility]) Lumbar spine L1-L4 Femoral neck (FN)  T-score  -1.20 RFN: -2.20  LFN: -2.00  Change in BMD from previous DXA test (%) 16% increase  2% increase in RFN 5% increase in LFN   Date L1-L4 T score FN T score  09/04/2016 [facility]) -2.5 (-7.9%*) RFN: -2.3 LFN: -2.2  08/18/2014 ([place]) -1.8 RFN: -2.5 LFN: -2.1   Previous osteoporosis treatments:  - Fosamax >> started 10/2016 >> swelling on tongue, lips + dysphagia + skin rash (painful) + generalized pain - Prolia: 05/15/2017 11/21/2017 07/03/2018 02/27/2019 09/10/2019 04/07/2020 11/02/2020  05/13/2021 11/22/2021 05/24/2022  No vitamin D deficiency: 10/11/2020: 54.2 08/19/2019:  45 08/29/2016: 48.2 04/25/2016: 53.1 08/26/2015: 45.9 Lab Results  Component Value Date   VD25OH 46.50 04/12/2017   She is on vitamin D 2000 units daily  She does not take high vitamin A  doses.  She had multiple steroid injections in the knees stopped in 2016, when she had the last TKR.  She also had surgery for spinal stenosis and continues to have sciatica pain and numbness.  She also has lymphedema.  Her legs hurt at night.  She has been seen in the pain clinic before.  She had disequilibrium, now improved.  Menopause was at 81 years old.  She does not have a family history of osteoporosis.    No history of kidney stones.  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  She sees  sees Dr. Pearson Grippe with nephrology.    Latest BUN/creatinine: 03/27/2022: 31/1.93, GFR 26, glucose 149 10/11/2020: BUN/Cr 24/1.68, GFR 36, ACR <7.4, Glu 103 08/19/2019: CMP normal with the exception of: Glucose 100, BUN/creatinine 22/1.59, GFR 38, ACR <11.9 Lab Results  Component Value Date   BUN 21 06/14/2018  CREATININE 1.59 (H) 06/14/2018  08/2016: 25/1.52; eGFR 41  DM2: - dx. In 6712 - Complicated by peripheral neuropathy (lymphedema may contribute) - saw neurology (Dr. Jaynee Eagles) - started Lyrica 50 (could not tolerate 100 mg)  She is on: - Januvia 50 mg daily >> Rybelsus She was on Farxiga 5 mg daily in a.m. - started in 03/2022, but >> advised her to stop 2/2 potential for UTIs /yeast infections.  HbA1c levels reviewed: 03/27/2022: HbA1c  7.2% 07/15/2021: HbA1c 7.0% 10/11/2020: HbA1c 6.8% No results found for: "HGBA1C"  She checks CBGs 1-2x a day per review of her CBG log:  - am: 130s-144 >> 94-121 - 2h after b'fast: n/c - lunch: n/c >> 103 - 2h after lunch: 100 - dinner: 100-127 - 2h after dinner: 95-126 - bedtime: 104-134  Meter: One Touch Verio  +HL: 09/14/2021: 195/340/46/92 Lab Results  Component Value Date   CHOL 149 09/20/2018   HDL 51 09/20/2018   LDLCALC 68 09/20/2018   TRIG 146 09/20/2018  She is on Lipitor 10 mg daily.  Latest eye exam: 2023: No DR reportedly.Had cataract sx.  She has peripheral neuropathy and is on Lyrica 50 mg daily.  She sees neurology.  She also has a history of GERD with history of reflux esophagitis.  Also, fatigue, constipation. No history of pancreatitis.  ROS: + See HPI  I reviewed pt's medications, allergies, PMH, social hx, family hx:  Past Medical History:  Diagnosis Date   Angio-edema    Arthritis    CKD (chronic kidney disease), stage III ([place])     saw dr Florene Glen 2 years ago, now released from nephrology   Complication of anesthesia 2010   "sluggish after anesthesia and had vertigo", slow to awaken after knee artroscopy 2012   Diabetes ([place])    Eczema    at times   GERD (gastroesophageal reflux disease)    HTN (hypertension)    Hypercholesteremia    Lymphedema of leg    right more than RIGHT   Narrowing of airway 2010   Neuropathy    Numbness and tingling of right leg    Osteoarthritis    Osteopenia    Osteoporosis    PONV (postoperative nausea and vomiting)    pt has n/v and vertigo after anesthesia   PSVT (paroxysmal supraventricular tachycardia) (HCC)    Recurrent upper respiratory infection (URI)    Sinus tachycardia    Vertigo    at times, cannot turn on left side quickly or sleep on left side   Vitamin D deficiency    Past Surgical History:  Procedure Laterality Date   arthroscopic knee surgery Left 2012   CARDIAC CATHETERIZATION   2012   JOINT REPLACEMENT Right 2010   LUMBAR LAMINECTOMY/DECOMPRESSION MICRODISCECTOMY N/A 10/22/2013   Procedure: MICROLUMBAR DECOMPRESSION LUMBAR TWO TO THREE, LUMBAR THREE TO FOUR and faramenotomy two,threeand four ,five bilateral;  Surgeon: Johnn Hai, MD;  Location: [facility];  Service: Orthopedics;  Laterality: N/A;   NASAL SINUS SURGERY  yrs ago    x 2   TOTAL KNEE ARTHROPLASTY Left 03/01/2015   Procedure: LEFT TOTAL KNEE ARTHROPLASTY;  Surgeon: Gaynelle Arabian, MD;  Location: [facility];  Service: Orthopedics;  Laterality: Left;   Social History   Socioeconomic History   Marital status: Married    Spouse name: Not on file   Number of children: 1   Years of education: Not on file   Highest education level: Not on file  Occupational History    Comment:  retired  Tobacco Use   Smoking status: Former    Packs/day: 0.25    Years: 15.00    Total pack years: 3.75    Types: Cigarettes    Quit date: 12/04/1976    Years since quitting: 45.8   Smokeless tobacco: Never  Vaping Use   Vaping Use: Never used  Substance and Sexual Activity   Alcohol use: Yes    Comment: very occasional wine   Drug use: No   Sexual activity: Not on file  Other Topics Concern   Not on file  Social History Narrative   Caffeine- 1 drink 3 times per week (usually drinks decaf)   Left handed    Social Determinants of Health   Financial Resource Strain: Not on file  Food Insecurity: Not on file  Transportation Needs: Not on file  Physical Activity: Not on file  Stress: Not on file  Social Connections: Not on file  Intimate Partner Violence: Not on file   Current Outpatient Medications on File Prior to Visit  Medication Sig Dispense Refill   aspirin 81 MG tablet Take 81 mg by mouth daily.     atorvastatin (LIPITOR) 10 MG tablet Take 10 mg by mouth every morning.      Azelastine HCl 0.15 % SOLN Place 2 sprays into both nostrils 2 (two) times daily. 30 mL 5   betamethasone dipropionate 0.05 % cream  Apply topically 2 (two) times daily. 30 g 0   cetirizine (ZYRTEC) 10 MG tablet      Cholecalciferol (VITAMIN D3) 1000 units CAPS Take 1 capsule by mouth 3 (three) times a week.      clindamycin (CLEOCIN) 150 MG capsule Take 150 mg by mouth as needed.     famotidine (PEPCID) 20 MG tablet 1 tablet at bedtime as needed     fexofenadine (ALLEGRA) 180 MG tablet Take 90 mg by mouth daily as needed for allergies. Takes 1/2 tablet     fluticasone (FLONASE) 50 MCG/ACT nasal spray Place 2 sprays into the nose as needed for allergies.     glucose blood (ONETOUCH VERIO) test strip See admin instructions.     guaiFENesin (MUCINEX) 600 MG 12 hr tablet      hydrochlorothiazide (HYDRODIURIL) 25 MG tablet Take 1 tablet (25 mg total) by mouth daily. 90 tablet 3   lansoprazole (PREVACID) 15 MG capsule Take 15 mg by mouth daily at 12 noon.     levocetirizine (XYZAL) 5 MG tablet Take 1 tablet (5 mg total) by mouth every evening. 30 tablet 5   metoprolol succinate (TOPROL-XL) 25 MG 24 hr tablet Take 1 tablet (25 mg total) by mouth daily. Take with or immediately following a meal. 90 tablet 3   Misc Natural Products (NEURIVA PO) Take 1 capsule by mouth daily.     mometasone (NASONEX) 50 MCG/ACT nasal spray Place 2 sprays into the nose daily. Two sprays each in each nostril 17 g 5   NON FORMULARY Nervolink     NONFORMULARY OR COMPOUNDED ITEM Place onto the skin. Topiramate 2.5%, Celecoxib 2%, Gabapentin 6%, and Lidocaine 3%.  Apply 1-2 grams to the affected area 3-4 times daily.     OneTouch Delica Lancets 45Y MISC See admin instructions.     pregabalin (LYRICA) 50 MG capsule Take 50 mg by mouth daily at bedtime. When side effects have resolved increase to 50 mg twice daily. 60 capsule 2   Probiotic Product (ALIGN PO) Take by mouth.     Semaglutide (RYBELSUS) 7  MG TABS Take 7 mg by mouth daily. 30 tablet 3   Simethicone 125 MG CAPS      triamcinolone cream (KENALOG) 0.1 %   3   No current facility-administered  medications on file prior to visit.   Allergies  Allergen Reactions   Adenosine      bp plummets   Augmentin [Amoxicillin-Pot Clavulanate] Hives   Codeine Nausea And Vomiting   Doxycycline Hives   Fosamax [Alendronate] Swelling    Lips swell   Gabapentin Other (See Comments)    "psychotic" reactions   Hydrocodone-Acetaminophen     "out of it"   Hydromorphone Hcl Nausea And Vomiting   Lactose Intolerance (Gi)    Metformin And Related     diarrhea   Other Other (See Comments)   Oxycodone Nausea And Vomiting   Sulfa Antibiotics     Acute kidney injury   Sulfamethoxazole-Trimethoprim Other (See Comments)   Tramadol Nausea And Vomiting   Family History  Problem Relation Age of Onset   Arrhythmia Mother    Hyperlipidemia Mother    Hypertension Mother    Allergic rhinitis Mother    Arrhythmia Father    Heart attack Father    Heart failure Father    PE: BP 138/84 (BP Location: Left Arm, Patient Position: Sitting, Cuff Size: Normal)   Pulse 90   Ht '5\' 2"'  (1.575 m)   Wt 203 lb (92.1 kg)   SpO2 99%   BMI 37.13 kg/m  Wt Readings from Last 3 Encounters:  09/12/22 203 lb (92.1 kg)  05/04/22 223 lb (101.2 kg)  04/11/22 218 lb (98.9 kg)   Constitutional: overweight, in NAD Eyes: EOMI, no exophthalmos ENT: no thyromegaly, no cervical lymphadenopathy Cardiovascular: RRR, No MRG, ++ bilateral significant lymphedema Respiratory: CTA B Musculoskeletal: no deformities Skin: moist, warm, no rashes Neurological: no tremor with outstretched hands  Assessment: 1.  Osteoporosis  2. DM2, non-insulin-dependent  3. Jittery feeling  Plan: 1. Osteoporosis -Likely postmenopausal +/- due to steroid injections -I was able to review the latest 4 bone density reports: Her scores were lower than -2.5 in 2017 but improved afterwards.  There was a significant improvement in her T-scores between 2019 and 2021.  The improvement was evident both at the lumbar spine and the femoral necks.   This is possibly related to Prolia injections. -She is due for another bone density scan-we will order today at [facility] -She tolerates Prolia well, without thigh/hip/jaw pain.  She has sciatica pain, but this is chronic.  No new pain symptoms.  She was previously seen in the pain clinic. -She is at high risk for falls due to neuropathy, sciatica, and significant leg swelling.   -Plan to continue Prolia for now.  We  discussed that we can continue for more than 10 years if needed -She had no falls or fractures since last visit.  She is aware of fall precautions. -She has hypercalcemia most likely secondary to her kidney disease.  Her GFR was low, at 26, in 03/2022.  We discussed that Prolia is not contraindicated even for such a low GFR since it is not renally excreted. -At today's visit, we will recheck her vitamin D level.  She has an annual physical exam coming up with PCP.  2. DM2 -At last visit she was on low-dose DPP 4 inhibitor.  She tried [place] but developed increased urination.  She also has a history of UTIs and bladder incontinence and she was advised by nephrology that an SGLT2 inhibitor  meant may not be the best for her. HbA1c was slightly higher, at 7.2%.  At that time, I suggested to change from Januvia to Rybelsus.  We discussed about benefits and possible side effects.  I advised her to start at a low dose and increase as tolerated and as needed. -She was able to start Rybelsus, and sugars improved.  At today's visit, they are all at goal. -However, she has decreased appetite, lost 20 pounds since last visit, and she has nausea and diarrhea.  It is unclear whether these are related to Rybelsus, since she mentions that these started after she was started on antibiotics for a previous UTI.  She did have GI investigation which was essentially negative. -At today's visit, I advised her to try to stop Ozempic and just follow the blood sugars and her symptoms.  She will be off it for 2 weeks  and will get in touch with me afterwards to see how to proceed further.  If her symptoms improve, we will continue off the GLP-1 receptor agonist.  If they do not change, we can start it back at the low-dose and increase as tolerated and as needed.  If we cannot use Rybelsus going forward, we can restart Januvia. -She is interested in a CGM.  I advised her to check with her insurance to see if this was covered - I advised her to: Patient Instructions  Please hold Rybelsus for 2 weeks. If the symptoms do not resolve, restart at 3.5 mg daily and increase as tolerated.   If the sugars worsen, restart Januvia.   See if your insurance covers a continuous glucose monitor - if so, which one, and to which supplier to send the Rx.  Please hold off restarting vitamin D until we see the new level.  Try to schedule a new bone density scan after 10/18/2022.  You should have an endocrinology follow-up appointment in 4 months.  - HbA1c today is 5.9% (lower) - advised to check sugars at different times of the day - 1x a day, rotating check times - advised for yearly eye exams >> she is UTD - return to clinic in 4 months  3. Jittery feeling -She also complains of feeling jittery but does not have tremors or palpitations.   -Since her pulse is high today and also, she did lose 20 pounds since last visit, I will go ahead and check a TSH for her.  Component     Latest Ref Rng 09/12/2022  TSH     0.35 - 5.50 uIU/mL 1.35   Hemoglobin A1C     4.0 - 5.6 % 5.9 !   VITD     30.00 - 100.00 ng/mL 56.50     Philemon Kingdom, MD PhD [facility]

## 2022-09-14 ENCOUNTER — Ambulatory Visit: Payer: Medicare HMO | Admitting: Physician Assistant

## 2022-09-14 NOTE — Progress Notes (Deleted)
Cardiology Office Note:    Date:  09/14/2022   ID:  Holly Fields, DOB 1941-05-02, MRN [id number]  PCP:  Leeroy Cha, [place] Prov[facility] Cardiologist:  Sinclair Grooms, MD { Click to update primary MD,subspecialty MD or APP then REFRESH:1}    Referring MD: Leeroy Cha,*   No chief complaint on file. ***  History of Present Illness:    Holly Fields is a 81 y.o. female with a hx of ***  PSVT (tx with IV adenacard) HTN HLD T2DM with peripheral neuropahty Hx of lymphedema CKD stage 3  Previous cardiovascular history includes remote cath in 2010 done due to abnormal nuclear stress test, cath revealed normal coronary arteries.  Presented in 2016 for preop eval by Dr. Tamala Julian, was found to have heart rate of 108, sinus tach of the time.  TSH was normal.  In 2018 she was seen in the ED for unusual chest discomfort, shortness of breath, and abrupt onset of pounding in her head.  Found to have SVT with narrow complex, heart rate around 200 and hypotensive.  Was cardioverted using 6 mg of adenosine.  Labs revealed serum creatinine 1.98, was 1.68 the previous year.  TSH and potassium normal.  D-dimer abnormal and VQ scan negative.  WBC 15.8.  Dr. Radford Pax recommended initiating metoprolol.  Was given Cipro for possible UTI.  At follow-up in the office, she reported chronic edema and subsequent lower extremity pain.  Duplex was negative, BNP normal, diagnosis lymphedema.  2D echo in 2018 revealed EF 65 to 70%, grade 1 DD, normal CVP, aortic sclerosis without stenosis.  She has been followed at [facility] physical therapy and [facility] for lymphedema therapy.   Last seen by Dr. Tamala Julian on August 12, 2021.  Was doing well from a cardiac perspective.  Did not have any recurrence of SVT, this is being managed by metoprolol succinate 25 mg daily.  Was told to follow-up in 1 year.  Today she presents for follow-up.  She states.  Past Medical History:  Diagnosis Date    Angio-edema    Arthritis    CKD (chronic kidney disease), stage III ([place])     saw dr Florene Glen 2 years ago, now released from nephrology   Complication of anesthesia 2010   "sluggish after anesthesia and had vertigo", slow to awaken after knee artroscopy 2012   Diabetes ([place])    Eczema    at times   GERD (gastroesophageal reflux disease)    HTN (hypertension)    Hypercholesteremia    Lymphedema of leg    right more than RIGHT   Narrowing of airway 2010   Neuropathy    Numbness and tingling of right leg    Osteoarthritis    Osteopenia    Osteoporosis    PONV (postoperative nausea and vomiting)    pt has n/v and vertigo after anesthesia   PSVT (paroxysmal supraventricular tachycardia)    Recurrent upper respiratory infection (URI)    Sinus tachycardia    Vertigo    at times, cannot turn on left side quickly or sleep on left side   Vitamin D deficiency     Past Surgical History:  Procedure Laterality Date   arthroscopic knee surgery Left 2012   CARDIAC CATHETERIZATION  2012   JOINT REPLACEMENT Right 2010   LUMBAR LAMINECTOMY/DECOMPRESSION MICRODISCECTOMY N/A 10/22/2013   Procedure: MICROLUMBAR DECOMPRESSION LUMBAR TWO TO THREE, LUMBAR THREE TO FOUR and faramenotomy two,threeand four ,five bilateral;  Surgeon: Doroteo Bradford  Tonita Cong, MD;  Location: [facility];  Service: Orthopedics;  Laterality: N/A;   NASAL SINUS SURGERY  yrs ago    x 2   TOTAL KNEE ARTHROPLASTY Left 03/01/2015   Procedure: LEFT TOTAL KNEE ARTHROPLASTY;  Surgeon: Gaynelle Arabian, MD;  Location: [facility];  Service: Orthopedics;  Laterality: Left;    Current Medications: No outpatient medications have been marked as taking for the 09/14/22 encounter (Appointment) with Leanor Kail, [place].     Allergies:   Adenosine, Augmentin [amoxicillin-pot clavulanate], Codeine, Doxycycline, Fosamax [alendronate], Gabapentin, Hydrocodone-acetaminophen, Hydromorphone hcl, Lactose intolerance (gi), Metformin and related, Other, Oxycodone,  Sulfa antibiotics, Sulfamethoxazole-trimethoprim, and Tramadol   Social History   Socioeconomic History   Marital status: Married    Spouse name: Not on file   Number of children: 1   Years of education: Not on file   Highest education level: Not on file  Occupational History    Comment: retired  Tobacco Use   Smoking status: Former    Packs/day: 0.25    Years: 15.00    Total pack years: 3.75    Types: Cigarettes    Quit date: 12/04/1976    Years since quitting: 45.8   Smokeless tobacco: Never  Vaping Use   Vaping Use: Never used  Substance and Sexual Activity   Alcohol use: Yes    Comment: very occasional wine   Drug use: No   Sexual activity: Not on file  Other Topics Concern   Not on file  Social History Narrative   Caffeine- 1 drink 3 times per week (usually drinks decaf)   Left handed    Social Determinants of Health   Financial Resource Strain: Not on file  Food Insecurity: Not on file  Transportation Needs: Not on file  Physical Activity: Not on file  Stress: Not on file  Social Connections: Not on file     Family History: The patient's ***family history includes Allergic rhinitis in her mother; Arrhythmia in her father and mother; Heart attack in her father; Heart failure in her father; Hyperlipidemia in her mother; Hypertension in her mother.  ROS:   Please see the history of present illness.    *** All other systems reviewed and are negative.  EKGs/Labs/Other Studies Reviewed:    The following studies were reviewed today: ***  EKG:  EKG is *** ordered today.  The ekg ordered today demonstrates ***  Recent Labs: 09/12/2022: TSH 1.35  Recent Lipid Panel    Component Value Date/Time   CHOL 149 09/20/2018 0000   TRIG 146 09/20/2018 0000   HDL 51 09/20/2018 0000   LDLCALC 68 09/20/2018 0000     Risk Assessment/Calculations:   {Does this patient have ATRIAL FIBRILLATION?:[id number]}  No BP recorded.  {Refresh Note OR Click here to enter BP   :1}***         Physical Exam:    VS:  There were no vitals taken for this visit.    Wt Readings from Last 3 Encounters:  09/12/22 203 lb (92.1 kg)  05/04/22 223 lb (101.2 kg)  04/11/22 218 lb (98.9 kg)     GEN: *** Well nourished, well developed in no acute distress HEENT: Normal NECK: No JVD; No carotid bruits LYMPHATICS: No lymphadenopathy CARDIAC: ***RRR, no murmurs, rubs, gallops RESPIRATORY:  Clear to auscultation without rales, wheezing or rhonchi  ABDOMEN: Soft, non-tender, non-distended MUSCULOSKELETAL:  No edema; No deformity  SKIN: Warm and dry NEUROLOGIC:  Alert and oriented x 3 PSYCHIATRIC:  Normal affect  ASSESSMENT:    No diagnosis found. PLAN:    In order of problems listed above:  ***      {Are you ordering a CV Procedure (e.g. stress test, cath, DCCV, TEE, etc)?   Press F2        :[id number]    Medication Adjustments/Labs and Tests Ordered: Current medicines are reviewed at length with the patient today.  Concerns regarding medicines are outlined above.  No orders of the defined types were placed in this encounter.  No orders of the defined types were placed in this encounter.   There are no Patient Instructions on file for this visit.   Signed, Finis Bud, NP  09/14/2022 5:30 AM    [place]

## 2022-09-27 ENCOUNTER — Ambulatory Visit: Payer: Medicare HMO | Attending: Physician Assistant | Admitting: Physician Assistant

## 2022-09-27 ENCOUNTER — Encounter: Payer: Self-pay | Admitting: Physician Assistant

## 2022-09-27 VITALS — BP 134/70 | HR 85 | Ht 62.0 in | Wt 200.4 lb

## 2022-09-27 DIAGNOSIS — N1832 Chronic kidney disease, stage 3b: Secondary | ICD-10-CM | POA: Diagnosis not present

## 2022-09-27 DIAGNOSIS — E785 Hyperlipidemia, unspecified: Secondary | ICD-10-CM

## 2022-09-27 DIAGNOSIS — I10 Essential (primary) hypertension: Secondary | ICD-10-CM

## 2022-09-27 DIAGNOSIS — I890 Lymphedema, not elsewhere classified: Secondary | ICD-10-CM | POA: Diagnosis not present

## 2022-09-27 DIAGNOSIS — I471 Supraventricular tachycardia, unspecified: Secondary | ICD-10-CM

## 2022-09-27 NOTE — Progress Notes (Signed)
Cardiology Office Note:    Date:  09/27/2022   ID:  Kyasha, Wissel [birth year], MRN [id number]  PCP:  Leeroy Cha, MD  [facility] Cardiologist:  Sinclair Grooms, MD  [place] Electrophysiologist:  None   Chief Complaint: Yearly follow-up  History of Present Illness:    Holly Fields is a 81 y.o. female with a hx of hypertension, hyperlipidemia, diabetes mellitus, chronic kidney disease stage III, prior PSVT and lymphedema seen for follow-up.  Echocardiogram in 2018 with LV function of 65 to 70% and grade 1 diastolic dysfunction.  Here today for follow-up.  She denies chest pain, shortness of breath, orthopnea, PND, syncope, melena, palpitation or dizziness.  Compliant with medication.  She use cane for for ambulation.  She has neuropathy and stable lymphedema.  Past Medical History:  Diagnosis Date   Angio-edema    Arthritis    CKD (chronic kidney disease), stage III ([place])     saw dr Florene Glen 2 years ago, now released from nephrology   Complication of anesthesia 2010   "sluggish after anesthesia and had vertigo", slow to awaken after knee artroscopy 2012   Diabetes ([place])    Eczema    at times   GERD (gastroesophageal reflux disease)    HTN (hypertension)    Hypercholesteremia    Lymphedema of leg    right more than RIGHT   Narrowing of airway 2010   Neuropathy    Numbness and tingling of right leg    Osteoarthritis    Osteopenia    Osteoporosis    PONV (postoperative nausea and vomiting)    pt has n/v and vertigo after anesthesia   PSVT (paroxysmal supraventricular tachycardia)    Recurrent upper respiratory infection (URI)    Sinus tachycardia    Vertigo    at times, cannot turn on left side quickly or sleep on left side   Vitamin D deficiency     Past Surgical History:  Procedure Laterality Date   arthroscopic knee surgery Left 2012   CARDIAC CATHETERIZATION  2012   JOINT REPLACEMENT Right 2010   LUMBAR LAMINECTOMY/DECOMPRESSION  MICRODISCECTOMY N/A 10/22/2013   Procedure: MICROLUMBAR DECOMPRESSION LUMBAR TWO TO THREE, LUMBAR THREE TO FOUR and faramenotomy two,threeand four ,five bilateral;  Surgeon: Johnn Hai, MD;  Location: [facility];  Service: Orthopedics;  Laterality: N/A;   NASAL SINUS SURGERY  yrs ago    x 2   TOTAL KNEE ARTHROPLASTY Left 03/01/2015   Procedure: LEFT TOTAL KNEE ARTHROPLASTY;  Surgeon: Gaynelle Arabian, MD;  Location: [facility];  Service: Orthopedics;  Laterality: Left;    Current Medications: Current Meds  Medication Sig   aspirin 81 MG tablet Take 81 mg by mouth daily.   atorvastatin (LIPITOR) 10 MG tablet Take 10 mg by mouth every morning.    Azelastine HCl 0.15 % SOLN Place 2 sprays into both nostrils 2 (two) times daily.   betamethasone dipropionate 0.05 % cream Apply topically 2 (two) times daily.   cetirizine (ZYRTEC) 10 MG tablet    Cholecalciferol (VITAMIN D3) 1000 units CAPS Take 1 capsule by mouth 3 (three) times a week.    clindamycin (CLEOCIN) 150 MG capsule Take 150 mg by mouth as needed.   famotidine (PEPCID) 20 MG tablet 1 tablet at bedtime as needed   fexofenadine (ALLEGRA) 180 MG tablet Take 90 mg by mouth daily as needed for allergies. Takes 1/2 tablet   fluticasone (FLONASE) 50 MCG/ACT nasal spray Place 2 sprays into the nose  as needed for allergies.   glucose blood (ONETOUCH VERIO) test strip 100 each by Other route daily.   guaiFENesin (MUCINEX) 600 MG 12 hr tablet    hydrochlorothiazide (HYDRODIURIL) 25 MG tablet Take 1 tablet (25 mg total) by mouth daily.   lansoprazole (PREVACID) 15 MG capsule Take 15 mg by mouth daily at 12 noon.   levocetirizine (XYZAL) 5 MG tablet Take 1 tablet (5 mg total) by mouth every evening.   metoprolol succinate (TOPROL-XL) 25 MG 24 hr tablet Take 1 tablet (25 mg total) by mouth daily. Take with or immediately following a meal.   Misc Natural Products (NEURIVA PO) Take 1 capsule by mouth daily.   mometasone (NASONEX) 50 MCG/ACT nasal spray  Place 2 sprays into the nose daily. Two sprays each in each nostril   NON FORMULARY Nervolink   NONFORMULARY OR COMPOUNDED ITEM Place onto the skin. Topiramate 2.5%, Celecoxib 2%, Gabapentin 6%, and Lidocaine 3%.  Apply 1-2 grams to the affected area 3-4 times daily.   OneTouch Delica Lancets [id number] MISC See admin instructions.   pantoprazole (PROTONIX) 20 MG tablet Take 20 mg by mouth 2 (two) times daily.   pregabalin (LYRICA) 50 MG capsule Take 50 mg by mouth daily at bedtime. When side effects have resolved increase to 50 mg twice daily.   Probiotic Product (ALIGN PO) Take by mouth.   Semaglutide (RYBELSUS) 7 MG TABS Take 7 mg by mouth daily.   Simethicone 125 MG CAPS    triamcinolone cream (KENALOG) 0.1 %      Allergies:   Adenosine, Augmentin [amoxicillin-pot clavulanate], Codeine, Doxycycline, Fosamax [alendronate], Gabapentin, Hydrocodone-acetaminophen, Hydromorphone hcl, Lactose intolerance (gi), Metformin and related, Other, Oxycodone, Sulfa antibiotics, Sulfamethoxazole-trimethoprim, and Tramadol   Social History   Socioeconomic History   Marital status: Married    Spouse name: Not on file   Number of children: 1   Years of education: Not on file   Highest education level: Not on file  Occupational History    Comment: retired  Tobacco Use   Smoking status: Former    Packs/day: 0.25    Years: 15.00    Total pack years: 3.75    Types: Cigarettes    Quit date: 12/04/1976    Years since quitting: 45.8   Smokeless tobacco: Never  Vaping Use   Vaping Use: Never used  Substance and Sexual Activity   Alcohol use: Yes    Comment: very occasional wine   Drug use: No   Sexual activity: Not on file  Other Topics Concern   Not on file  Social History Narrative   Caffeine- 1 drink 3 times per week (usually drinks decaf)   Left handed    Social Determinants of Health   Financial Resource Strain: Not on file  Food Insecurity: Not on file  Transportation Needs: Not on file   Physical Activity: Not on file  Stress: Not on file  Social Connections: Not on file     Family History: The patient's family history includes Allergic rhinitis in her mother; Arrhythmia in her father and mother; Heart attack in her father; Heart failure in her father; Hyperlipidemia in her mother; Hypertension in her mother.    ROS:   Please see the history of present illness.    All other systems reviewed and are negative.   EKGs/Labs/Other Studies Reviewed:    The following studies were reviewed today:  2D Doppler echocardiogram 2018:   Study Conclusions   - Left ventricle: The cavity size was  normal. Wall thickness was    normal. Systolic function was vigorous. The estimated ejection    fraction was in the range of 65% to 70%. Wall motion was normal;    there were no regional wall motion abnormalities. Doppler    parameters are consistent with abnormal left ventricular    relaxation (grade 1 diastolic dysfunction). The E/e&' ratio is    between 8-15, suggesting indeterminate LV filling pressure.  - Aortic valve: Trileaflet. Sclerosis without stenosis. There was    no regurgitation.  - Left atrium: The atrium was normal in size.  - Tricuspid valve: There was trivial regurgitation.  - Pulmonary arteries: PA peak pressure: 19 mm Hg (S).  - Inferior vena cava: The vessel was normal in size. The    respirophasic diameter changes were in the normal range (>= 50%),    consistent with normal central venous pressure.   Impressions:   - LVEF 65-70%, normal wall thickness, normal wall motion, grade 1    DD, indeterminate LV filling, normal LA size, trivial TR, RVSP 19    mmHg, normal IVC.   EKG:  EKG is  ordered today.  The ekg ordered today demonstrates normal sinus rhythm, chronic right bundle branch block  Recent Labs: 09/12/2022: TSH 1.35  Recent Lipid Panel    Component Value Date/Time   CHOL 149 09/20/2018 0000   TRIG 146 09/20/2018 0000   HDL 51 09/20/2018 0000    LDLCALC 68 09/20/2018 0000     Physical Exam:    VS:  BP 134/70 (BP Location: Left Arm, Patient Position: Sitting, Cuff Size: Normal)   Pulse 85   Ht 5\' 2"  (1.575 m)   Wt 200 lb 6.4 oz (90.9 kg)   SpO2 97%   BMI 36.65 kg/m     Wt Readings from Last 3 Encounters:  09/27/22 200 lb 6.4 oz (90.9 kg)  09/12/22 203 lb (92.1 kg)  05/04/22 223 lb (101.2 kg)     GEN:  Well nourished, well developed in no acute distress HEENT: Normal NECK: No JVD; No carotid bruits LYMPHATICS: No lymphadenopathy CARDIAC: RRR, no murmurs, rubs, gallops RESPIRATORY:  Clear to auscultation without rales, wheezing or rhonchi  ABDOMEN: Soft, non-tender, non-distended MUSCULOSKELETAL: Bilateral edema; No deformity  SKIN: Warm and dry NEUROLOGIC:  Alert and oriented x 3 PSYCHIATRIC:  Normal affect   ASSESSMENT AND PLAN:    PSVT No reoccurrence on beta-blocker.  Continue metoprolol succinate.  2.  Hypertension Stable and controlled on current medication.  3.  Hyperlipidemia LDL 73.  Continue Lipitor 10 mg daily.  4.  Chronic lower extremity edema due to lymphedema -Has seen by vascular  5. DM - A1c 5.9   Medication Adjustments/Labs and Tests Ordered: Current medicines are reviewed at length with the patient today.  Concerns regarding medicines are outlined above.  Orders Placed This Encounter  Procedures   EKG 12-Lead   No orders of the defined types were placed in this encounter.   Patient Instructions  Medication Instructions:  Your physician recommends that you continue on your current medications as directed. Please refer to the Current Medication list given to you today. *If you need a refill on your cardiac medications before your next appointment, please call your pharmacy*   Lab Work: None Ordered If you have labs (blood work) drawn today and your tests are completely normal, you will receive your results only by: [place] (if you have MyChart) OR A paper copy in the  mail If you have  any lab test that is abnormal or we need to change your treatment, we will call you to review the results.   Testing/Procedures: None Ordered   Follow-Up: At [facility], you and your health needs are our priority.  As part of our continuing mission to provide you with exceptional heart care, we have created designated Provider Care Teams.  These Care Teams include your primary Cardiologist (physician) and Advanced Practice Providers (APPs -  Physician Assistants and Nurse Practitioners) who all work together to provide you with the care you need, when you need it.  We recommend signing up for the patient portal called "MyChart".  Sign up information is provided on this After Visit Summary.  MyChart is used to connect with patients for Virtual Visits (Telemedicine).  Patients are able to view lab/test results, encounter notes, upcoming appointments, etc.  Non-urgent messages can be sent to your provider as well.   To learn more about what you can do with MyChart, go to [email or web address].    Your next appointment:   1 year(s)  The format for your next appointment:   In Person  Provider:   Sinclair Grooms, MD     Other Instructions   Important Information About Sugar         Jarrett Soho, [place]  09/27/2022 1:53 PM    [place]

## 2022-09-27 NOTE — Patient Instructions (Signed)
Medication Instructions:  Your physician recommends that you continue on your current medications as directed. Please refer to the Current Medication list given to you today. *If you need a refill on your cardiac medications before your next appointment, please call your pharmacy*   Lab Work: None Ordered If you have labs (blood work) drawn today and your tests are completely normal, you will receive your results only by: [place] (if you have MyChart) OR A paper copy in the mail If you have any lab test that is abnormal or we need to change your treatment, we will call you to review the results.   Testing/Procedures: None Ordered   Follow-Up: At [facility], you and your health needs are our priority.  As part of our continuing mission to provide you with exceptional heart care, we have created designated Provider Care Teams.  These Care Teams include your primary Cardiologist (physician) and Advanced Practice Providers (APPs -  Physician Assistants and Nurse Practitioners) who all work together to provide you with the care you need, when you need it.  We recommend signing up for the patient portal called "MyChart".  Sign up information is provided on this After Visit Summary.  MyChart is used to connect with patients for Virtual Visits (Telemedicine).  Patients are able to view lab/test results, encounter notes, upcoming appointments, etc.  Non-urgent messages can be sent to your provider as well.   To learn more about what you can do with MyChart, go to [email or web address].    Your next appointment:   1 year(s)  The format for your next appointment:   In Person  Provider:   Sinclair Grooms, MD     Other Instructions   Important Information About Sugar

## 2022-09-28 ENCOUNTER — Other Ambulatory Visit: Payer: Self-pay | Admitting: Internal Medicine

## 2022-09-28 DIAGNOSIS — E1142 Type 2 diabetes mellitus with diabetic polyneuropathy: Secondary | ICD-10-CM

## 2022-10-03 ENCOUNTER — Encounter: Payer: Medicare HMO | Attending: Physical Medicine and Rehabilitation | Admitting: Physical Medicine and Rehabilitation

## 2022-10-03 VITALS — BP 119/76 | HR 78 | Ht 62.0 in | Wt 200.4 lb

## 2022-10-03 DIAGNOSIS — E1142 Type 2 diabetes mellitus with diabetic polyneuropathy: Secondary | ICD-10-CM | POA: Insufficient documentation

## 2022-10-03 DIAGNOSIS — E1165 Type 2 diabetes mellitus with hyperglycemia: Secondary | ICD-10-CM | POA: Diagnosis not present

## 2022-10-03 DIAGNOSIS — I890 Lymphedema, not elsewhere classified: Secondary | ICD-10-CM | POA: Insufficient documentation

## 2022-10-03 NOTE — Patient Instructions (Addendum)
[place]  Foods that may reduce pain: 1) Ginger (especially studied for arthritis)- reduce leukotriene production to decrease inflammation 2) Blueberries- high in phytonutrients that decrease inflammation 3) Salmon- marine omega-3s reduce joint swelling and pain 4) Pumpkin seeds- reduce inflammation 5) dark chocolate- reduces inflammation 6) turmeric- reduces inflammation 7) tart cherries - reduce pain and stiffness 8) extra virgin olive oil - its compound olecanthal helps to block prostaglandins  9) chili peppers- can be eaten or applied topically via capsaicin 10) mint- helpful for headache, muscle aches, joint pain, and itching 11) garlic- reduces inflammation  Link to further information on diet for chronic pain: [email or web address]    Manual decongestive therapy

## 2022-10-03 NOTE — Progress Notes (Signed)
Subjective:    Patient ID: Holly Fields, female    DOB: 04/26/1941, 81 y.o.   MRN: [id number]  HPI [name] is a 81 year old woman who presents to establish care for diabetic peripheral polyneuropathy  1) Peripheral neuropathy -she was on gabapentin -pain is worst at night -numbness and tingling  -it is also severe when she does lots of walking or standing -weather makes it worse  2) Lymphedema -diagnosed when she had her first knee replacement -she was told she can get it from an injury  Pain Inventory Average Pain 9 Pain Right Now 9 My pain is burning, tingling, and aching  In the last 24 hours, has pain interfered with the following? General activity 5 Relation with others 5 Enjoyment of life 5 What TIME of day is your pain at its worst? night Sleep (in general) Fair  Pain is worse with: walking and standing Pain improves with: rest, heat/ice, and medication Relief from Meds: 7  use a cane how many minutes can you walk? 10 ability to climb steps?  yes do you drive?  yes  retired  weakness numbness tingling trouble walking spasms  New pt  New pt    Family History  Problem Relation Age of Onset   Arrhythmia Mother    Hyperlipidemia Mother    Hypertension Mother    Allergic rhinitis Mother    Arrhythmia Father    Heart attack Father    Heart failure Father    Social History   Socioeconomic History   Marital status: Married    Spouse name: Not on file   Number of children: 1   Years of education: Not on file   Highest education level: Not on file  Occupational History    Comment: retired  Tobacco Use   Smoking status: Former    Packs/day: 0.25    Years: 15.00    Total pack years: 3.75    Types: Cigarettes    Quit date: 12/04/1976    Years since quitting: 45.8   Smokeless tobacco: Never  Vaping Use   Vaping Use: Never used  Substance and Sexual Activity   Alcohol use: Yes    Comment: very occasional wine   Drug use: No   Sexual  activity: Not on file  Other Topics Concern   Not on file  Social History Narrative   Caffeine- 1 drink 3 times per week (usually drinks decaf)   Left handed    Social Determinants of Health   Financial Resource Strain: Not on file  Food Insecurity: Not on file  Transportation Needs: Not on file  Physical Activity: Not on file  Stress: Not on file  Social Connections: Not on file   Past Surgical History:  Procedure Laterality Date   arthroscopic knee surgery Left 2012   CARDIAC CATHETERIZATION  2012   JOINT REPLACEMENT Right 2010   LUMBAR LAMINECTOMY/DECOMPRESSION MICRODISCECTOMY N/A 10/22/2013   Procedure: MICROLUMBAR DECOMPRESSION LUMBAR TWO TO THREE, LUMBAR THREE TO FOUR and faramenotomy two,threeand four ,five bilateral;  Surgeon: Javier Docker, MD;  Location: [facility];  Service: Orthopedics;  Laterality: N/A;   NASAL SINUS SURGERY  yrs ago    x 2   TOTAL KNEE ARTHROPLASTY Left 03/01/2015   Procedure: LEFT TOTAL KNEE ARTHROPLASTY;  Surgeon: Ollen Gross, MD;  Location: [facility];  Service: Orthopedics;  Laterality: Left;   Past Medical History:  Diagnosis Date   Angio-edema    Arthritis    CKD (chronic kidney disease),  stage III [facility])     saw dr Lowell Guitar 2 years ago, now released from nephrology   Complication of anesthesia 2010   "sluggish after anesthesia and had vertigo", slow to awaken after knee artroscopy 2012   Diabetes (HCC)    Eczema    at times   GERD (gastroesophageal reflux disease)    HTN (hypertension)    Hypercholesteremia    Lymphedema of leg    right more than RIGHT   Narrowing of airway 2010   Neuropathy    Numbness and tingling of right leg    Osteoarthritis    Osteopenia    Osteoporosis    PONV (postoperative nausea and vomiting)    pt has n/v and vertigo after anesthesia   PSVT (paroxysmal supraventricular tachycardia)    Recurrent upper respiratory infection (URI)    Sinus tachycardia    Vertigo    at times, cannot turn on left side quickly  or sleep on left side   Vitamin D deficiency    BP 119/76   Pulse 78   Ht 5\' 2"  (1.575 m)   Wt 200 lb 6.4 oz (90.9 kg)   SpO2 97%   BMI 36.65 kg/m   Opioid Risk Score:   Fall Risk Score:  `1  Depression screen [facility] 2/9     10/03/2022   12:44 PM 01/27/2021    3:28 PM  Depression screen PHQ 2/9  Decreased Interest 1 0  Down, Depressed, Hopeless 0 0  PHQ - 2 Score 1 0  Altered sleeping 2   Tired, decreased energy 1   Change in appetite 1   Feeling bad or failure about yourself  0   Trouble concentrating 0   Moving slowly or fidgety/restless 1   Suicidal thoughts 0   PHQ-9 Score 6   Difficult doing work/chores Somewhat difficult      Review of Systems  Musculoskeletal:  Positive for gait problem.       Bilateral lower leg pain spasms  Neurological:  Positive for weakness and numbness.  All other systems reviewed and are negative.     Objective:   Physical Exam Gen: no distress, normal appearing HEENT: oral mucosa pink and moist, NCAT Cardio: Reg rate Chest: normal effort, normal rate of breathing Abd: soft, non-distended Ext: severe lymphedema Psych: pleasant, normal affect Skin: intact Neuro: Alert, decreased sensation to feet       Assessment & Plan:   1) Peripheral neuropathy -Discussed current symptoms of pain and history of pain.  -Discussed most recent HgbA1c of 5.9 -Discussed Qutenza as an option for neuropathic pain control. Discussed that this is a capsaicin patch, stronger than capsaicin cream. Discussed that it is currently approved for diabetic peripheral neuropathy and post-herpetic neuralgia, but that it has also shown benefit in treating other forms of neuropathy. Provided patient with link to site to learn more about the patch: 4/9. Discussed that the patch would be placed in office and benefits usually last 3 months. Discussed that unintended exposure to capsaicin can cause severe irritation of eyes, mucous membranes,  respiratory tract, and skin, but that Qutenza is a local treatment and does not have the systemic side effects of other nerve medications. Discussed that there may be pain, itching, erythema, and decreased sensory function associated with the application of Qutenza. Side effects usually subside within 1 week. A cold pack of analgesic medications can help with these side effects. Blood pressure can also be increased due to pain associated with administration of the  patch.  -discussed that Lyrica helps her rest ant night but she feels dizzy.  -Discussed benefits of exercise in reducing pain. -Provided with a pain relief journal and discussed that it contains foods and lifestyle tips to naturally help to improve pain. Discussed that these lifestyle strategies are also very good for health unlike some medications which can have negative side effects. Discussed that the act of keeping a journal can be therapeutic and helpful to realize patterns what helps to trigger and alleviate pain.   -Discussed following foods that may reduce pain: 1) Ginger (especially studied for arthritis)- reduce leukotriene production to decrease inflammation 2) Blueberries- high in phytonutrients that decrease inflammation 3) Salmon- marine omega-3s reduce joint swelling and pain 4) Pumpkin seeds- reduce inflammation 5) dark chocolate- reduces inflammation 6) turmeric- reduces inflammation 7) tart cherries - reduce pain and stiffness 8) extra virgin olive oil - its compound olecanthal helps to block prostaglandins  9) chili peppers- can be eaten or applied topically via capsaicin 10) mint- helpful for headache, muscle aches, joint pain, and itching 11) garlic- reduces inflammation  Link to further information on diet for chronic pain: [email or web address]   2) Lymphedema -continue wrapping -continue ginger oil -discussed that this could potentially make  her Qutenza less effective

## 2022-10-10 NOTE — Telephone Encounter (Signed)
Prolia VOB initiated via [email or web address]  Last Prolia inj 05/24/22 Next Prolia inj due 11/24/22

## 2022-11-04 NOTE — Telephone Encounter (Signed)
Pt ready for scheduling on or after 11/24/22   Out-of-pocket cost due at time of visit: $0   Primary: Aetna Medicare ESA PPO Prolia co-insurance: 0% Admin fee co-insurance: 0%   Secondary: n/a Prolia co-insurance:  Admin fee co-insurance:    Deductible: does not apply   Prior Auth: APPROVED PA# [id number] Valid: 05/01/22-05/01/23     ** This summary of benefits is an estimation of the patient's out-of-pocket cost. Exact cost may very based on individual plan coverage.

## 2022-11-08 NOTE — Telephone Encounter (Signed)
Patient is scheduled for Prolia injection on 11/29/2022.

## 2022-11-29 ENCOUNTER — Ambulatory Visit (INDEPENDENT_AMBULATORY_CARE_PROVIDER_SITE_OTHER): Payer: Medicare HMO

## 2022-11-29 VITALS — BP 132/76 | HR 70 | Ht 62.0 in | Wt 200.0 lb

## 2022-11-29 DIAGNOSIS — M810 Age-related osteoporosis without current pathological fracture: Secondary | ICD-10-CM

## 2022-11-29 MED ORDER — DENOSUMAB 60 MG/ML ~~LOC~~ SOSY
60.0000 mg | PREFILLED_SYRINGE | Freq: Once | SUBCUTANEOUS | Status: AC
  Administered 2022-11-29: 60 mg via SUBCUTANEOUS

## 2022-11-29 NOTE — Progress Notes (Signed)
Patient verbally confirmed name, date of birth, and correct medication to be administered. Prolia injection administered and pt tolerated well.  

## 2022-12-21 NOTE — Telephone Encounter (Signed)
Last Prolia inj 11/29/22 Next Prolia inj due 06/01/23

## 2023-01-02 ENCOUNTER — Ambulatory Visit: Payer: Medicare HMO | Admitting: Physical Medicine and Rehabilitation

## 2023-01-16 ENCOUNTER — Encounter: Payer: Self-pay | Admitting: Internal Medicine

## 2023-01-16 ENCOUNTER — Ambulatory Visit (INDEPENDENT_AMBULATORY_CARE_PROVIDER_SITE_OTHER): Payer: Medicare HMO | Admitting: Internal Medicine

## 2023-01-16 VITALS — BP 128/72 | HR 76 | Ht 62.0 in | Wt 205.6 lb

## 2023-01-16 DIAGNOSIS — E1142 Type 2 diabetes mellitus with diabetic polyneuropathy: Secondary | ICD-10-CM | POA: Diagnosis not present

## 2023-01-16 DIAGNOSIS — M810 Age-related osteoporosis without current pathological fracture: Secondary | ICD-10-CM | POA: Diagnosis not present

## 2023-01-16 DIAGNOSIS — E1165 Type 2 diabetes mellitus with hyperglycemia: Secondary | ICD-10-CM | POA: Diagnosis not present

## 2023-01-16 LAB — POCT GLYCOSYLATED HEMOGLOBIN (HGB A1C): Hemoglobin A1C: 6.2 % — AB (ref 4.0–5.6)

## 2023-01-16 MED ORDER — SITAGLIPTIN PHOSPHATE 50 MG PO TABS: 50.0000 mg | ORAL_TABLET | Freq: Every day | ORAL | 3 refills | Status: DC

## 2023-01-16 NOTE — Progress Notes (Addendum)
Patient ID: Holly Fields, female   DOB: [birth year], 82 y.o.   MRN: [id number]   HPI  Holly Fields is a 82 y.o.-year-old female, initially referred by her PCP, Dr. Drema Dallas, returning for follow-up for osteoporosis.  She also would want [place] to address her DM2.  Patient was initially seen in 2018, after which she was lost for follow-up for 3.5 years.  She came back in 2021, but since then, she was again lost for follow-up, but reestablish care 4 months ago.  Interim history: No falls or fractures since last visit. No dizziness, vertigo, disequilibrium. She has sciatica, neuropathy - back on ALA and now on Lyrica, Since last OV >> she was started on ABx for UTI >> then developed N/D >> had colonoscopy and EGD >> no pathology reportedly >> started Protonix >> still no appetite. She stopped Rybelsus and now on Januvia. She still has itching. She reduced her HCTZ dose 2/2 increased urination.  Osteoporosis:  She was diagnosed with osteoporosis in 2015.  Previously osteopenia.  Reviewed patient's DXA scan reports: 10/18/2020 Lumbar spine L1-L4 Femoral neck (FN)  T-score  -0.5 (<< -1.1) RFN: -1.8 LFN: -1.8   09/06/2018 ([facility]) Lumbar spine L1-L4 Femoral neck (FN)  T-score  -1.20 RFN: -2.20  LFN: -2.00  Change in BMD from previous DXA test (%) 16% increase  2% increase in RFN 5% increase in LFN   Date L1-L4 T score FN T score  09/04/2016 [facility]) -2.5 (-7.9%*) RFN: -2.3 LFN: -2.2  08/18/2014 ([facility]) -1.8 RFN: -2.5 LFN: -2.1   Previous osteoporosis treatments:  - Fosamax >> started 10/2016 >> swelling on tongue, lips + dysphagia + skin rash (painful) + generalized pain - Prolia: 05/15/2017 11/21/2017 07/03/2018 02/27/2019 09/10/2019 04/07/2020 11/02/2020  05/13/2021 11/22/2021 05/24/2022 11/29/2022  No vitamin D deficiency: Lab Results  Component Value Date   VD25OH 56.50 09/12/2022   VD25OH 46.50 04/12/2017  10/11/2020: 54.2 08/19/2019:  45 08/29/2016:  48.2 04/25/2016: 53.1 08/26/2015: 45.9  She is on vitamin D 2000 units daily  She does not take high vitamin A doses.  She had multiple steroid injections in the knees stopped in 2016, when she had the last TKR.  She also had surgery for spinal stenosis and continues to have sciatica pain and numbness.  She also has lymphedema.  Her legs hurt at night.  She has been seen in the pain clinic before.  She had disequilibrium, now improved.  Menopause was at 82 years old.  She does not have a family history of osteoporosis.    No history of kidney stones.  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  She sees  sees Dr. Pearson Grippe with nephrology.    Latest BUN/creatinine: 03/27/2022: 31/1.93, GFR 26, glucose 149 10/11/2020: BUN/Cr 24/1.68, GFR 36, ACR <7.4, Glu 103  08/19/2019: CMP normal with the exception of: Glucose 100, BUN/creatinine 22/1.59, GFR 38, ACR <11.9 Lab Results  Component Value Date   BUN 21 06/14/2018   CREATININE 1.59 (H) 06/14/2018  08/2016: 25/1.52; eGFR 41  DM2: - dx. In 99991111 - Complicated by peripheral neuropathy (lymphedema may contribute) - saw neurology (Dr. Jaynee Eagles) - started Lyrica 50 (could not tolerate 100 mg)  She is on: - Januvia 50 mg daily >> Rybelsus >> Januvia 50 mg daily She was  on Farxiga 5 mg daily in a.m. - started in 03/2022, but >> advised her to stop 2/2 potential for UTIs /yeast infections.  HbA1c levels reviewed: Lab Results  Component Value Date   HGBA1C 5.9 (A) 09/12/2022  03/27/2022: HbA1c 7.2% 07/15/2021: HbA1c 7.0% 10/11/2020: HbA1c 6.8%  She checks CBGs 1-2x a day per review of her CBG log:  - am: 130s-144 >> 94-121 >> 95-120, 134 - 2h after b'fast: n/c - lunch: n/c >> 103 >> 99-121, 133 - 2h after lunch: 100 >> 121-129 - dinner: 100-127 >> 84-136 - 2h after dinner: 95-126 >> n/c - bedtime: 104-134 >> n/c  Meter: One Touch Verio  +HL: 09/14/2021: 195/340/46/92 Lab Results  Component Value Date   CHOL 149 09/20/2018   HDL 51 09/20/2018   LDLCALC 68 09/20/2018   TRIG 146 09/20/2018  She is on Lipitor 10 mg daily.  Latest eye exam: 2023: No DR reportedly.Had cataract sx.  She has peripheral neuropathy and is on Lyrica 25 mg daily.  She sees neurology.  She also has a history of GERD with history of reflux esophagitis.  Also, fatigue, constipation. No history of pancreatitis.  ROS: + See HPI  I reviewed pt's medications, allergies, PMH, social hx, family hx:  Past Medical History:  Diagnosis Date   Angio-edema    Arthritis    CKD (chronic kidney disease), stage III ([place])     saw dr Florene Glen 2 years ago, now released from nephrology   Complication of anesthesia 2010   "sluggish after anesthesia and had vertigo", slow to awaken after knee artroscopy 2012   Diabetes ([place])    Eczema    at times   GERD (gastroesophageal reflux disease)    HTN (hypertension)    Hypercholesteremia    Lymphedema of leg    right more than RIGHT   Narrowing of airway 2010   Neuropathy    Numbness and tingling of right leg    Osteoarthritis    Osteopenia    Osteoporosis    PONV (postoperative nausea and vomiting)    pt has n/v and vertigo after anesthesia   PSVT (paroxysmal supraventricular tachycardia)    Recurrent upper respiratory infection  (URI)    Sinus tachycardia    Vertigo    at times, cannot turn on left side quickly or sleep on left side   Vitamin D deficiency    Past Surgical History:  Procedure Laterality Date   arthroscopic knee surgery Left 2012   CARDIAC CATHETERIZATION  2012   JOINT REPLACEMENT Right 2010   LUMBAR LAMINECTOMY/DECOMPRESSION MICRODISCECTOMY N/A 10/22/2013   Procedure: MICROLUMBAR DECOMPRESSION LUMBAR TWO TO THREE, LUMBAR THREE TO FOUR and faramenotomy two,threeand four ,five bilateral;  Surgeon: Johnn Hai, MD;  Location: [facility];  Service: Orthopedics;  Laterality: N/A;   NASAL SINUS SURGERY  yrs ago    x 2   TOTAL KNEE ARTHROPLASTY Left 03/01/2015   Procedure: LEFT TOTAL KNEE ARTHROPLASTY;  Surgeon: Gaynelle Arabian, MD;  Location: [facility];  Service:  Orthopedics;  Laterality: Left;   Social History   Socioeconomic History   Marital status: Married    Spouse name: Not on file   Number of children: 1   Years of education: Not on file   Highest education level: Not on file  Occupational History    Comment: retired  Tobacco Use   Smoking status: Former    Packs/day: 0.25    Years: 15.00    Total pack years: 3.75    Types: Cigarettes    Quit date: 12/04/1976    Years since quitting: 46.1   Smokeless tobacco: Never  Vaping Use   Vaping Use: Never used  Substance and Sexual Activity   Alcohol use: Yes    Comment: very occasional wine   Drug use: No   Sexual activity: Not on file  Other Topics Concern   Not on file  Social History Narrative   Caffeine- 1 drink 3 times per week (usually drinks decaf)   Left handed    Social Determinants of Health   Financial Resource Strain: Not on file  Food Insecurity: Not on file  Transportation Needs: Not on file  Physical Activity: Not on file  Stress: Not on file  Social Connections: Not on file  Intimate Partner Violence: Not on file   Current Outpatient Medications on File Prior to Visit  Medication Sig Dispense Refill    atorvastatin (LIPITOR) 10 MG tablet Take 10 mg by mouth every morning.      fluticasone (FLONASE) 50 MCG/ACT nasal spray Place 2 sprays into the nose as needed for allergies.     glucose blood (ONETOUCH VERIO) test strip 100 each by Other route daily. 100 each 3   hydrochlorothiazide (HYDRODIURIL) 25 MG tablet Take 1 tablet (25 mg total) by mouth daily. 90 tablet 3   metoprolol succinate (TOPROL-XL) 25 MG 24 hr tablet Take 1 tablet (25 mg total) by mouth daily. Take with or immediately following a meal. 90 tablet 3   NON FORMULARY Nervolink (Patient not taking: Reported on 10/03/2022)     OneTouch Delica Lancets 99991111 MISC See admin instructions.     pantoprazole (PROTONIX) 20 MG tablet Take 20 mg by mouth 2 (two) times daily.     Probiotic Product (ALIGN PO) Take by mouth.     No current facility-administered medications on file prior to visit.   Allergies  Allergen Reactions   Adenosine      bp plummets   Augmentin [Amoxicillin-Pot Clavulanate] Hives   Codeine Nausea And Vomiting   Doxycycline Hives   Fosamax [Alendronate] Swelling    Lips swell   Gabapentin Other (See Comments)    "psychotic" reactions   Hydrocodone-Acetaminophen     "out of it"   Hydromorphone Hcl Nausea And Vomiting   Lactose Intolerance (Gi)    Metformin And Related     diarrhea   Other Other (See Comments)   Oxycodone Nausea And Vomiting   Sulfa Antibiotics     Acute kidney injury   Sulfamethoxazole-Trimethoprim Other (See Comments)   Tramadol Nausea And Vomiting   Family History  Problem Relation Age of Onset   Arrhythmia Mother    Hyperlipidemia Mother    Hypertension Mother    Allergic rhinitis Mother    Arrhythmia Father    Heart attack Father    Heart failure Father    PE: BP 128/72 (BP Location: Left Arm, Patient Position: Sitting, Cuff Size: Normal)   Pulse 76   Ht 5' 2"$  (1.575 m)  Wt 205 lb 9.6 oz (93.3 kg)   SpO2 99%   BMI 37.60 kg/m  Wt Readings from Last 3 Encounters:  01/16/23  205 lb 9.6 oz (93.3 kg)  11/29/22 200 lb (90.7 kg)  10/03/22 200 lb 6.4 oz (90.9 kg)   Constitutional: overweight, in NAD Eyes: EOMI, no exophthalmos ENT: no thyromegaly, no cervical lymphadenopathy Cardiovascular: RRR, No MRG, ++ bilateral significant lymphedema Respiratory: CTA B Musculoskeletal: no deformities Skin: moist, warm, no rashes Neurological: no tremor with outstretched hands  Assessment: 1.  Osteoporosis  2. DM2, non-insulin-dependent  3. Jittery feeling  Plan: 1. Osteoporosis -Likely postmenopausal +/- due to steroid injections -Reviewing the last 4 bone density reports, her lowest course were in 2017 (<-2.5) but improved afterwards.  There was a significant improvement in T-scores between 2019 and 2021.  The improvement was evident both at the lumbar spine and the femoral necks.  This is possibly related to Prolia injections.  At last visit, she was due for another bone density scan and I ordered this at [facility].  However, she did not have this done yet.  Will order it again today. -She continues on Prolia.  She tolerates this well without thigh/hip/jaw pain.  She has sciatica pain, but this is chronic.  She was previously seen in the pain clinic. -No falls or fractures since last visit she is at risk for falls due to neuropathy, and significant leg swelling.  Also, she is not exercising and we discussed about doing some weightbearing exercises.  She is aware about fall precautions. -Will continue Prolia for now.  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  We discussed that Prolia is not contraindicated even for such a low GFR since it is not renally excreted. -Reviewed latest vitamin D level from 09/12/2022: This was normal, at 56.5.  We will recheck her vitamin D level at next visit.  2. DM2 -She was previously on DPP 4 inhibitor, then tried an SGLT2 inhibitor but developed  increased urination.  She does have a history of UTIs and bladder incontinence and she was advised by nephrology that an SGLT2 inhibitor was probably not the best for her.  As the HbA1c increased to 7.2%, I suggested a change from Januvia to Rybelsus.  Sugars improved on Rybelsus, however, she described decreased appetite, nausea, diarrhea, and weight loss of 20 pounds before last visit.  It was unclear whether these were related to Rybelsus since she was on antibiotics for previous UTI.  She did have GI investigation which was essentially negative.  I did advise her to stop Rybelsus for 2 weeks to see if the symptoms improve and then if not, to restart at a lower dose.  We did discuss that if she still could not tolerate Rybelsus, to restart Januvia.  I also suggested a CGM.  She was not able to obtain this. She was also not able to get Rybelsus so she is back on Januvia, renally dosed.  As her blood sugars are at goal, we can continue this for now. - I advised her to: Patient Instructions  Please continue Januvia 50 mg daily.   Try to schedule a new bone density.  You should have an endocrinology follow-up appointment in 6 months.  - we checked her HbA1c, which was slightly higher, but still at goal: Lab Results  Component Value Date   HGBA1C 6.2 (A) 01/16/2023  - advised to  check sugars at different times of the day - 1x a day, rotating check times - advised for yearly eye exams >> she is UTD - return to clinic in 6 months   DXA (03/02/2023) - [facility]  Lumbar spine L1-L4 Femoral neck (FN) Total femur 33% right distal radius  T-score  N/a RFN: -1.5 LFN: -1.3 RTF: -1.1 LTF: -1.1 -2.8  The right distal radius score is low, however, I do not have a comparison.  The rest of the scores are improved.  Philemon Kingdom, MD PhD [facility] Endocrinology

## 2023-01-16 NOTE — Patient Instructions (Addendum)
Please continue Januvia 50 mg daily.   Try to schedule a new bone density.  You should have an endocrinology follow-up appointment in 6 months.

## 2023-03-05 ENCOUNTER — Encounter: Payer: Self-pay | Admitting: Internal Medicine

## 2023-04-19 NOTE — Telephone Encounter (Signed)
VOB initiated °

## 2023-04-24 NOTE — Telephone Encounter (Signed)
Prior Auth required for [employer]  PA PROCESS DETAILS: PA is required. Call [phone] or complete the PA form available at [email or web address] Fax completed form to [phone]. Or submit PA request online at [email or web address]

## 2023-04-25 NOTE — Telephone Encounter (Signed)
Prior Authorization initiated for [facility] via [employer] Case ID: [id number]

## 2023-05-03 NOTE — Telephone Encounter (Signed)
Prior Auth APPROVED PA# [id number]  Valid: 05/03/23-05/02/24

## 2023-05-03 NOTE — Telephone Encounter (Signed)
Pt ready for scheduling on or after 06/01/23  Out-of-pocket cost due at time of visit: $0  Primary: Aetna Medicare PPO Prolia co-insurance: 0% Admin fee co-insurance: 0%  Deductible: does not apply  Prior Auth APPROVED PA# [id number]  Valid: 05/03/23-05/02/24  Secondary: N/A Prolia co-insurance:  Admin fee co-insurance:  Deductible:  Prior Auth:  PA# Valid:     ** This summary of benefits is an estimation of the patient's out-of-pocket cost. Exact cost may very based on individual plan coverage.

## 2023-06-01 ENCOUNTER — Ambulatory Visit (INDEPENDENT_AMBULATORY_CARE_PROVIDER_SITE_OTHER): Payer: Medicare HMO

## 2023-06-01 VITALS — BP 150/70 | HR 76 | Ht <= 58 in | Wt 210.4 lb

## 2023-06-01 DIAGNOSIS — M810 Age-related osteoporosis without current pathological fracture: Secondary | ICD-10-CM | POA: Diagnosis not present

## 2023-06-01 MED ORDER — DENOSUMAB 60 MG/ML ~~LOC~~ SOSY
60.0000 mg | PREFILLED_SYRINGE | Freq: Once | SUBCUTANEOUS | Status: AC
  Administered 2023-06-01: 60 mg via SUBCUTANEOUS

## 2023-06-01 NOTE — Progress Notes (Signed)
After obtaining consent, and per orders of Dr. Elvera Lennox, injection of Prolia   given by Leota Sauers. Patient instructed to remain in clinic for 20 minutes afterwards, and to report any adverse reaction to me immediately.

## 2023-06-03 NOTE — Telephone Encounter (Signed)
Last Prolia inj 06/01/23 Next Prolia inj due 12/02/23

## 2023-07-19 ENCOUNTER — Encounter: Payer: Self-pay | Admitting: Internal Medicine

## 2023-07-19 ENCOUNTER — Ambulatory Visit (INDEPENDENT_AMBULATORY_CARE_PROVIDER_SITE_OTHER): Payer: Medicare HMO | Admitting: Internal Medicine

## 2023-07-19 VITALS — BP 130/70 | HR 75 | Ht 62.0 in | Wt 208.8 lb

## 2023-07-19 DIAGNOSIS — E119 Type 2 diabetes mellitus without complications: Secondary | ICD-10-CM

## 2023-07-19 DIAGNOSIS — E1165 Type 2 diabetes mellitus with hyperglycemia: Secondary | ICD-10-CM

## 2023-07-19 DIAGNOSIS — E1142 Type 2 diabetes mellitus with diabetic polyneuropathy: Secondary | ICD-10-CM | POA: Diagnosis not present

## 2023-07-19 DIAGNOSIS — M810 Age-related osteoporosis without current pathological fracture: Secondary | ICD-10-CM

## 2023-07-19 DIAGNOSIS — Z7984 Long term (current) use of oral hypoglycemic drugs: Secondary | ICD-10-CM

## 2023-07-19 LAB — HEMOGLOBIN A1C: Hemoglobin A1C: 6.1

## 2023-07-19 NOTE — Patient Instructions (Addendum)
Please continue Januvia 50 mg daily.   Please continue Prolia.  Restart vitamin D 2000 units daily.  Please return for another visit in 6 months.

## 2023-07-19 NOTE — Progress Notes (Signed)
Patient ID: TRAMIKA GRANGE, female   DOB: Dec 17, 1940, 82 y.o.   MRN: [id number]   HPI  ADELEINE GERADS is a 82 y.o.-year-old female, initially referred by her PCP, Dr. Zachery Dauer, returning for follow-up for osteoporosis.  She also would want [place] to address her DM2.  Last visit 6 months ago.  Interim history: No falls or fractures since last visit. No dizziness, vertigo, disequilibrium. She has sciatica, neuropathy - back on ALA and now on Lyrica - but not taking it consistently b/c dizziness, dysequilibrium. She had Covid 07/01/2023. She had HA, chills, cough. She is starting to feel better.  Osteoporosis:  She was diagnosed with osteoporosis in 2015.  Previously osteopenia.  Reviewed patient's DXA scan reports: DXA (03/02/2023) - [facility]  Lumbar spine L1-L4 Femoral neck (FN) Total femur 33% right distal radius  T-score  N/a RFN: -1.5 LFN: -1.3 RTF: -1.1 LTF: -1.1 -2.8   10/18/2020 Lumbar spine L1-L4 Femoral neck (FN)  T-score  -0.5 (<< -1.1) RFN: -1.8 LFN: -1.8   09/06/2018 ([facility]) Lumbar spine L1-L4 Femoral neck (FN)  T-score  -1.20 RFN: -2.20  LFN: -2.00  Change in BMD from previous DXA test (%) 16% increase  2% increase in RFN 5% increase in LFN   Date L1-L4 T score FN T score  09/04/2016 [facility]) -2.5 (-7.9%*) RFN: -2.3 LFN: -2.2  08/18/2014 ([facility]) -1.8 RFN: -2.5 LFN: -2.1   Previous osteoporosis treatments:  - Fosamax >> started 10/2016 >> swelling on tongue, lips + dysphagia + skin rash (painful) + generalized pain - Prolia: 05/15/2017 11/21/2017 07/03/2018 02/27/2019 09/10/2019 04/07/2020 11/02/2020  05/13/2021 11/22/2021 05/24/2022 11/29/2022 06/01/2023  No vitamin D deficiency: Lab Results  Component Value Date   VD25OH 56.50 09/12/2022   VD25OH 46.50 04/12/2017  10/11/2020: 54.2 08/19/2019:  45 08/29/2016: 48.2 04/25/2016: 53.1 08/26/2015: 45.9  She is on vitamin D 2000 units daily  She does not take high vitamin A doses.  She had  multiple steroid injections in the knees stopped in 2016, when she had the last TKR.  She also had surgery for spinal stenosis and continues to have sciatica pain and numbness.  She also has lymphedema.  Her legs hurt at night.  She has been seen in the pain clinic before.  She had disequilibrium, now improved. She walks with a cane.  Menopause was at 82 years old.  She does not have a family history of osteoporosis.    No history of kidney stones.  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  She sees  sees Dr. Sabra Heck with nephrology.    Latest BUN/creatinine:  03/27/2022: 31/1.93, GFR 26, glucose 149 10/11/2020: BUN/Cr 24/1.68, GFR 36, ACR <7.4, Glu 103 08/19/2019: CMP normal with the exception of: Glucose 100, BUN/creatinine 22/1.59, GFR 38, ACR <11.9 Lab  Results  Component Value Date   BUN 21 06/14/2018   CREATININE 1.59 (H) 06/14/2018  08/2016: 25/1.52; eGFR 41  DM2: - dx. In 2001 - Complicated by peripheral neuropathy (lymphedema may contribute) - saw neurology (Dr. Lucia Gaskins) - started Lyrica 50 (could not tolerate 100 mg)  She is on: - Januvia 50 mg daily >> Rybelsus >> Januvia 50 mg daily She was on Farxiga 5 mg daily in a.m. - started in 03/2022, but >> advised her to stop 2/2  potential for UTIs /yeast infections.  HbA1c levels reviewed: Lab Results  Component Value Date   HGBA1C 6.2 (A) 01/16/2023   HGBA1C 5.9 (A) 09/12/2022  03/27/2022: HbA1c 7.2% 07/15/2021: HbA1c 7.0% 10/11/2020: HbA1c 6.8%  She checks CBGs 1-2x a day per review of her CBG log:  - am: 130s-144 >> 94-121 >> 95-120, 134 >> 84-121 - 2h after b'fast: n/c >> 114 - lunch: n/c >> 103 >> 99-121, 133 >> 135 - 2h after lunch: 100 >> 121-129 >> n/c - dinner: 100-127 >> 84-136 >> 91-139 - 2h after dinner: 95-126 >> n/c - bedtime: 104-134 >> n/c  Meter: One Touch Verio  +HL: 09/14/2021: 195/340/46/92 Lab Results  Component Value Date   CHOL 149 09/20/2018   HDL 51 09/20/2018   LDLCALC 68 09/20/2018   TRIG 146 09/20/2018  She is on Lipitor 10 mg daily.  Latest eye exam: 2023: No DR reportedly.Had cataract sx.  She has peripheral neuropathy and is on Lyrica 25 mg daily.  She sees neurology.  She also has a history of GERD with history of reflux esophagitis.  Also, fatigue, constipation. No history of pancreatitis.  ROS: + See HPI  I reviewed pt's medications, allergies, PMH, social hx, family hx:  Past Medical History:  Diagnosis Date   Angio-edema    Arthritis    CKD (chronic kidney disease), stage III (HCC)     saw dr Lowell Guitar 2 years ago, now released from nephrology   Complication of anesthesia 2010   "sluggish after anesthesia and had vertigo", slow to awaken after knee artroscopy 2012   Diabetes (HCC)    Eczema    at times   GERD (gastroesophageal reflux disease)    HTN (hypertension)    Hypercholesteremia    Lymphedema of leg    right more than RIGHT   Narrowing of airway 2010   Neuropathy    Numbness and tingling of right leg    Osteoarthritis    Osteopenia    Osteoporosis    PONV (postoperative nausea and vomiting)    pt has n/v and vertigo after anesthesia   PSVT (paroxysmal supraventricular tachycardia)    Recurrent upper respiratory infection (URI)     Sinus tachycardia    Vertigo    at times, cannot turn on left side quickly or sleep on left side   Vitamin D deficiency    Past Surgical History:  Procedure Laterality Date   arthroscopic knee surgery Left 2012   CARDIAC CATHETERIZATION  2012   JOINT REPLACEMENT Right 2010   LUMBAR LAMINECTOMY/DECOMPRESSION MICRODISCECTOMY N/A 10/22/2013   Procedure: MICROLUMBAR DECOMPRESSION LUMBAR TWO TO THREE, LUMBAR THREE TO FOUR and faramenotomy two,threeand four ,five bilateral;  Surgeon: Javier Docker, MD;  Location: [facility];  Service: Orthopedics;  Laterality: N/A;   NASAL SINUS SURGERY  yrs ago    x 2   TOTAL KNEE ARTHROPLASTY Left 03/01/2015   Procedure: LEFT TOTAL KNEE ARTHROPLASTY;  Surgeon: Ollen Gross, MD;  Location: [facility];  Service:  Orthopedics;  Laterality: Left;   Social History   Socioeconomic History   Marital status: Married    Spouse name: Not on file   Number of children: 1   Years of education: Not on file   Highest education level: Not on file  Occupational History    Comment: retired  Tobacco Use   Smoking status: Former    Current packs/day: 0.00    Average packs/day: 0.3 packs/day for 15.0 years (3.8 ttl pk-yrs)    Types: Cigarettes    Start date: 12/04/1961    Quit date: 12/04/1976    Years since quitting: 46.6   Smokeless tobacco: Never  Vaping Use   Vaping status: Never Used  Substance and Sexual Activity   Alcohol use: Yes    Comment: very occasional wine   Drug use: No   Sexual activity: Not on file  Other Topics Concern   Not on file  Social History Narrative   Caffeine- 1 drink 3 times per week (usually drinks decaf)   Left handed    Social Determinants of Health   Financial Resource Strain: Not on file  Food Insecurity: Not on file  Transportation Needs: Not on file  Physical Activity: Not on file  Stress: Not on file  Social Connections: Not on file  Intimate Partner Violence: Not on file   Current Outpatient Medications on File Prior to  Visit  Medication Sig Dispense Refill   atorvastatin (LIPITOR) 10 MG tablet Take 10 mg by mouth every morning.      fluticasone (FLONASE) 50 MCG/ACT nasal spray Place 2 sprays into the nose as needed for allergies.     glucose blood (ONETOUCH VERIO) test strip 100 each by Other route daily. 100 each 3   hydrochlorothiazide (HYDRODIURIL) 25 MG tablet Take 1 tablet (25 mg total) by mouth daily. 90 tablet 3   metoprolol succinate (TOPROL-XL) 25 MG 24 hr tablet Take 1 tablet (25 mg total) by mouth daily. Take with or immediately following a meal. 90 tablet 3   NON FORMULARY Nervolink (Patient not taking: Reported on 10/03/2022)     OneTouch Delica Lancets 33G MISC See admin instructions.     pantoprazole (PROTONIX) 20 MG tablet Take 20 mg by mouth 2 (two) times daily.     Probiotic Product (ALIGN PO) Take by mouth.     sitaGLIPtin (JANUVIA) 50 MG tablet Take 1 tablet (50 mg total) by mouth daily. 90 tablet 3   No current facility-administered medications on file prior to visit.   Allergies  Allergen Reactions   Adenosine      bp plummets   Augmentin [Amoxicillin-Pot Clavulanate] Hives   Codeine Nausea And Vomiting   Doxycycline Hives   Fosamax [Alendronate] Swelling    Lips swell   Gabapentin Other (See Comments)    "psychotic" reactions   Hydrocodone-Acetaminophen     "out of it"   Hydromorphone Hcl Nausea And Vomiting   Lactose Intolerance (Gi)    Metformin And Related     diarrhea   Other Other (See Comments)   Oxycodone Nausea And Vomiting   Sulfa Antibiotics     Acute kidney injury   Sulfamethoxazole-Trimethoprim Other (See Comments)   Tramadol Nausea And Vomiting   Family History  Problem Relation Age of Onset   Arrhythmia Mother    Hyperlipidemia Mother    Hypertension Mother    Allergic rhinitis Mother    Arrhythmia Father    Heart attack Father    Heart failure Father  PE: BP 130/70   Pulse 75   Wt 208 lb 12.8 oz (94.7 kg)   SpO2 99%   BMI 1019.46 kg/m   Wt Readings from Last 3 Encounters:  07/19/23 208 lb 12.8 oz (94.7 kg)  06/01/23 210 lb 6.4 oz (95.4 kg)  01/16/23 205 lb 9.6 oz (93.3 kg)   Constitutional: overweight, in NAD Eyes: EOMI, no exophthalmos ENT: no thyromegaly, no cervical lymphadenopathy Cardiovascular: RRR, No MRG, ++ bilateral significant lymphedema Respiratory: CTA B Musculoskeletal: no deformities Skin: no rashes Neurological: no tremor with outstretched hands  Assessment: 1.  Osteoporosis  2. DM2, non-insulin-dependent  3. Jittery feeling  Plan: 1. Osteoporosis -Likely postmenopausal +/- due to steroid injections -Her bone density T-scores improved from 2017 (when T lowest T-cors was <-2.5) over the following checks up to 2021.  The improvement was evident both at the lumbar spine and the femoral necks.  This was probably due to Prolia injections.  Since last visit, we had another bone density scan checked on 03/02/2023.  We reviewed T-scores together.  They appeared to be improved at the hips, but she had a T-score of -2.8 at the 33% distal radius.  This was quite low, but I do not have a previous reading at this site to compare. -She continues on Prolia.  She tolerates this well, without thigh/hip/jaw pain.  She has sciatica pain, but this is chronic.  She was previously seen in the pain clinic. -No falls or fractures since last visit.  She is at risk for falls due to neuropathy and significant leg swelling.  She was not exercising at last visit and I recommended some weightbearing exercises and discussed about fall precautions.  She is not exercising now. -We will continue Prolia for now.  We did discuss that none of the available agents have very good penetration of the distal radius and exercises are recommended to improve the bone density at this site -She has hypercalcemia most likely secondary to her kidney disease.  Prolia is not contraindicated in this case. -Vitamin D level was normal in 09/2022. -Will  have her return in 6 months  2. DM2 -She was previously on a DPP 4 inhibitor, then tried an SGLT2 inhibitor but developed increased urination and she also has a history of UTI and bladder incontinence so nephrology recommended against an SGLT2 inhibitor.  We tried to change from Januvia to Rybelsus but she developed decreased appetite, nausea, diarrhea and weight loss of 20 pounds (however, this could have been related to being on antibiotics for UTI) so she is now back on Januvia 50 mg daily.  At last visit, HbA1c was 6.2%, at goal.  We did not change the regimen.  Blood sugars were at goal. -At today's visit, sugars remain mostly at goal with only mildly occasional hyperglycemic values sometimes before meals, which she noticed after her COVID-19 infection and mostly when she has a lot of pain.  For now, I did not recommend a change in regimen.  She is on the lower dose of Januvia. - I advised her to: Patient Instructions  Please continue Januvia 50 mg daily.   Please continue Prolia.  Restart vitamin D 2000 units daily.  Please return for another visit in 6 months.  - we checked her HbA1c: 6.1% (slightly lower) - advised to check sugars at different times of the day - 1x a day, rotating check times - advised for yearly eye exams >> she is UTD - return to clinic in 6 months  Carlus Pavlov, MD PhD [facility]

## 2023-09-25 ENCOUNTER — Ambulatory Visit: Payer: Medicare HMO | Attending: Cardiology | Admitting: Cardiology

## 2023-09-25 ENCOUNTER — Encounter: Payer: Self-pay | Admitting: Cardiology

## 2023-09-25 VITALS — BP 130/70 | HR 69 | Ht 62.0 in | Wt 212.8 lb

## 2023-09-25 DIAGNOSIS — I10 Essential (primary) hypertension: Secondary | ICD-10-CM

## 2023-09-25 DIAGNOSIS — I471 Supraventricular tachycardia, unspecified: Secondary | ICD-10-CM | POA: Diagnosis not present

## 2023-09-25 DIAGNOSIS — I890 Lymphedema, not elsewhere classified: Secondary | ICD-10-CM | POA: Diagnosis not present

## 2023-09-25 DIAGNOSIS — N1832 Chronic kidney disease, stage 3b: Secondary | ICD-10-CM

## 2023-09-25 NOTE — Progress Notes (Signed)
Cardiology Office Note:  .   Date:  09/25/2023  ID:  Holly Fields, DOB 1941-08-03, MRN [id number] PCP: Camie Patience, FNP  [facility] Providers Cardiologist:  Donato Schultz, MD     History of Present Illness: [name] Kitchen   Holly Fields is a 82 y.o. female Discussed with the use of AI scribe   History of Present Illness   The patient, a 82 year old with a history of paroxysmal supraventricular tachycardia (PSVT), lymphedema, diabetes, chronic kidney disease stage three, hypertension, and hyperlipidemia, presents for follow-up. The patient's PSVT has not recurred since starting Toprol, and her hypertension is well-controlled. She also has chronic lower extremity edema due to lymphedema, managed conservatively.  The patient experienced a single episode of PSVT after a stressful event, characterized by a rapid heart rate and dizziness. She was treated in the emergency department, and has not had a recurrence since.  The patient also reports struggles with neuropathy, lymphedema, and arthritis, which make mobility difficult and exhausting. She has not noticed any side effects from recently started Valsartan. She also reports a recent increase in her Atorvastatin due to elevated cholesterol levels.  The patient had a bout of COVID-19 in July, after which she noticed a decrease in vigor and an increase in blood pressure. She also reports a history of kidney disease, and is due for a follow-up to monitor her kidney function.   Mother is 106          ROS: No CP, no SOB  Studies Reviewed: [name] Kitchen   EKG Interpretation Date/Time:  Tuesday September 25 2023 09:49:54 EDT Ventricular Rate:  69 PR Interval:  150 QRS Duration:  112 QT Interval:  412 QTC Calculation: 441 R Axis:   -1  Text Interpretation: Normal sinus rhythm Right bundle branch block When compared with ECG of 03-Oct-2017 16:48, Right bundle branch block is new Confirmed by Donato Schultz ([id number]) on 09/25/2023 9:58:07 AM     Results LABS LDL: 112 (09/18/2023) Hemoglobin A1c: 6.6 Creatinine: 1.7 ALT: 17  DIAGNOSTIC EKG: Right bundle branch block (09/25/2023)  Risk Assessment/Calculations:            Physical Exam:   VS:  BP 130/70   Pulse 69   Ht 5\' 2"  (1.575 m)   Wt 212 lb 12.8 oz (96.5 kg)   SpO2 95%   BMI 38.92 kg/m    Wt Readings from Last 3 Encounters:  09/25/23 212 lb 12.8 oz (96.5 kg)  07/19/23 208 lb 12.8 oz (94.7 kg)  06/01/23 210 lb 6.4 oz (95.4 kg)    GEN: Well nourished, well developed in no acute distress, Cane NECK: No JVD; No carotid bruits CARDIAC: RRR, no murmurs, no rubs, no gallops RESPIRATORY:  Clear to auscultation without rales, wheezing or rhonchi  ABDOMEN: Soft, non-tender, non-distended EXTREMITIES:  Chronic LE edema; No deformity   ASSESSMENT AND PLAN: .    Assessment and Plan    Paroxysmal Supraventricular Tachycardia (PSVT) No recurrence since starting Toprol XL. Discussed potential for electrophysiology evaluation for ablation if symptoms return. -Continue Toprol XL 25mg  daily.  Hypertension Well controlled on current regimen. Blood pressure today 130/70. -Continue Diovan 80mg  daily, Hydrochlorothiazide 25mg  daily, and Toprol XL 25mg  daily.  Hyperlipidemia Last LDL 112 on 09/18/2023. Recently increased Atorvastatin due to elevated cholesterol. -Continue Atorvastatin 20mg  daily.  Diabetes Mellitus with Chronic Kidney Disease (CKD) Stage 3 Last Hemoglobin A1c 6.6. Creatinine 1.7. Recently started on Valsartan. -Continue Valsartan 80mg  daily. -Check renal function in November with PCP.  Lymphedema Chronic lower extremity edema. Managed conservatively. -Continue with conservative measures.  Follow-up in 1 year. If symptoms of PSVT return, contact the office/EMS if needed immediately.               Signed, Donato Schultz, MD

## 2023-09-25 NOTE — Patient Instructions (Signed)

## 2023-10-24 NOTE — Telephone Encounter (Signed)
Prolia VOB initiated via [email or web address]  Next Prolia inj DUE: 12/02/23

## 2023-10-30 NOTE — Telephone Encounter (Signed)
Prior Auth REQUIRED for [employer]  PA PROCESS DETAILS: PA is required. Call [phone] or complete the PA  form available at  [email or web address]   Fax  completed form to [phone]. Or submit PA request online at [email or web address]

## 2023-11-02 NOTE — Telephone Encounter (Signed)
Prior Auth on file and valid:   Prior Auth APPROVED PA# [id number]  Valid: 05/03/23-05/02/24

## 2023-11-12 NOTE — Telephone Encounter (Signed)
Pt ready for scheduling on or after 12/02/23  Out-of-pocket cost due at time of visit: $0  Primary: [employer] Adv ESA PPO Prolia co-insurance: 0% Admin fee co-insurance: 0%  Deductible: does not apply  Prior Auth APPROVED PA# [id number]  Valid: 05/03/23-05/02/24  Secondary: N/A Prolia co-insurance:  Admin fee co-insurance:  Deductible:  Prior Auth:  PA# Valid:   ** This summary of benefits is an estimation of the patient's out-of-pocket cost. Exact cost may vary based on individual plan coverage.

## 2023-12-07 ENCOUNTER — Ambulatory Visit: Payer: Medicare HMO

## 2023-12-14 ENCOUNTER — Ambulatory Visit: Payer: Medicare HMO

## 2023-12-14 VITALS — BP 142/80 | HR 78 | Resp 20 | Ht 62.0 in | Wt 215.2 lb

## 2023-12-14 DIAGNOSIS — M810 Age-related osteoporosis without current pathological fracture: Secondary | ICD-10-CM

## 2023-12-14 MED ORDER — DENOSUMAB 60 MG/ML ~~LOC~~ SOSY: 60.0000 mg | PREFILLED_SYRINGE | Freq: Once | SUBCUTANEOUS | Status: AC

## 2023-12-14 MED ORDER — DENOSUMAB 60 MG/ML ~~LOC~~ SOSY: 60.0000 mg | PREFILLED_SYRINGE | SUBCUTANEOUS | Status: AC

## 2023-12-14 MED ORDER — DENOSUMAB 60 MG/ML ~~LOC~~ SOSY
60.0000 mg | PREFILLED_SYRINGE | Freq: Once | SUBCUTANEOUS | Status: AC
  Administered 2023-12-14: 60 mg via SUBCUTANEOUS

## 2023-12-14 NOTE — Progress Notes (Signed)
 After obtaining consent, and per orders of Dr. Elvera Lennox, injection of Prolia given by Tera Partridge. Patient instructed to remain in clinic for 20 minutes afterwards, and to report any adverse reaction to me immediately.

## 2023-12-16 NOTE — Telephone Encounter (Signed)
 Last Prolia inj 12/14/23 Next Prolia inj due 06/13/24

## 2023-12-26 LAB — HM DIABETES EYE EXAM

## 2023-12-29 ENCOUNTER — Other Ambulatory Visit: Payer: Self-pay | Admitting: Internal Medicine

## 2024-01-21 ENCOUNTER — Encounter: Payer: Self-pay | Admitting: Internal Medicine

## 2024-01-21 ENCOUNTER — Ambulatory Visit (INDEPENDENT_AMBULATORY_CARE_PROVIDER_SITE_OTHER): Payer: Medicare HMO | Admitting: Internal Medicine

## 2024-01-21 VITALS — BP 130/80 | HR 83 | Ht 62.0 in | Wt 214.6 lb

## 2024-01-21 DIAGNOSIS — M810 Age-related osteoporosis without current pathological fracture: Secondary | ICD-10-CM | POA: Diagnosis not present

## 2024-01-21 DIAGNOSIS — Z7984 Long term (current) use of oral hypoglycemic drugs: Secondary | ICD-10-CM

## 2024-01-21 DIAGNOSIS — E1142 Type 2 diabetes mellitus with diabetic polyneuropathy: Secondary | ICD-10-CM | POA: Diagnosis not present

## 2024-01-21 NOTE — Progress Notes (Signed)
 Patient ID: Holly Fields, female   DOB: October 26, 1941, 83 y.o.   MRN: [id number]   HPI  Holly Fields is a 83 y.o.-year-old female, initially referred by her PCP, Dr. Zachery Dauer, returning for follow-up for osteoporosis and DM2.  Last visit 6 months ago.  Interim history: No falls or fractures since last visit. No dizziness, vertigo, disequilibrium.  She has very painful sciatica - sees pain management. Also, neuropathy - back on ALA and now on Lyrica - but not taking it consistently b/c dizziness, dysequilibrium.  Osteoporosis:  She was diagnosed with osteoporosis in 2015.  Previously osteopenia.  Reviewed patient's DXA scan reports: DXA (03/02/2023) - [facility]  Lumbar spine L1-L4 Femoral neck (FN) Total femur 33% right distal radius  T-score  N/a RFN: -1.5 LFN: -1.3 RTF: -1.1 LTF: -1.1 -2.8   10/18/2020 Lumbar spine L1-L4 Femoral neck (FN)  T-score  -0.5 (<< -1.1) RFN: -1.8 LFN: -1.8   09/06/2018 ([facility]) Lumbar spine L1-L4 Femoral neck (FN)  T-score  -1.20 RFN: -2.20  LFN: -2.00  Change in BMD from previous DXA test (%) 16% increase  2% increase in RFN 5% increase in LFN   Date L1-L4 T score FN T score  09/04/2016 [facility]) -2.5 (-7.9%*) RFN: -2.3 LFN: -2.2  08/18/2014 ([facility]) -1.8 RFN: -2.5 LFN: -2.1   Previous osteoporosis treatments:  - Fosamax >> started 10/2016 >> swelling on tongue, lips + dysphagia + skin rash (painful) + generalized pain - Prolia: 05/15/2017 11/21/2017 07/03/2018 02/27/2019 09/10/2019 04/07/2020 11/02/2020  05/13/2021 11/22/2021 05/24/2022 11/29/2022 06/01/2023 12/14/2023  No vitamin D deficiency: Lab Results  Component Value Date   VD25OH 56.50 09/12/2022   VD25OH 46.50 04/12/2017  10/11/2020: 54.2 08/19/2019:  45 08/29/2016: 48.2 04/25/2016: 53.1 08/26/2015: 45.9  She is on vitamin D 2000 units daily  She does not take high vitamin A doses.  She had multiple steroid injections in the knees stopped in 2016, when she  had the last TKR.  She also had surgery for spinal stenosis and continues to have sciatica pain and numbness.  She also has lymphedema.  Her legs hurt at night.  She has been seen in the pain clinic before.  She had disequilibrium, now improved. She walks with a cane.  Menopause was at 83 years old.  She does not have a family history of osteoporosis.    No history of kidney stones.  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  She sees  sees Dr. Sabra Heck with nephrology.    Latest BUN/creatinine:  Lab Results  Component Value Date   BUN 21 06/14/2018   CREATININE 1.59 (H) 06/14/2018   DM2: - dx. In 2001 - Complicated by peripheral neuropathy (lymphedema may contribute) - saw neurology (Dr. Lucia Gaskins) - started  Lyrica 50 (could not tolerate 100 mg)  She is on: - Januvia 50 mg daily >> Rybelsus >> Januvia 50 mg daily She was on Farxiga 5 mg daily in a.m. - started in 03/2022, but >> advised her to stop 2/2 potential for UTIs /yeast infections.  HbA1c levels reviewed: 01/15/2024: HbA1c 6.4% 09/18/2023: HbA1c 6.6% Lab Results  Component Value Date   HGBA1C 6.1 07/19/2023   HGBA1C 6.2 (A) 01/16/2023   HGBA1C 5.9 (A) 09/12/2022  03/27/2022:  HbA1c 7.2% 07/15/2021: HbA1c 7.0% 10/11/2020: HbA1c 6.8%  She checks CBGs 1x a day per review of her CBG log:  - am: 94-121 >> 95-120, 134 >> 84-121>> 106-123 - 2h after b'fast: n/c >> 114 >> n/c - lunch: n/c >> 103 >> 99-121, 133 >> 135 >> n/c - 2h after lunch: 100 >> 121-129 >> n/c - dinner: 100-127 >> 84-136 >> 91-139 >> n/c - 2h after dinner: 95-126 >> n/c - bedtime: 104-134 >> n/c  Meter: One Touch Verio  +HL:  Lab Results  Component Value Date   CHOL 149 09/20/2018   HDL 51 09/20/2018   LDLCALC 68 09/20/2018   TRIG 146 09/20/2018  She is on Lipitor 20 mg daily (increased after the above results returned).  Started Valsartan 80 in 09/2023.  Latest eye exam: 12/26/2023: No.Had cataract sx.  She has peripheral neuropathy and is on Lyrica 25 mg daily.  She sees neurology.  She also has a history of GERD with history of reflux esophagitis.  Also, fatigue, constipation. No history of pancreatitis.  ROS: + See HPI  I reviewed pt's medications, allergies, PMH, social hx, family hx:  Past Medical History:  Diagnosis Date   Angio-edema    Arthritis    CKD (chronic kidney disease), stage III (HCC)     saw dr Lowell Guitar 2 years ago, now released from nephrology   Complication of anesthesia 2010   "sluggish after anesthesia and had vertigo", slow to awaken after knee artroscopy 2012   Diabetes (HCC)    Eczema    at times   GERD (gastroesophageal reflux disease)    HTN (hypertension)    Hypercholesteremia    Lymphedema of leg    right more than RIGHT   Narrowing of airway 2010   Neuropathy    Numbness and tingling of right leg    Osteoarthritis    Osteopenia    Osteoporosis    PONV (postoperative nausea and vomiting)    pt has n/v and vertigo after anesthesia   PSVT (paroxysmal supraventricular tachycardia) (HCC)    Recurrent upper respiratory infection (URI)    Sinus tachycardia    Vertigo    at times, cannot turn on left side quickly or sleep on left side    Vitamin D deficiency    Past Surgical History:  Procedure Laterality Date   arthroscopic knee surgery Left 2012   CARDIAC CATHETERIZATION  2012   JOINT REPLACEMENT Right 2010   LUMBAR LAMINECTOMY/DECOMPRESSION MICRODISCECTOMY N/A 10/22/2013   Procedure: MICROLUMBAR DECOMPRESSION LUMBAR TWO TO THREE, LUMBAR THREE TO FOUR and faramenotomy two,threeand four ,five bilateral;  Surgeon: Javier Docker, MD;  Location: [facility];  Service: Orthopedics;  Laterality: N/A;   NASAL SINUS SURGERY  yrs ago    x 2   TOTAL KNEE ARTHROPLASTY Left 03/01/2015   Procedure: LEFT TOTAL KNEE ARTHROPLASTY;  Surgeon: Ollen Gross, MD;  Location: [facility];  Service: Orthopedics;  Laterality: Left;   Social History   Socioeconomic History   Marital status:  Married    Spouse name: Not on file   Number of children: 1   Years of education: Not on file   Highest education level: Not on file  Occupational History    Comment: retired  Tobacco Use   Smoking status: Former    Current packs/day: 0.00    Average packs/day: 0.3 packs/day for 15.0 years (3.8 ttl pk-yrs)    Types: Cigarettes    Start date: 12/04/1961    Quit date: 12/04/1976    Years since quitting: 47.1   Smokeless tobacco: Never  Vaping Use   Vaping status: Never Used  Substance and Sexual Activity   Alcohol use: Yes    Comment: very occasional wine   Drug use: No   Sexual activity: Not on file  Other Topics Concern   Not on file  Social History Narrative   Caffeine- 1 drink 3 times per week (usually drinks decaf)   Left handed    Social Drivers of Corporate investment banker Strain: Not on file  Food Insecurity: Not on file  Transportation Needs: Not on file  Physical Activity: Not on file  Stress: Not on file  Social Connections: Not on file  Intimate Partner Violence: Not on file   Current Outpatient Medications on File Prior to Visit  Medication Sig Dispense Refill   atorvastatin (LIPITOR) 20 MG tablet Take 20 mg by mouth daily.      Bacillus Coagulans-Inulin (ALIGN PREBIOTIC-PROBIOTIC) 5-1.25 MG-GM CHEW daily.     Cholecalciferol (VITAMIN D3) 50 MCG (2000 UT) capsule Take 2,000 Units by mouth daily.     denosumab (PROLIA) 60 MG/ML SOSY injection Inject 60 mg into the skin every 6 (six) months.     fexofenadine (ALLEGRA) 180 MG tablet Take 1 tablet by mouth as needed.     fluticasone (FLONASE) 50 MCG/ACT nasal spray Place 2 sprays into the nose as needed for allergies.     glucose blood (ONETOUCH VERIO) test strip 100 each by Other route daily. 100 each 3   hydrochlorothiazide (HYDRODIURIL) 25 MG tablet Take 1 tablet (25 mg total) by mouth daily. (Patient taking differently: Take 25 mg by mouth 3 (three) times a week.) 90 tablet 3   JANUVIA 50 MG tablet TAKE 1 TABLET DAILY 90 tablet 3   lansoprazole (PREVACID SOLUTAB) 15 MG disintegrating tablet Take 15 mg by mouth daily.     metoprolol succinate (TOPROL-XL) 25 MG 24 hr tablet Take 1 tablet (25 mg total) by mouth daily. Take with or immediately following a meal. 90 tablet 3   NON FORMULARY Nervolink     OneTouch Delica Lancets 33G MISC See admin instructions.     pantoprazole (PROTONIX) 20 MG tablet Take 20 mg by mouth 2 (two) times daily.     Probiotic Product (ALIGN PO) Take by mouth.     Simethicone (GAS-X EXTRA STRENGTH) 125 MG CAPS as needed.     triamcinolone ointment (KENALOG) 0.1 % as needed.     valsartan (DIOVAN) 80 MG tablet Take 80 mg by mouth daily.     Current Facility-Administered Medications on File Prior to Visit  Medication Dose Route Frequency Provider Last Rate Last Admin   denosumab (PROLIA) injection 60 mg  60 mg Subcutaneous Once Carlus Pavlov, MD       Melene Muller ON 06/11/2024] denosumab (PROLIA) injection 60 mg  60 mg Subcutaneous Q6 months Carlus Pavlov, MD       Allergies  Allergen Reactions   Adenosine  bp plummets   Augmentin [Amoxicillin-Pot Clavulanate] Hives   Codeine Nausea And Vomiting   Doxycycline Hives   Fosamax  [Alendronate] Swelling    Lips swell   Gabapentin Other (See Comments)    "psychotic" reactions   Hydrocodone-Acetaminophen     "out of it"   Hydromorphone Hcl Nausea And Vomiting   Lactose Intolerance (Gi)    Metformin And Related     diarrhea   Other Other (See Comments)   Oxycodone Nausea And Vomiting   Sulfa Antibiotics     Acute kidney injury   Sulfamethoxazole-Trimethoprim Other (See Comments)   Tramadol Nausea And Vomiting   Family History  Problem Relation Age of Onset   Arrhythmia Mother    Hyperlipidemia Mother    Hypertension Mother    Allergic rhinitis Mother    Arrhythmia Father    Heart attack Father    Heart failure Father    PE: BP 130/80   Pulse 83   Ht 5\' 2"  (1.575 m)   Wt 214 lb 9.6 oz (97.3 kg)   SpO2 99%   BMI 39.25 kg/m  Wt Readings from Last 3 Encounters:  01/21/24 214 lb 9.6 oz (97.3 kg)  12/14/23 215 lb 3.2 oz (97.6 kg)  09/25/23 212 lb 12.8 oz (96.5 kg)   Constitutional: overweight, in NAD Eyes: EOMI, no exophthalmos ENT: no thyromegaly, no cervical lymphadenopathy Cardiovascular: RRR, No MRG, ++ bilateral significant lymphedema Respiratory: CTA B Musculoskeletal: no deformities Skin: no rashes Neurological: no tremor with outstretched hands  Assessment: 1.  Osteoporosis  2. DM2, non-insulin-dependent  3. Jittery feeling  Plan: 1. Osteoporosis -Likely postmenopausal +/- due to steroid injections -Patient's bone density T-scores improved from 2017 (when the lowest T-score was <-2.5) over the following checks up to 2021.  The improvement was evident both the lumbar spine and the femoral necks.  This was likely due to Prolia injections.  The latest bone density scan was checked in 02/2023.  The T-scores appears to be improved at the hips but she had a T-score of -2.8 at the 33% distal radius.  This was quite low, but I did not have a previous reading at this site to compare. -She continues on Prolia, tolerated well, without  thigh/hip/jaw pain.  She does have sciatica pain, which is chronic.  She was previously seen in the pain clinic -No falls or fractures since last visit.  She is at risk for falls due to neuropathy and significant leg swelling.  At last visits we discussed about the importance of exercising (weightbearing exercises) discussed about fall precautions.  Unfortunately, she is not able to exercise due to back pain/sciatica/neuropathy. -For now, we will plan to continue Prolia.  We discussed that none of the available osteoporotic changes have very good penetration in the distal radius and exercise is the best way to improve bone density at this site. 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  Prolia is not contraindicated in this case -Latest vitamin D level was normal, on 2000 units vitamin D daily: Lab Results  Component Value Date   VD25OH 56.50 09/12/2022  -Will have her return in 6 months  2. DM2 -She was previously on a DPP 4 inhibitor, then tried an SGLT2 inhibitor but developed increased urination and she also has a history of UTI and bladder incontinence on the nephrology recommended against the use of an SGLT2 inhibitor.  We tried to change from Januvia to Rybelsus but she had decreased appetite, nausea, diarrhea, and  weight loss of 20 pounds (also in the setting of antibiotics for UTI) so she went back to Januvia 50 mg daily.  Her HbA1c at last visit was at goal, slightly lower, at 6.1%.  Sugars are mostly at goal with only mildly occasional hyperglycemic values sometimes before meals but overall with good control.  We did not change the regimen. -At today's visit, sugars are at goal.  HbA1c obtained less than a week ago was lower, decreased from 6.6% to 6.4%.  Overall, these values are higher than obtained at last visit (6.1%), likely due to increased pain and lack of mobility. - I advised her to: Patient Instructions  Please continue Januvia 50 mg daily.   Please  continue Prolia.  Continue vitamin D 2000 units daily.  Please return for another visit in 6 months.  - advised to check sugars at different times of the day - 1x a day, rotating check times - advised for yearly eye exams >> she is UTD - return to clinic in 6 months  Carlus Pavlov, MD PhD [facility]

## 2024-01-21 NOTE — Patient Instructions (Signed)
 Please continue Januvia 50 mg daily.   Please continue Prolia.  Continue vitamin D 2000 units daily.  Please return for another visit in 6 months.

## 2024-04-10 ENCOUNTER — Ambulatory Visit
Admission: RE | Admit: 2024-04-10 | Discharge: 2024-04-10 | Disposition: A | Discharge status: Home / Self Care | Source: Ambulatory Visit | Attending: Student | Admitting: Student

## 2024-04-10 ENCOUNTER — Other Ambulatory Visit: Payer: Self-pay | Admitting: Student

## 2024-04-10 DIAGNOSIS — K5900 Constipation, unspecified: Secondary | ICD-10-CM

## 2024-05-27 NOTE — Telephone Encounter (Signed)
 Prolia  VOB initiated via [email or web address]  Next Prolia  inj DUE: 06/13/24

## 2024-06-10 NOTE — Telephone Encounter (Signed)
 [employer] [employer] - Prior Authorization REQUIRED for [employer]   PA PROCESS DETAILS: PA is required. Call [phone] or complete the PA form available at  [email or web address]   Fax completed form to [phone]. Or submit PA  request online at [email or web address]

## 2024-06-13 ENCOUNTER — Ambulatory Visit: Payer: Medicare HMO

## 2024-06-13 ENCOUNTER — Telehealth: Payer: Self-pay | Admitting: Internal Medicine

## 2024-06-13 VITALS — BP 138/70 | HR 79 | Resp 20 | Ht 62.0 in | Wt 214.0 lb

## 2024-06-13 DIAGNOSIS — M810 Age-related osteoporosis without current pathological fracture: Secondary | ICD-10-CM | POA: Diagnosis not present

## 2024-06-13 MED ORDER — DENOSUMAB 60 MG/ML ~~LOC~~ SOSY: 60.0000 mg | PREFILLED_SYRINGE | Freq: Once | SUBCUTANEOUS | Status: AC

## 2024-06-13 MED ORDER — DENOSUMAB 60 MG/ML ~~LOC~~ SOSY
60.0000 mg | PREFILLED_SYRINGE | Freq: Once | SUBCUTANEOUS | Status: AC
  Administered 2024-06-13: 60 mg via SUBCUTANEOUS

## 2024-06-13 NOTE — Telephone Encounter (Signed)
 Patient needs a new PA for her prolia  injections.

## 2024-06-13 NOTE — Progress Notes (Signed)
 After obtaining consent, and per orders of Dr. Elvera Lennox, injection of Prolia given by Tera Partridge. Patient instructed to remain in clinic for 20 minutes afterwards, and to report any adverse reaction to me immediately.

## 2024-06-17 NOTE — Telephone Encounter (Signed)
 Prior Authorization initiated for PROLIA  via [employer] Case ID: [id number]

## 2024-06-20 NOTE — Telephone Encounter (Signed)
 Last Prolia  inj 06/13/24 Next Prolia  inj due 12/15/24  Prior Authorization DOES NOT covered Prolia  DOS 06/13/24 - will need to request retro authorization.

## 2024-06-20 NOTE — Telephone Encounter (Signed)
 [employer]  Prior Authorization for PROLIA  APPROVED PA# [id number] Valid: 06/17/24-06/17/25

## 2024-06-25 NOTE — Telephone Encounter (Signed)
 Called Holly Fields / [facility] [employer] said they will not do backdated auths. She said that we need to submit the claim. After we receive the denial we can send in the [facility] Provider Appeals form with supporting documentation. She guided me to the correct form on the website. Once we have a claim number and denial, I will get everything faxed over.

## 2024-07-01 NOTE — Telephone Encounter (Signed)
 Not due until Jan 2026. Last Prolia  inj 06/13/24.

## 2024-07-02 NOTE — Telephone Encounter (Signed)
 Visit notes, prior auths, claim, and appeal form completed/printed and ready to fax once denial has been received.

## 2024-07-05 NOTE — Telephone Encounter (Signed)
 Information sent to [facility] in billing.

## 2024-07-22 ENCOUNTER — Encounter: Payer: Self-pay | Admitting: Internal Medicine

## 2024-07-22 ENCOUNTER — Ambulatory Visit (INDEPENDENT_AMBULATORY_CARE_PROVIDER_SITE_OTHER): Payer: Medicare HMO | Admitting: Internal Medicine

## 2024-07-22 VITALS — BP 136/80 | HR 95 | Ht 62.0 in | Wt 218.0 lb

## 2024-07-22 DIAGNOSIS — Z7984 Long term (current) use of oral hypoglycemic drugs: Secondary | ICD-10-CM | POA: Diagnosis not present

## 2024-07-22 DIAGNOSIS — E1142 Type 2 diabetes mellitus with diabetic polyneuropathy: Secondary | ICD-10-CM

## 2024-07-22 DIAGNOSIS — M810 Age-related osteoporosis without current pathological fracture: Secondary | ICD-10-CM

## 2024-07-22 NOTE — Patient Instructions (Signed)
 Please continue Januvia 50 mg daily.   Please continue Prolia.  Continue vitamin D 2000 units daily.  Please return for another visit in 6 months.

## 2024-07-22 NOTE — Progress Notes (Signed)
 Patient ID: Holly Fields, female   DOB: [birth year], 83 y.o.   MRN: [id number]   HPI  Holly Fields is a 83 y.o.-year-old female, initially referred by her PCP, Dr. Gwenn, returning for follow-up for osteoporosis and DM2.  Last visit 6 months ago.  Interim history: No falls or fractures since last visit. No dizziness, vertigo, disequilibrium.  She had very painful sciatica - saw pain management.  This is improved, though. She also has neuropathy - back on ALA. She has GERD, Irritable bowel - sees GI. Will have a breath test. She is stressed with her mother, who is 56 years old and is sick-on hospice now.  Osteoporosis:  She was diagnosed with osteoporosis in 2015.  Previously osteopenia.  Reviewed patient's DXA scan reports: DXA (03/02/2023) - [facility]  Lumbar spine L1-L4 Femoral neck (FN) Total femur 33% right distal radius  T-score  N/a RFN: -1.5 LFN: -1.3 RTF: -1.1 LTF: -1.1 -2.8   10/18/2020 Lumbar spine L1-L4 Femoral neck (FN)  T-score  -0.5 (<< -1.1) RFN: -1.8 LFN: -1.8   09/06/2018 ([facility]) Lumbar spine L1-L4 Femoral neck (FN)  T-score  -1.20 RFN: -2.20  LFN: -2.00  Change in BMD from previous DXA test (%) 16% increase  2% increase in RFN 5% increase in LFN   Date L1-L4 T score FN T score  09/04/2016 [facility]) -2.5 (-7.9%*) RFN: -2.3 LFN: -2.2  08/18/2014 [facility]) -1.8 RFN: -2.5 LFN: -2.1   Previous osteoporosis treatments:  - Fosamax >> started 10/2016 >> swelling on tongue, lips + dysphagia + skin rash (painful) + generalized pain - Prolia : 05/15/2017 11/21/2017 07/03/2018 02/27/2019 09/10/2019 04/07/2020 11/02/2020  05/13/2021 11/22/2021 05/24/2022 11/29/2022 06/01/2023 12/14/2023  06/13/2024  No vitamin D  deficiency:  Lab Results  Component Value Date   VD25OH 56.50 09/12/2022   VD25OH 46.50 04/12/2017  10/11/2020: 54.2 08/19/2019:  45 08/29/2016: 48.2 04/25/2016: 53.1 08/26/2015: 45.9  She is on vitamin D  2000 units  daily  She does not take high vitamin A doses.  She had multiple steroid injections in the knees stopped in 2016, when she had the last TKR.  She also had surgery for spinal stenosis and continues to have sciatica pain and numbness.  She also has lymphedema.  Her legs hurt at night.  She has been seen in the pain clinic before.  She had disequilibrium, now improved. She walks with a cane.  Menopause was at 83 years old.  She does not have a family history of osteoporosis.    No history of kidney stones.  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  She sees  sees Dr. Bernardino Gasman with nephrology.    Latest BUN/creatinine: 07/15/2024: Cr 1.66, GFR 31, glucose 110 01/16/2024: 28/1.78, GFR 28,  glucose 132  Lab Results  Component Value Date   BUN 21 06/14/2018   CREATININE 1.59 (H) 06/14/2018   DM2: - dx. In 2001 - Complicated by peripheral neuropathy (lymphedema may contribute) - saw neurology (Dr. Ines) - started Lyrica  50 (could not tolerate 100 mg)  She is on: - Januvia  50 mg daily >> Rybelsus  >> Januvia  50 mg daily She was on Farxiga 5 mg daily in a.m. - started in 03/2022, but >> advised her to stop 2/2  potential for UTIs /yeast infections.  HbA1c levels reviewed: 07/15/2024: HbA1c 6.6% 03/20/2024: HbA1c 6.9% 01/15/2024: HbA1c 6.4% 09/18/2023: HbA1c 6.6% Lab Results  Component Value Date   HGBA1C 6.1 07/19/2023   HGBA1C 6.2 (A) 01/16/2023   HGBA1C 5.9 (A) 09/12/2022  03/27/2022: HbA1c 7.2% 07/15/2021: HbA1c 7.0% 10/11/2020: HbA1c 6.8%  She checks CBGs 1x a day per review of her CBG log:  - am: 95-120, 134 >> 84-121>> 106-123 >> 110-120 - 2h after b'fast: n/c >> 114 >> n/c - lunch: n/c >> 103 >> 99-121, 133 >> 135 >> n/c - 2h after lunch: 100 >> 121-129 >> n/c - dinner: 100-127 >> 84-136 >> 91-139 >> n/c - 2h after dinner: 95-126 >> n/c - bedtime: 104-134 >> n/c  Meter: One Touch Verio  +HL:  Lab Results  Component Value Date   CHOL 149 09/20/2018   HDL 51 09/20/2018   LDLCALC 68 09/20/2018   TRIG 146 09/20/2018  She is on Lipitor 20 mg daily.  Started Valsartan 80 in 09/2023.  Latest eye exam: 12/26/2023: No.Had cataract sx.  She has peripheral neuropathy and was on Lyrica  and Gabapentin - would not want to restart - on ALA and B vitamins now.  She saw neurology - not anymore.  She also has a history of GERD with history of reflux esophagitis.  Also, fatigue, constipation. No history of pancreatitis.  ROS: + See HPI  I reviewed pt's medications, allergies, PMH, social hx, family hx:  Past Medical History:  Diagnosis Date   Angio-edema    Arthritis    CKD (chronic kidney disease), stage III (HCC)     saw dr perri 2 years ago, now released from nephrology   Complication of anesthesia 2010   sluggish after anesthesia and had vertigo, slow to awaken after knee artroscopy 2012   Diabetes (HCC)    Eczema    at times   GERD (gastroesophageal reflux disease)    HTN (hypertension)    Hypercholesteremia    Lymphedema of leg    right more than RIGHT   Narrowing of airway 2010   Neuropathy    Numbness and tingling of right leg    Osteoarthritis    Osteopenia     Osteoporosis    PONV (postoperative nausea and vomiting)    pt has n/v and vertigo after anesthesia   PSVT (paroxysmal supraventricular tachycardia) (HCC)    Recurrent upper respiratory infection (URI)    Sinus tachycardia    Vertigo    at times, cannot turn on left side quickly or sleep on left side   Vitamin D  deficiency    Past Surgical History:  Procedure Laterality Date   arthroscopic knee surgery Left 2012   CARDIAC CATHETERIZATION  2012   JOINT REPLACEMENT Right 2010   LUMBAR LAMINECTOMY/DECOMPRESSION MICRODISCECTOMY N/A 10/22/2013   Procedure: MICROLUMBAR DECOMPRESSION LUMBAR TWO TO THREE, LUMBAR THREE TO FOUR and faramenotomy two,threeand four ,five bilateral;  Surgeon: Reyes JAYSON Billing, MD;  Location: [facility];  Service: Orthopedics;  Laterality: N/A;   NASAL SINUS SURGERY  yrs ago    x 2   TOTAL KNEE ARTHROPLASTY Left 03/01/2015   Procedure: LEFT TOTAL KNEE ARTHROPLASTY;  Surgeon: Dempsey Moan, MD;  Location: [facility];  Service: Orthopedics;  Laterality: Left;   Social History   Socioeconomic History   Marital status: Married    Spouse name: Not on file   Number of children: 1   Years of education: Not on file   Highest education level: Not on file  Occupational History    Comment: retired  Tobacco Use   Smoking status: Former    Current packs/day: 0.00    Average packs/day: 0.3 packs/day for 15.0 years (3.8 ttl pk-yrs)    Types: Cigarettes    Start date: 12/04/1961    Quit date: 12/04/1976    Years since quitting: 47.6   Smokeless tobacco: Never  Vaping Use   Vaping status: Never Used  Substance and Sexual Activity   Alcohol use: Yes    Comment: very occasional wine   Drug use: No   Sexual activity: Not on file  Other Topics Concern   Not on file  Social History Narrative   Caffeine- 1 drink 3 times per week (usually drinks decaf)   Left handed    Social Drivers of Corporate investment banker Strain: Not on file  Food Insecurity: Not on file   Transportation Needs: Not on file  Physical Activity: Not on file  Stress: Not on file  Social Connections: Not on file  Intimate Partner Violence: Not on file   Current Outpatient Medications on File Prior to Visit  Medication Sig Dispense Refill   atorvastatin  (LIPITOR) 20 MG tablet Take 20 mg by mouth daily.     Bacillus Coagulans-Inulin (ALIGN PREBIOTIC-PROBIOTIC) 5-1.25 MG-GM CHEW daily.     Cholecalciferol (VITAMIN D3) 50 MCG (2000 UT) capsule Take 2,000 Units by mouth daily.     denosumab  (PROLIA ) 60 MG/ML SOSY injection Inject 60 mg into the skin every 6 (six) months.     fexofenadine (ALLEGRA) 180 MG tablet Take 1 tablet by mouth as needed.     fluticasone  (FLONASE ) 50 MCG/ACT nasal spray Place 2 sprays into the nose as needed for allergies.     glucose blood (ONETOUCH VERIO) test strip 100 each by Other route daily. 100 each 3   hydrochlorothiazide  (HYDRODIURIL ) 25 MG tablet Take 1 tablet (25 mg total) by mouth daily. (Patient taking differently: Take 25 mg by mouth 3 (three) times a week.) 90 tablet 3   JANUVIA  50 MG tablet TAKE 1 TABLET DAILY 90 tablet 3   lansoprazole (PREVACID SOLUTAB) 15 MG disintegrating tablet Take 15 mg by mouth daily.     metoprolol  succinate (TOPROL -XL) 25 MG 24 hr tablet Take 1 tablet (25 mg total) by mouth daily. Take with or immediately following a meal. (Patient taking differently: Take 50 mg by mouth daily. Take with or immediately following a meal.) 90 tablet 3   NON FORMULARY Nervolink     OneTouch Delica Lancets 33G MISC See admin instructions.     pantoprazole  (PROTONIX ) 20 MG tablet Take 20 mg by mouth 2 (two) times daily.     Probiotic Product (ALIGN PO) Take by mouth.     Simethicone  (GAS-X EXTRA STRENGTH) 125 MG CAPS as needed.     triamcinolone  ointment (KENALOG ) 0.1 % as needed.     valsartan (DIOVAN) 80 MG tablet Take 80 mg by mouth daily.     Current  Facility-Administered Medications on File Prior to Visit  Medication Dose Route  Frequency Provider Last Rate Last Admin   denosumab  (PROLIA ) injection 60 mg  60 mg Subcutaneous Once Jazmine Heckman, MD       denosumab  (PROLIA ) injection 60 mg  60 mg Subcutaneous Q6 months Trixie File, MD       NOREEN ON 12/14/2024] denosumab  (PROLIA ) injection 60 mg  60 mg Subcutaneous Once Nary Sneed, MD       Allergies  Allergen Reactions   Adenosine       bp plummets   Augmentin [Amoxicillin-Pot Clavulanate] Hives   Codeine Nausea And Vomiting   Doxycycline Hives   Fosamax [Alendronate] Swelling    Lips swell   Gabapentin Other (See Comments)    psychotic reactions   Hydrocodone -Acetaminophen      out of it   Hydromorphone  Hcl Nausea And Vomiting   Lactose Intolerance (Gi)    Metformin And Related     diarrhea   Other Other (See Comments)   Oxycodone Nausea And Vomiting   Sulfa Antibiotics     Acute kidney injury   Sulfamethoxazole-Trimethoprim Other (See Comments)   Tramadol Nausea And Vomiting   Family History  Problem Relation Age of Onset   Arrhythmia Mother    Hyperlipidemia Mother    Hypertension Mother    Allergic rhinitis Mother    Arrhythmia Father    Heart attack Father    Heart failure Father    PE: BP 136/80 (BP Location: Left Arm, Patient Position: Sitting, Cuff Size: Large)   Pulse 95   Ht 5' 2 (1.575 m)   Wt 218 lb (98.9 kg)   SpO2 94%   BMI 39.87 kg/m  Wt Readings from Last 3 Encounters:  07/22/24 218 lb (98.9 kg)  06/13/24 214 lb (97.1 kg)  01/21/24 214 lb 9.6 oz (97.3 kg)   Constitutional: overweight, in NAD Eyes: EOMI, no exophthalmos ENT: no thyromegaly, no cervical lymphadenopathy Cardiovascular: RRR, No MRG, ++ bilateral significant lymphedema Respiratory: CTA B Musculoskeletal: no deformities Skin: no rashes Neurological: no tremor with outstretched hands  Assessment: 1.  Osteoporosis  2. DM2, non-insulin -dependent  3. Jittery feeling  Plan: 1. Osteoporosis - Likely postmenopausal +/- due to steroid  injections. -Patient's bone density T-scores improved from 2017 (when the lowest T-score was <-2.5) over the following checks up to 2021.  The improvement was evident both the lumbar spine and the femoral necks.  This was likely due to Prolia  injections.  The latest bone density scan was checked in 02/2023.  The T-scores appears to be improved at the hips but she had a T-score of -2.8 at the 33% distal radius.  This was quite low, but I did not have a previous reading at this site to compare. - She tolerates Prolia  well, without thigh/hip/jaw pain.  She does have sciatica pain, which is chronic.  She was previously seen in the pain clinic - No falls or fractures since last visit.  She is at risk for falls due to neuropathy and significant leg swelling.  We previously discussed about the importance of exercising with the benefit from weightbearing exercises but unfortunately, she is not able to exercise due to back pain/sciatica/neuropathy. - Will continue with Prolia  every 6 months.  We discussed that none of the available osteoporotic changes have very good penetration in the distal radius and exercise is the best way to improve bone density at this site. - She has intermittent hypercalcemia, most likely secondary to her kidney disease.  Latest GFR was 28 and corrected calcium  was 10.06, normal. -  Vitamin D  level was normal, on 2000 units vitamin D  daily in 10/2023 - Will repeat her vitamin D  level at next annual physical exam with PCP, which is pending later in the  2. DM2 -Previously on a DPP 4 inhibitor then tried an SGLT2 inhibitor but developed increased urination.  She also has a history of UTIs And bladder incontinence and nephrology recommended against the use of an SGLT2 inhibitor.  We tried to switch from Januvia  to Rybelsus  but she developed decreased appetite, nausea, diarrhea, and weight loss of 20 pounds so she went back to Januvia  50 mg daily.  was higher, at 6.9% 4 months ago and she had  another HbA1c in the last month, which was lower, at 6.4%. - At today's visit, sugars remain at goal.  We can continue the same regimen for now. - I advised her to: Patient Instructions  Please continue Januvia  50 mg daily.   Please continue Prolia .  Continue vitamin D  2000 units daily.  Please return for another visit in 6 months.  - advised to check sugars at different times of the day - 1x a day, rotating check times - advised for yearly eye exams >> she is UTD - she is due for a foot exam.  She has long compression hoses on and we discussed about checking foot exam at next visit-not wearing the compression hoses at that time - return to clinic in 6 months  Lela Fendt, MD PhD [facility]

## 2024-08-04 NOTE — Telephone Encounter (Signed)
 Last Prolia  inj 06/13/24 Next Prolia  inj due 12/15/24

## 2024-10-21 ENCOUNTER — Ambulatory Visit: Attending: Cardiology | Admitting: Cardiology

## 2024-10-21 ENCOUNTER — Encounter: Payer: Self-pay | Admitting: Cardiology

## 2024-10-21 VITALS — BP 164/78 | HR 77 | Ht 62.0 in | Wt 211.0 lb

## 2024-10-21 DIAGNOSIS — I471 Supraventricular tachycardia, unspecified: Secondary | ICD-10-CM | POA: Diagnosis not present

## 2024-10-21 DIAGNOSIS — N1832 Chronic kidney disease, stage 3b: Secondary | ICD-10-CM

## 2024-10-21 DIAGNOSIS — I890 Lymphedema, not elsewhere classified: Secondary | ICD-10-CM | POA: Diagnosis not present

## 2024-10-21 DIAGNOSIS — I10 Essential (primary) hypertension: Secondary | ICD-10-CM | POA: Diagnosis not present

## 2024-10-21 NOTE — Patient Instructions (Signed)

## 2024-10-21 NOTE — Progress Notes (Signed)
 Cardiology Office Note:  .   Date:  10/21/2024  ID:  Holly Fields, DOB [birth year], MRN [id number] PCP: Dyane Anthony RAMAN, FNP  [facility] Providers Cardiologist:  Oneil Parchment, MD     History of Present Illness: Holly   Holly Fields is a 83 y.o. female Discussed the use of AI scribe software  History of Present Illness Holly Fields is an 83 year old female with PSVT who presents for a follow-up visit.  She experienced a single episode of paroxysmal supraventricular tachycardia (PSVT) following a stressful event, characterized by a rapid heart rate and dizziness. She was treated in the emergency room and has not had any recurrence since then. She is currently taking Toprol  XL 25 mg daily.  She has chronic kidney disease, stage 3A, with a creatinine level of 1.8 in April. She is on a program to monitor her kidneys and blood pressure, which she checks twice daily. Her blood pressure readings vary, with a recent home reading of 136/unknown, but today's reading was 164/78, which she attributes to walking before the appointment. She takes valsartan 80 mg daily, which was recently increased.  She has hypertension and is on hydrochlorothiazide  25 mg three times a week. Her blood pressure management is ongoing, with some days being better than others.  She has hyperlipidemia and is taking atorvastatin  20 mg daily. Her LDL was 110.  She has type 2 diabetes with a recent hemoglobin A1c of 6.6.  She experiences lymphedema and neuropathy, which contribute to her pain and impact her mobility. The pain 'takes a lot of my energy' and makes it harder to get around.  She reports hearing her heartbeat in her ears, especially when lying down, but denies any recent episodes of racing heartbeats.       Studies Reviewed: Holly   EKG Interpretation Date/Time:  Tuesday October 21 2024 10:52:32 EST Ventricular Rate:  77 PR Interval:  150 QRS Duration:  116 QT Interval:  402 QTC Calculation: 454 R  Axis:   -8  Text Interpretation: Normal sinus rhythm Right bundle branch block  Poor R wave progression When compared with ECG of 25-Sep-2023 09:49,  No significant change since last tracing  Confirmed by Parchment Oneil ([id number]) on 10/21/2024 10:54:56 AM    Results LABS Hemoglobin A1c: 6.6 Creatinine: 1.8 (03/2024) LDL: 110  RADIOLOGY Lower extremity venous Doppler: No evidence of DVT (2023) Risk Assessment/Calculations:           Physical Exam:   VS:  BP (!) 164/78   Pulse 77   Ht 5' 2 (1.575 m)   Wt 211 lb (95.7 kg)   SpO2 96%   BMI 38.59 kg/m    Wt Readings from Last 3 Encounters:  10/21/24 211 lb (95.7 kg)  07/22/24 218 lb (98.9 kg)  06/13/24 214 lb (97.1 kg)    GEN: Well nourished, well developed in no acute distress, cane NECK: No JVD; No carotid bruits CARDIAC: RRR, no murmurs, no rubs, no gallops RESPIRATORY:  Clear to auscultation without rales, wheezing or rhonchi  ABDOMEN: Soft, non-tender, non-distended EXTREMITIES:  No edema; No deformity   ASSESSMENT AND PLAN: .    Assessment and Plan Assessment & Plan Paroxysmal supraventricular tachycardia (PSVT) Single episode of PSVT following a stressful event, characterized by rapid heart rate and dizziness. Treated in the emergency room with no recurrence since then. Currently on Toprol  XL 25 mg daily. - Continue Toprol  XL 25 mg daily.  Chronic kidney disease, stage 3A  Chronic kidney disease stage 3A with creatinine level of 1.8 in April. Under the care of nephrologist Dr. Marlee. - Continue monitoring kidney function with nephrologist.  Hypertension Blood pressure recorded at 164/78 in the office, likely elevated due to recent physical activity. Home readings are generally better, with a recent reading of 136. Currently on Valsartan 80 mg daily and Hydrochlorothiazide  25 mg three times a week. - Continue Valsartan 80 mg daily. - Continue Hydrochlorothiazide  25 mg three times a week. - Monitor blood pressure  at home.  Type 2 diabetes mellitus Hemoglobin A1c is 6.6, indicating good control of diabetes. - Continue current diabetes management plan.  Hyperlipidemia LDL cholesterol level is 110. Currently on Atorvastatin  20 mg daily. - Continue Atorvastatin  20 mg daily.  Lymphedema Reports pain and difficulty with mobility, likely related to lymphedema as well as osteoarthritis. - Continue current management plan.          Dispo: 1 yr  Signed, Oneil Parchment, MD

## 2024-11-11 NOTE — Telephone Encounter (Signed)
 Prolia  VOB initiated via [email or web address]  Next Prolia  inj DUE: 12/15/24

## 2024-11-14 NOTE — Telephone Encounter (Signed)
 [employer] - Prior Authorization REQUIRED for [employer]

## 2024-11-17 NOTE — Telephone Encounter (Signed)
 Prior Authorization on file.     [employer]   Prior Authorization for PROLIA  APPROVED PA# [id number] Valid: 06/17/24-06/17/25

## 2024-12-17 ENCOUNTER — Other Ambulatory Visit: Payer: Self-pay | Admitting: Internal Medicine

## 2024-12-21 NOTE — Telephone Encounter (Signed)
 The primary preferred product for your patient's health plan for requests for the treatment of osteoporosis is zoledronic acid. If the patient's therapy can be switched please select a preferred product.  Continuation of therapy  Prior Authorization initiated for PROLIA  via [employer] Case ID: [id number]      This request has duplicate/overlapping dates of service. Please void this request and modify the original authorization.( Overlap of Auth [id number])

## 2024-12-30 NOTE — Telephone Encounter (Signed)
 This request has duplicate/overlapping dates of service. Please void this request and modify the original authorization.( Overlap of Auth [id number])  [employer]  Prior Authorization for PROLIA  APPROVED PA# [id number] Valid: 06/17/24-06/17/25

## 2024-12-30 NOTE — Telephone Encounter (Signed)
 [employer]  Patient is ready for scheduling on or after 12/15/24  Out-of-pocket cost due at time of visit: $0  Primary: [employer] Advantage ESA PPO Prolia  co-insurance: 20% (approximately $377.56) Admin fee co-insurance: 20% (approximately $25)  Deductible: does not apply  Prior Auth: APPROVED PA# [id number] Valid: 06/17/24-06/17/25  Secondary: N/A Prolia  co-insurance:  Admin fee co-insurance:  Deductible:  Prior Auth:  PA# Valid:   ** This summary of benefits is an estimation of the patient's out-of-pocket cost. Exact cost may vary based on individual plan coverage.

## 2025-01-21 ENCOUNTER — Ambulatory Visit: Admitting: Internal Medicine
